# Patient Record
Sex: Male | Born: 1950 | Race: Black or African American | Hispanic: No | Marital: Married | Smoking: Never smoker
Health system: Southern US, Community
[De-identification: ages and names within clinical notes are randomized; demographics above are authoritative.]

## PROBLEM LIST (undated history)

## (undated) DIAGNOSIS — H35039 Hypertensive retinopathy, unspecified eye: Secondary | ICD-10-CM

## (undated) DIAGNOSIS — I10 Essential (primary) hypertension: Secondary | ICD-10-CM

## (undated) DIAGNOSIS — H269 Unspecified cataract: Secondary | ICD-10-CM

## (undated) DIAGNOSIS — I8290 Acute embolism and thrombosis of unspecified vein: Secondary | ICD-10-CM

## (undated) HISTORY — DX: Unspecified cataract: H26.9

## (undated) HISTORY — DX: Hypertensive retinopathy, unspecified eye: H35.039

## (undated) HISTORY — DX: Essential (primary) hypertension: I10

---

## 1999-08-08 ENCOUNTER — Ambulatory Visit (HOSPITAL_COMMUNITY): Admission: RE | Admit: 1999-08-08 | Discharge: 1999-08-08 | Payer: Self-pay | Admitting: Orthopaedic Surgery

## 2004-11-14 ENCOUNTER — Ambulatory Visit (HOSPITAL_COMMUNITY): Admission: RE | Admit: 2004-11-14 | Discharge: 2004-11-14 | Payer: Self-pay | Admitting: Internal Medicine

## 2006-01-29 ENCOUNTER — Ambulatory Visit: Payer: Self-pay | Admitting: Oncology

## 2006-02-22 LAB — CBC WITH DIFFERENTIAL/PLATELET
Basophils Absolute: 0 10*3/uL (ref 0.0–0.1)
EOS%: 2.1 % (ref 0.0–7.0)
Eosinophils Absolute: 0.1 10*3/uL (ref 0.0–0.5)
HGB: 13.1 g/dL (ref 13.0–17.1)
LYMPH%: 39 % (ref 14.0–48.0)
MCH: 26.4 pg — ABNORMAL LOW (ref 28.0–33.4)
MCV: 82.2 fL (ref 81.6–98.0)
MONO%: 9 % (ref 0.0–13.0)
NEUT#: 2.8 10*3/uL (ref 1.5–6.5)
NEUT%: 49.4 % (ref 40.0–75.0)
Platelets: 236 10*3/uL (ref 145–400)
RDW: 15.5 % — ABNORMAL HIGH (ref 11.2–14.6)

## 2006-03-03 LAB — BETA-2 GLYCOPROTEIN ANTIBODIES
Beta-2 Glyco I IgG: <4 U/mL (ref <20)
Beta-2-Glycoprotein I IgA: 34 U/mL (ref <10)
Beta-2-Glycoprotein I IgM: <4 U/mL (ref <10)

## 2006-03-03 LAB — FACTOR 5 LEIDEN

## 2006-03-03 LAB — LUPUS ANTICOAGULANT PANEL
DRVVT: 40.1 secs (ref 26.75–42.95)
PTT Lupus Anticoagulant: 39.8 secs (ref 30.5–43.1)

## 2006-03-03 LAB — PROTHROMBIN GENE MUTATION

## 2006-03-17 ENCOUNTER — Ambulatory Visit: Payer: Self-pay | Admitting: Oncology

## 2006-03-22 LAB — CBC & DIFF AND RETIC
BASO%: 0.5 % (ref 0.0–2.0)
Basophils Absolute: 0 10*3/uL (ref 0.0–0.1)
EOS%: 2.5 % (ref 0.0–7.0)
Eosinophils Absolute: 0.1 10*3/uL (ref 0.0–0.5)
HCT: 39.3 % (ref 38.7–49.9)
HGB: 12.7 g/dL — ABNORMAL LOW (ref 13.0–17.1)
IRF: 0.33 (ref 0.070–0.380)
LYMPH%: 43.2 % (ref 14.0–48.0)
MCH: 26.5 pg — ABNORMAL LOW (ref 28.0–33.4)
MCHC: 32.2 g/dL (ref 32.0–35.9)
MCV: 82.3 fL (ref 81.6–98.0)
MONO#: 0.4 10*3/uL (ref 0.1–0.9)
MONO%: 8.6 % (ref 0.0–13.0)
NEUT#: 2.2 10*3/uL (ref 1.5–6.5)
NEUT%: 45.2 % (ref 40.0–75.0)
Platelets: 230 10*3/uL (ref 145–400)
RBC: 4.78 10*6/uL (ref 4.20–5.71)
RDW: 14.8 % — ABNORMAL HIGH (ref 11.2–14.6)
RETIC #: 40.2 10*3/uL (ref 31.8–103.9)
Retic %: 0.8 % (ref 0.7–2.3)
WBC: 4.8 10*3/uL (ref 4.0–10.0)
lymph#: 2.1 10*3/uL (ref 0.9–3.3)

## 2006-03-22 LAB — CHCC SMEAR

## 2006-03-25 LAB — FERRITIN: Ferritin: 53 ng/mL (ref 22–322)

## 2006-03-25 LAB — BETA-2 GLYCOPROTEIN ANTIBODIES
Beta-2 Glyco I IgG: <4 U/mL (ref <20)
Beta-2-Glycoprotein I IgA: 6 U/mL (ref <10)
Beta-2-Glycoprotein I IgM: <4 U/mL (ref <10)

## 2006-04-29 LAB — CBC WITH DIFFERENTIAL/PLATELET
Basophils Absolute: 0 10*3/uL (ref 0.0–0.1)
Eosinophils Absolute: 0.1 10*3/uL (ref 0.0–0.5)
HGB: 13.1 g/dL (ref 13.0–17.1)
NEUT#: 2 10*3/uL (ref 1.5–6.5)
RBC: 4.94 10*6/uL (ref 4.20–5.71)
RDW: 14.7 % — ABNORMAL HIGH (ref 11.2–14.6)
WBC: 4.6 10*3/uL (ref 4.0–10.0)
lymph#: 2 10*3/uL (ref 0.9–3.3)

## 2006-04-29 LAB — CHCC SMEAR

## 2006-04-30 LAB — FERRITIN: Ferritin: 44 ng/mL (ref 22–322)

## 2006-05-06 LAB — LUPUS ANTICOAGULANT PANEL: PTT Lupus Anticoagulant: 36.1 secs — ABNORMAL LOW (ref 36.3–48.8)

## 2006-05-06 LAB — BETA-2 GLYCOPROTEIN ANTIBODIES
Beta-2 Glyco I IgG: 6 U/mL (ref <20)
Beta-2-Glycoprotein I IgA: 8 U/mL (ref <10)
Beta-2-Glycoprotein I IgM: <4 U/mL (ref <10)

## 2006-05-06 LAB — ANTITHROMBIN III: AntiThromb III Func: 101 % (ref 75–120)

## 2006-05-07 ENCOUNTER — Ambulatory Visit: Payer: Self-pay | Admitting: Oncology

## 2006-10-28 ENCOUNTER — Ambulatory Visit: Payer: Self-pay | Admitting: Oncology

## 2011-06-29 ENCOUNTER — Emergency Department (HOSPITAL_COMMUNITY): Payer: No Typology Code available for payment source

## 2011-06-29 ENCOUNTER — Emergency Department (HOSPITAL_COMMUNITY)
Admission: EM | Admit: 2011-06-29 | Discharge: 2011-06-29 | Disposition: A | Discharge status: Home / Self Care | Payer: No Typology Code available for payment source | Attending: Emergency Medicine | Admitting: Emergency Medicine

## 2011-06-29 ENCOUNTER — Encounter: Payer: Self-pay | Admitting: *Deleted

## 2011-06-29 DIAGNOSIS — M542 Cervicalgia: Secondary | ICD-10-CM | POA: Insufficient documentation

## 2011-06-29 DIAGNOSIS — Y92410 Unspecified street and highway as the place of occurrence of the external cause: Secondary | ICD-10-CM | POA: Insufficient documentation

## 2011-06-29 MED ORDER — KETOROLAC TROMETHAMINE 30 MG/ML IJ SOLN
30.0000 mg | Freq: Once | INTRAMUSCULAR | Status: AC
  Administered 2011-06-29: 30 mg via INTRAVENOUS
  Filled 2011-06-29: qty 1

## 2011-06-29 MED ORDER — OXYCODONE-ACETAMINOPHEN 5-325 MG PO TABS: 2.0000 | ORAL_TABLET | ORAL | Status: AC | PRN
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
## 2011-06-29 NOTE — ED Notes (Signed)
Pt c/o bilateral neck pain and L rib pain, R shoulder pain and bilateral hip pain. Pt ambulatory and in no acute distress.

## 2011-06-29 NOTE — ED Notes (Signed)
Dr Nino Parsley notified of elevated blood pressure,  Verbal order to obtain IV

## 2014-11-06 ENCOUNTER — Emergency Department (HOSPITAL_COMMUNITY): Payer: Self-pay

## 2014-11-06 ENCOUNTER — Inpatient Hospital Stay (HOSPITAL_COMMUNITY)
Admission: EM | Admit: 2014-11-06 | Discharge: 2014-11-08 | DRG: 580 | Disposition: A | Discharge status: Home / Self Care | Payer: Self-pay | Attending: Internal Medicine | Admitting: Internal Medicine

## 2014-11-06 ENCOUNTER — Encounter (HOSPITAL_COMMUNITY): Payer: Self-pay | Admitting: Emergency Medicine

## 2014-11-06 DIAGNOSIS — K921 Melena: Secondary | ICD-10-CM

## 2014-11-06 DIAGNOSIS — R55 Syncope and collapse: Secondary | ICD-10-CM | POA: Diagnosis present

## 2014-11-06 DIAGNOSIS — R195 Other fecal abnormalities: Secondary | ICD-10-CM | POA: Diagnosis present

## 2014-11-06 DIAGNOSIS — W3189XA Contact with other specified machinery, initial encounter: Secondary | ICD-10-CM

## 2014-11-06 DIAGNOSIS — D62 Acute posthemorrhagic anemia: Secondary | ICD-10-CM | POA: Diagnosis present

## 2014-11-06 DIAGNOSIS — E876 Hypokalemia: Secondary | ICD-10-CM | POA: Diagnosis present

## 2014-11-06 DIAGNOSIS — S61412A Laceration without foreign body of left hand, initial encounter: Principal | ICD-10-CM | POA: Diagnosis present

## 2014-11-06 DIAGNOSIS — I10 Essential (primary) hypertension: Secondary | ICD-10-CM | POA: Diagnosis present

## 2014-11-06 DIAGNOSIS — R030 Elevated blood-pressure reading, without diagnosis of hypertension: Secondary | ICD-10-CM

## 2014-11-06 DIAGNOSIS — K922 Gastrointestinal hemorrhage, unspecified: Secondary | ICD-10-CM | POA: Diagnosis present

## 2014-11-06 DIAGNOSIS — S61419A Laceration without foreign body of unspecified hand, initial encounter: Secondary | ICD-10-CM | POA: Diagnosis present

## 2014-11-06 DIAGNOSIS — IMO0001 Reserved for inherently not codable concepts without codable children: Secondary | ICD-10-CM | POA: Insufficient documentation

## 2014-11-06 DIAGNOSIS — I159 Secondary hypertension, unspecified: Secondary | ICD-10-CM

## 2014-11-06 DIAGNOSIS — I160 Hypertensive urgency: Secondary | ICD-10-CM | POA: Diagnosis present

## 2014-11-06 HISTORY — DX: Acute embolism and thrombosis of unspecified vein: I82.90

## 2014-11-06 LAB — CBC WITH DIFFERENTIAL/PLATELET
BASOS PCT: 0 % (ref 0–1)
Basophils Absolute: 0 10*3/uL (ref 0.0–0.1)
Eosinophils Absolute: 0.1 10*3/uL (ref 0.0–0.7)
Eosinophils Relative: 1 % (ref 0–5)
HCT: 37.6 % — ABNORMAL LOW (ref 39.0–52.0)
Hemoglobin: 11.8 g/dL — ABNORMAL LOW (ref 13.0–17.0)
Lymphocytes Relative: 32 % (ref 12–46)
Lymphs Abs: 2.3 10*3/uL (ref 0.7–4.0)
MCH: 26.1 pg (ref 26.0–34.0)
MCHC: 31.4 g/dL (ref 30.0–36.0)
MCV: 83.2 fL (ref 78.0–100.0)
MONOS PCT: 9 % (ref 3–12)
Monocytes Absolute: 0.6 10*3/uL (ref 0.1–1.0)
NEUTROS PCT: 57 % (ref 43–77)
Neutro Abs: 4 10*3/uL (ref 1.7–7.7)
PLATELETS: 189 10*3/uL (ref 150–400)
RBC: 4.52 MIL/uL (ref 4.22–5.81)
RDW: 14.8 % (ref 11.5–15.5)
WBC: 7 10*3/uL (ref 4.0–10.5)

## 2014-11-06 LAB — I-STAT TROPONIN, ED: Troponin i, poc: 0.02 ng/mL (ref 0.00–0.08)

## 2014-11-06 LAB — BASIC METABOLIC PANEL
Anion gap: 9 (ref 5–15)
BUN: 7 mg/dL (ref 6–23)
CO2: 26 mmol/L (ref 19–32)
Calcium: 8.5 mg/dL (ref 8.4–10.5)
Chloride: 103 mmol/L (ref 96–112)
Creatinine, Ser: 1.02 mg/dL (ref 0.50–1.35)
GFR calc Af Amer: 89 mL/min — ABNORMAL LOW (ref >90)
GFR calc non Af Amer: 77 mL/min — ABNORMAL LOW (ref >90)
GLUCOSE: 111 mg/dL — AB (ref 70–99)
Potassium: 3.2 mmol/L — ABNORMAL LOW (ref 3.5–5.1)
Sodium: 138 mmol/L (ref 135–145)

## 2014-11-06 LAB — I-STAT CHEM 8, ED
BUN: 10 mg/dL (ref 6–23)
CALCIUM ION: 1.18 mmol/L (ref 1.13–1.30)
Chloride: 101 mmol/L (ref 96–112)
Creatinine, Ser: 0.9 mg/dL (ref 0.50–1.35)
Glucose, Bld: 107 mg/dL — ABNORMAL HIGH (ref 70–99)
HCT: 43 % (ref 39.0–52.0)
Hemoglobin: 14.6 g/dL (ref 13.0–17.0)
Potassium: 3.7 mmol/L (ref 3.5–5.1)
Sodium: 141 mmol/L (ref 135–145)
TCO2: 24 mmol/L (ref 0–100)

## 2014-11-06 LAB — CBG MONITORING, ED: Glucose-Capillary: 102 mg/dL — ABNORMAL HIGH (ref 70–99)

## 2014-11-06 LAB — TROPONIN I: TROPONIN I: 0.03 ng/mL (ref <0.031)

## 2014-11-06 LAB — POC OCCULT BLOOD, ED: Fecal Occult Bld: POSITIVE — AB

## 2014-11-06 MED ORDER — SODIUM CHLORIDE 0.9 % IV SOLN
Freq: Once | INTRAVENOUS | Status: AC
  Administered 2014-11-06: 15:00:00 via INTRAVENOUS

## 2014-11-06 MED ORDER — PANTOPRAZOLE SODIUM 40 MG IV SOLR
40.0000 mg | Freq: Once | INTRAVENOUS | Status: AC
  Administered 2014-11-07: 40 mg via INTRAVENOUS
  Filled 2014-11-06: qty 40

## 2014-11-06 MED ORDER — BUPIVACAINE HCL 0.5 % IJ SOLN: 50.0000 mL | Freq: Once | INTRAMUSCULAR | Status: DC

## 2014-11-06 MED ORDER — CEFAZOLIN SODIUM 1-5 GM-% IV SOLN
1.0000 g | Freq: Once | INTRAVENOUS | Status: AC
  Administered 2014-11-06: 1 g via INTRAVENOUS
  Filled 2014-11-06: qty 50

## 2014-11-06 MED ORDER — HYDRALAZINE HCL 20 MG/ML IJ SOLN
10.0000 mg | Freq: Once | INTRAMUSCULAR | Status: AC
  Administered 2014-11-06: 10 mg via INTRAVENOUS
  Filled 2014-11-06: qty 1

## 2014-11-06 MED ORDER — HYDROCHLOROTHIAZIDE 25 MG PO TABS: 25.0000 mg | ORAL_TABLET | Freq: Every day | ORAL | Status: DC

## 2014-11-06 MED ORDER — BUPIVACAINE HCL 0.25 % IJ SOLN
30.0000 mL | Freq: Once | INTRAMUSCULAR | Status: AC
  Administered 2014-11-06: 30 mL
  Filled 2014-11-06: qty 30

## 2014-11-06 MED ORDER — LIDOCAINE HCL (PF) 1 % IJ SOLN
30.0000 mL | Freq: Once | INTRAMUSCULAR | Status: AC
  Administered 2014-11-06: 30 mL
  Filled 2014-11-06: qty 30

## 2014-11-06 MED ORDER — HYDROCHLOROTHIAZIDE 25 MG PO TABS
25.0000 mg | ORAL_TABLET | Freq: Every day | ORAL | Status: DC
  Administered 2014-11-06: 25 mg via ORAL
  Filled 2014-11-06: qty 1

## 2014-11-06 NOTE — ED Notes (Signed)
Maxie BetterB Buchanan, PA, in w/pt and sons.

## 2014-11-06 NOTE — ED Notes (Signed)
Dr Gramig at bedside. 

## 2014-11-06 NOTE — ED Provider Notes (Signed)
Wounds repaired by Dr. Amanda PeaGramig. He is given Keflex and norco for followup. Patient denies any history of high blood pressure. States he does not take high blood pressure medications. No signs of end organ damage on labs. Denies headache, chest pain, abdominal pain, shortness of breath. Instructed to keep blood pressure log and will start hydrochlorothiazide  Patient with near syncopal episode on attempted discharge. He became diaphoretic, hot and sweaty. CBG was 102. EKG was unchanged right bundle branch block. Troponin negative. He remained hypertensive throughout this episode. Labs were resent.  Hemoglobin has dropped 3 g since earlier today. This may be from his hand injury. However he is also guaiac positive. With patient's near syncope, drop in hemoglobin, abnormal EKG, will observe in hospital for further evaluation of near syncope and guaiac positive stools.  Brandon OctaveStephen Quartez Lagos, MD 11/06/14 [phone]

## 2014-11-06 NOTE — ED Provider Notes (Signed)
[facility] repair of complicated hand laceration. NVI distally, pt has no current complaints, bleeding controlled. Incidentily, pt has singificant HTN, but denies CP, SOB, dec UOP, headaches, visual changes, n/v, leg swelling. EKG with Bifasicular block (no prior for comparison), Trop not elevated, Cr nml. I do not feel he is having end organ damage. 10mg  IV hydralyzine given, pt will be started on HCTZ 12.5mg , instructed to est PCP care and keep BP log.   1. Hand laceration, left, initial encounter   2. Secondary hypertension, unspecified   3. Syncope, unspecified syncope type   4. Melena      Toy CookeyMegan Yecheskel Kurek, MD 11/07/14 2009

## 2014-11-06 NOTE — ED Notes (Signed)
Pt moved to C24 so may be placed on monitor.
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
## 2014-11-06 NOTE — ED Notes (Signed)
rancour asked pt put in discharge papers per dr. Amanda Peagramig request.
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
## 2014-11-06 NOTE — ED Notes (Addendum)
Upon standing pt up for D/c pt became very diaphoretic and stated he felt dizziness. CBG 102. ekg done and given to dr. Manus Gunningancour. HR-55 bp 125/77

## 2014-11-06 NOTE — ED Notes (Signed)
Dr Micheline Mazeocherty in w/pt.

## 2014-11-06 NOTE — Progress Notes (Signed)
Orthopedic Tech Progress Note Patient Details:  Brandon Perkins 03/17/1951 [id number] Assisted PA with surgical repair of lacerations to Lt. hand, assisted with application of fiberglass posterior short arm splint.  Ortho Devices Type of Ortho Device: Post (short arm) splint Ortho Device/Splint Location: LUE Ortho Device/Splint Interventions: Application   Lesle ChrisGilliland, Yaritzi Craun L 11/06/2014, 7:20 PM

## 2014-11-06 NOTE — ED Notes (Signed)
Pt complaint of left hand laceration post saw accident. Left radial pulse moderate.

## 2014-11-06 NOTE — H&P (Signed)
[facility] History and Physical  Brandon Perkins WUJ:[id number] DOB: 07/21/1951 DOA: 11/06/2014  Referring physician: ED physician PCP: No primary care provider on file.  Specialists:   Chief Complaint: Left hand laceration, dizziness, elevated blood pressure, positive FOBT.  HPI: Brandon Perkins is a 64 y.o. male without significant past medical history, who presents with left hand laceration, dizziness, elevated blood pressure, positive FOBT.  Patient initially presented to the emergency room because of laceration to the dorsal aspect of the hand and dorsal aspect of the index finger while working with a grinder. Orthopedic surgeon was consulted, debridement was performed. When patient was about to be discharged home from emergency room, he developed dizziness and lightheadedness, but did not pass out. No falling. The episode lasted for about 15-30 seconds. FOBT was checked, which is positive. Patient was also found to have significant elevated blood pressure at 254/99. Patient reports that he had colonoscopy in the past which was normal. He did not notice any bloody stool or dark stool at home. He states that his father died of colon cancer at age of 80s.  Patient denies fever, chills, headaches, cough, chest pain, SOB, abdominal pain, diarrhea, constipation, dysuria, urgency, frequency, hematuria, skin rashes, joint pain or leg swelling. No unilateral weakness, numbness or tingling sensations. No vision change or hearing loss.  In ED, patient was found to have elevated blood pressure at 254/99, negative troponin, positive FOBT, hypokalemia. EKG showed bifascicular blockage. Patient is admitted to inpatient for further evaluation and treatment.  Review of Systems: As presented in the history of presenting illness, rest negative.  Where does patient live? At home Can patient participate in ADLs? Yes  Allergy:  Allergies  Allergen Reactions  . Aspirin     When takes a lot of aspirin--  reaction unknown    Past Medical History  Diagnosis Date  . Blood clot in vein     left arm vein blood clot, not know the detail, treated with ASA per pt    History reviewed. No pertinent past surgical history.  Social History:  reports that he has never smoked. He does not have any smokeless tobacco history on file. He reports that he does not drink alcohol or use illicit drugs.  Family History:  Family History  Problem Relation Age of Onset  . Dementia Mother   . Colon cancer Father   . Stroke Sister     Sister had brain aneurysm     Prior to Admission medications   Medication Sig Start Date End Date Taking? Authorizing Provider  hydrochlorothiazide (HYDRODIURIL) 25 MG tablet Take 1 tablet (25 mg total) by mouth daily. 11/06/14   Toy Cookey, MD  hydrochlorothiazide (HYDRODIURIL) 25 MG tablet Take 1 tablet (25 mg total) by mouth daily. 11/06/14   Glynn Octave, MD    Physical Exam: Filed Vitals:   11/06/14 2300 11/07/14 0000 11/07/14 0032 11/07/14 0126  BP: 198/78 189/80    Pulse: 59 58 58 56  Temp:    97.9 F (36.6 C)  TempSrc:    Oral  Resp: Height:     (1.88 m)  Weight:    87.9 kg (193 lb 12.6 oz)  SpO2: 100% 98% 98% 98%   General: Not in acute distress HEENT:       Eyes: PERRL, EOMI, no scleral icterus       ENT: No discharge from the ears and nose, no pharynx injection, no tonsillar enlargement.  Neck: No JVD, no bruit, no mass felt. Cardiac: S1/S2, RRR, No murmurs, No gallops or rubs Pulm: Good air movement bilaterally. Clear to auscultation bilaterally. No rales, wheezing, rhonchi or rubs. Abd: Soft, nondistended, nontender, no rebound pain, no organomegaly, BS present Ext: No edema bilaterally. 2+DP/PT pulse bilaterally Musculoskeletal: No joint deformities, erythema, or stiffness, ROM full Skin: No rashes.  Neuro: Alert and oriented X3, cranial nerves II-XII grossly intact, muscle strength 5/5 in all extremeties, sensation to  light touch intact. Brachial reflex 2+ bilaterally. Knee reflex 1+ bilaterally. Negative Babinski's sign. Normal finger to nose test. Psych: Patient is not psychotic, no suicidal or hemocidal ideation.  Labs on Admission:  Basic Metabolic Panel:  Recent Labs Lab 11/06/14 1457 11/06/14 2127  NA 141 138  K 3.7 3.2*  CL 101 103  CO2  --  26  GLUCOSE 107* 111*  BUN 10 7  CREATININE 0.90 1.02  CALCIUM  --  8.5   Liver Function Tests: No results for input(s): AST, ALT, ALKPHOS, BILITOT, PROT, ALBUMIN in the last 168 hours. No results for input(s): LIPASE, AMYLASE in the last 168 hours. No results for input(s): AMMONIA in the last 168 hours. CBC:  Recent Labs Lab 11/06/14 1457 11/06/14 2127 11/07/14 0223  WBC  --  7.0 7.1  NEUTROABS  --  4.0  --   HGB 14.6 11.8* 12.2*  HCT 43.0 37.6* 38.7*  MCV  --  83.2 82.3  PLT  --  189 197   Cardiac Enzymes:  Recent Labs Lab 11/06/14 2127 11/07/14 0223  TROPONINI 0.03 0.04*    BNP (last 3 results)  Recent Labs  11/07/14 0223  BNP 83.5    ProBNP (last 3 results) No results for input(s): PROBNP in the last 8760 hours.  CBG:  Recent Labs Lab 11/06/14 2030  GLUCAP 102*    Radiological Exams on Admission: Dg Hand Complete Left  11/06/2014   CLINICAL DATA:  Left hand laceration after saw accident. Initial encounter.  EXAM: LEFT HAND - COMPLETE 3+ VIEW  COMPARISON:  None.  FINDINGS: There is no evidence of fracture or dislocation. There is no evidence of arthropathy or other focal bone abnormality. No radiopaque foreign body is noted.  IMPRESSION: Normal left hand.   Electronically Signed   By: Lupita Raider, M.D.   On: 11/06/2014 10:48    EKG: Independently reviewed.  EKG showed bifascicular blockage.  Assessment/Plan Principal Problem:   Hypertensive urgency Active Problems:   Syncope, near   GI bleeding   Hand laceration   Hypokalemia  Hypertensive urgency: Patient does not carry diagnosis of hypertension,  but he had several episode of hypertension in the past. Currently he is not taking any medications at home. His blood pressure is significantly elevated at 254/99 emergency room. Patient does not have chest pain, renal function normal.  -will admit to tele bed -Nitroglycerin when necessary -Patient is allergic to aspirin -Hydrochlorothiazide 25 mg daily, lisinopril 20 mg daily, -Hydralazine when necessary -Troponin 3 -2-D echo given abnormal EKG and near syncope  GI bleeding: Patient hemoglobin drop from 14.6 to 11.8, which is likely due to blood loss from hand surgery, but pt has +FOBT, indicating possible GI bleeding. He has family history of colon cancer, his father died of colon cancer at an old age. -CBC every 6 hours -INR/PTT -Type and screen -protonix IV -may follow up with GI for colonoscopy.  Near syncope: Patient had dizziness and lightheadedness, but did not pass out, no fall. Likely  due to significantly elevated blood pressure. Patient does not have signs for stroke. No focal neurological findings on examination. -Control blood pressure as above -Check orthostatic signs -2d echo  Hypokalemia: -Repleted  Hand laceration: Post status of debriment. - Keflex for 10 days per surgeon -Percocet for pain -Vitamine C and Colace   DVT ppx: SQ Heparin      Code Status: Full code Family Communication: None at bed side.   Disposition Plan: Admit to inpatient   Date of Service 11/07/2014    Lorretta HarpIU, Safal Halderman [facility] Pager [phone]  If 7PM-7AM, please contact night-coverage [email or web address] Password [email or web address] 11/07/2014, 5:00 AM

## 2014-11-06 NOTE — ED Notes (Signed)
Brett Canalesoy, Ortho Tech, aware of need for short arm splint for left arm as per B Wynona NeatBuchanan, [place].

## 2014-11-07 ENCOUNTER — Encounter (HOSPITAL_COMMUNITY): Payer: Self-pay | Admitting: Internal Medicine

## 2014-11-07 DIAGNOSIS — I160 Hypertensive urgency: Secondary | ICD-10-CM | POA: Diagnosis present

## 2014-11-07 DIAGNOSIS — S61412D Laceration without foreign body of left hand, subsequent encounter: Secondary | ICD-10-CM

## 2014-11-07 DIAGNOSIS — E876 Hypokalemia: Secondary | ICD-10-CM | POA: Diagnosis present

## 2014-11-07 LAB — CBC
HCT: 38.7 % — ABNORMAL LOW (ref 39.0–52.0)
HCT: 39.1 % (ref 39.0–52.0)
HEMOGLOBIN: 12.4 g/dL — AB (ref 13.0–17.0)
Hemoglobin: 12.2 g/dL — ABNORMAL LOW (ref 13.0–17.0)
MCH: 26 pg (ref 26.0–34.0)
MCH: 25.9 pg — ABNORMAL LOW (ref 26.0–34.0)
MCHC: 31.5 g/dL (ref 30.0–36.0)
MCHC: 31.7 g/dL (ref 30.0–36.0)
MCV: 82.3 fL (ref 78.0–100.0)
MCV: 81.6 fL (ref 78.0–100.0)
PLATELETS: 206 10*3/uL (ref 150–400)
Platelets: 197 10*3/uL (ref 150–400)
RBC: 4.7 MIL/uL (ref 4.22–5.81)
RBC: 4.79 MIL/uL (ref 4.22–5.81)
RDW: 14.6 % (ref 11.5–15.5)
RDW: 14.7 % (ref 11.5–15.5)
WBC: 7.1 10*3/uL (ref 4.0–10.5)
WBC: 6.7 10*3/uL (ref 4.0–10.5)

## 2014-11-07 LAB — COMPREHENSIVE METABOLIC PANEL
ALT: 15 U/L (ref 0–53)
ANION GAP: 9 (ref 5–15)
AST: 23 U/L (ref 0–37)
Albumin: 3.2 g/dL — ABNORMAL LOW (ref 3.5–5.2)
Alkaline Phosphatase: 53 U/L (ref 39–117)
BUN: 8 mg/dL (ref 6–23)
CO2: 27 mmol/L (ref 19–32)
Calcium: 8.8 mg/dL (ref 8.4–10.5)
Chloride: 99 mmol/L (ref 96–112)
Creatinine, Ser: 1.01 mg/dL (ref 0.50–1.35)
GFR calc Af Amer: >90 mL/min (ref >90)
GFR, EST NON AFRICAN AMERICAN: 78 mL/min — AB (ref >90)
GLUCOSE: 113 mg/dL — AB (ref 70–99)
POTASSIUM: 3.8 mmol/L (ref 3.5–5.1)
Sodium: 135 mmol/L (ref 135–145)
Total Bilirubin: 1.7 mg/dL — ABNORMAL HIGH (ref 0.3–1.2)
Total Protein: 6.9 g/dL (ref 6.0–8.3)

## 2014-11-07 LAB — TYPE AND SCREEN
ABO/RH(D): O POS
ANTIBODY SCREEN: NEGATIVE

## 2014-11-07 LAB — GLUCOSE, CAPILLARY: GLUCOSE-CAPILLARY: 111 mg/dL — AB (ref 70–99)

## 2014-11-07 LAB — ABO/RH: ABO/RH(D): O POS

## 2014-11-07 LAB — LIPID PANEL
CHOLESTEROL: 154 mg/dL (ref 0–200)
HDL: 43 mg/dL (ref >39)
LDL Cholesterol: 104 mg/dL — ABNORMAL HIGH (ref 0–99)
Total CHOL/HDL Ratio: 3.6 RATIO
Triglycerides: 36 mg/dL (ref <150)
VLDL: 7 mg/dL (ref 0–40)

## 2014-11-07 LAB — TSH: TSH: 0.697 u[IU]/mL (ref 0.350–4.500)

## 2014-11-07 LAB — TROPONIN I
TROPONIN I: 0.04 ng/mL — AB (ref <0.031)
TROPONIN I: 0.03 ng/mL (ref <0.031)
Troponin I: 0.04 ng/mL — ABNORMAL HIGH (ref <0.031)

## 2014-11-07 LAB — PROTIME-INR
INR: 1.11 (ref 0.00–1.49)
Prothrombin Time: 14.4 seconds (ref 11.6–15.2)

## 2014-11-07 LAB — BRAIN NATRIURETIC PEPTIDE: B Natriuretic Peptide: 83.5 pg/mL (ref 0.0–100.0)

## 2014-11-07 LAB — APTT: aPTT: 27 seconds (ref 24–37)

## 2014-11-07 MED ORDER — ACETAMINOPHEN 650 MG RE SUPP: 650.0000 mg | Freq: Four times a day (QID) | RECTAL | Status: DC | PRN

## 2014-11-07 MED ORDER — ACETAMINOPHEN 325 MG PO TABS: 650.0000 mg | ORAL_TABLET | Freq: Four times a day (QID) | ORAL | Status: DC | PRN

## 2014-11-07 MED ORDER — DOCUSATE SODIUM 100 MG PO CAPS: 100.0000 mg | ORAL_CAPSULE | Freq: Every day | ORAL | Status: DC | PRN

## 2014-11-07 MED ORDER — POTASSIUM CHLORIDE CRYS ER 20 MEQ PO TBCR
40.0000 meq | EXTENDED_RELEASE_TABLET | Freq: Once | ORAL | Status: AC
  Administered 2014-11-07: 40 meq via ORAL
  Filled 2014-11-07: qty 2

## 2014-11-07 MED ORDER — HYDROCHLOROTHIAZIDE 25 MG PO TABS
25.0000 mg | ORAL_TABLET | Freq: Every day | ORAL | Status: DC
  Administered 2014-11-07 – 2014-11-08 (×2): 25 mg via ORAL
  Filled 2014-11-07 (×2): qty 1

## 2014-11-07 MED ORDER — VITAMIN C 500 MG PO TABS
1000.0000 mg | ORAL_TABLET | Freq: Every day | ORAL | Status: DC
  Administered 2014-11-07 – 2014-11-08 (×2): 1000 mg via ORAL
  Filled 2014-11-07 (×2): qty 2

## 2014-11-07 MED ORDER — OXYCODONE-ACETAMINOPHEN 5-325 MG PO TABS: 1.0000 | ORAL_TABLET | ORAL | Status: DC | PRN

## 2014-11-07 MED ORDER — SODIUM CHLORIDE 0.9 % IV SOLN
INTRAVENOUS | Status: DC
  Administered 2014-11-07: 50 mL via INTRAVENOUS

## 2014-11-07 MED ORDER — LISINOPRIL 20 MG PO TABS
20.0000 mg | ORAL_TABLET | Freq: Every day | ORAL | Status: DC
  Administered 2014-11-07 – 2014-11-08 (×2): 20 mg via ORAL
  Filled 2014-11-07 (×2): qty 1

## 2014-11-07 MED ORDER — AMLODIPINE BESYLATE 10 MG PO TABS
10.0000 mg | ORAL_TABLET | Freq: Every day | ORAL | Status: DC
  Administered 2014-11-07 – 2014-11-08 (×2): 10 mg via ORAL
  Filled 2014-11-07 (×2): qty 1

## 2014-11-07 MED ORDER — SODIUM CHLORIDE 0.9 % IJ SOLN
3.0000 mL | Freq: Two times a day (BID) | INTRAMUSCULAR | Status: DC
  Administered 2014-11-07 – 2014-11-08 (×3): 3 mL via INTRAVENOUS

## 2014-11-07 MED ORDER — ONDANSETRON HCL 4 MG PO TABS: 4.0000 mg | ORAL_TABLET | Freq: Four times a day (QID) | ORAL | Status: DC | PRN

## 2014-11-07 MED ORDER — PANTOPRAZOLE SODIUM 40 MG IV SOLR
40.0000 mg | INTRAVENOUS | Status: DC
  Administered 2014-11-07 (×2): 40 mg via INTRAVENOUS
  Filled 2014-11-07 (×3): qty 40

## 2014-11-07 MED ORDER — ONDANSETRON HCL 4 MG/2ML IJ SOLN: 4.0000 mg | Freq: Four times a day (QID) | INTRAMUSCULAR | Status: DC | PRN

## 2014-11-07 MED ORDER — ALUM & MAG HYDROXIDE-SIMETH 200-200-20 MG/5ML PO SUSP: 30.0000 mL | Freq: Four times a day (QID) | ORAL | Status: DC | PRN

## 2014-11-07 MED ORDER — CEPHALEXIN 500 MG PO CAPS
500.0000 mg | ORAL_CAPSULE | Freq: Four times a day (QID) | ORAL | Status: DC
  Administered 2014-11-07 – 2014-11-08 (×7): 500 mg via ORAL
  Filled 2014-11-07 (×10): qty 1

## 2014-11-07 MED ORDER — NITROGLYCERIN 0.4 MG SL SUBL: 0.4000 mg | SUBLINGUAL_TABLET | SUBLINGUAL | Status: DC | PRN

## 2014-11-07 NOTE — ED Notes (Signed)
Patient stood and ambulated to wheelchair without incident. After using restroom, patient ambulated from restroom and got back into bed without difficulty. Patient endorses no feelings similar to earlier episode. No gross blood in stool.

## 2014-11-07 NOTE — Progress Notes (Signed)
[facility] PROGRESS NOTE  Brandon Perkins [id number]:[id number] DOB: 06/20/1951 DOA: 11/06/2014 PCP: No primary care provider on file.  Assessment/Plan: 1. Accelerated HTN -Bp improved now still uncontrolled -continue HCTZ, add norvasc -check ECHO  2. ACute blood loss anemia -due to large laceration of hand, pt reports profuse bleeding prior to arrival in ED -s/p repair, monitor Hb -hemoccult trace positive, normal colonsocopy within 5years per Dr.Hung, recommended outpatient FU  3. Dizziness/neear syncope -due to blood loss -FU ECHO, minimally elevated troponin, likely demand -start ASA and low dose BB at discharge  4. Hypokalemia: -Repleted  5. Hand laceration: Post status of repair and debriment. - Keflex for 10 days per surgeon -Percocet for pain -Vitamine C and Colace  Code Status: Full Code Family Communication: none at bedside (indicate person spoken with, relationship, and if by phone, the number) Disposition Plan: home tomorrow   Consultants:  Ortho  Procedures:  Repair and debridement of LUE laceration  HPI/Subjective: Feels better, no complaints  Objective: Filed Vitals:   11/07/14 0540  BP: 177/93  Pulse: 57  Temp: 98.2 F (36.8 C)  Resp: 17    Intake/Output Summary (Last 24 hours) at 11/07/14 1103 Last data filed at 11/07/14 0800  Gross per 24 hour  Intake      0 ml  Output    500 ml  Net   -500 ml   Filed Weights   11/07/14 0126  Weight: 87.9 kg (193 lb 12.6 oz)    Exam:   General:  AAOx3, no distress  Cardiovascular: S1S2/RRR  Respiratory: CTAB  Abdomen: soft, Nt, BS present  Musculoskeletal: LUE with dressing on  Data Reviewed: Basic Metabolic Panel:  Recent Labs Lab 11/06/14 1457 11/06/14 2127 11/07/14 0800  NA 141 138 135  K 3.7 3.2* 3.8  CL 101 103 99  CO2  --  26 27  GLUCOSE 107* 111* 113*  BUN 10 7 8   CREATININE 0.90 1.02 1.01  CALCIUM  --  8.5 8.8   Liver Function Tests:  Recent Labs Lab  11/07/14 0800  AST 23  ALT 15  ALKPHOS 53  BILITOT 1.7*  PROT 6.9  ALBUMIN 3.2*   No results for input(s): LIPASE, AMYLASE in the last 168 hours. No results for input(s): AMMONIA in the last 168 hours. CBC:  Recent Labs Lab 11/06/14 1457 11/06/14 2127 11/07/14 0223 11/07/14 0800  WBC  --  7.0 7.1 6.7  NEUTROABS  --  4.0  --   --   HGB 14.6 11.8* 12.2* 12.4*  HCT 43.0 37.6* 38.7* 39.1  MCV  --  83.2 82.3 81.6  PLT  --  189 197 206   Cardiac Enzymes:  Recent Labs Lab 11/06/14 2127 11/07/14 0223 11/07/14 0800  TROPONINI 0.03 0.04* 0.04*   BNP (last 3 results)  Recent Labs  11/07/14 0223  BNP 83.5    ProBNP (last 3 results) No results for input(s): PROBNP in the last 8760 hours.  CBG:  Recent Labs Lab 11/06/14 2030 11/07/14 0625  GLUCAP 102* 111*    No results found for this or any previous visit (from the past 240 hour(s)).   Studies: Dg Hand Complete Left  11/06/2014   CLINICAL DATA:  Left hand laceration after saw accident. Initial encounter.  EXAM: LEFT HAND - COMPLETE 3+ VIEW  COMPARISON:  None.  FINDINGS: There is no evidence of fracture or dislocation. There is no evidence of arthropathy or other focal bone abnormality. No radiopaque foreign body is noted.  IMPRESSION:  Normal left hand.   Electronically Signed   By: Lupita Raider, M.D.   On: 11/06/2014 10:48    Scheduled Meds: . amLODipine  10 mg Oral Daily  . cephALEXin  500 mg Oral 4 times per day  . hydrochlorothiazide  25 mg Oral Daily  . lisinopril  20 mg Oral Daily  . pantoprazole (PROTONIX) IV  40 mg Intravenous Q24H  . sodium chloride  3 mL Intravenous Q12H  . vitamin C  1,000 mg Oral Daily   Continuous Infusions:  Antibiotics Given (last 72 hours)    Date/Time Action Medication Dose   11/07/14 0227 Given   cephALEXin (KEFLEX) capsule 500 mg 500 mg   11/07/14 0545 Given   cephALEXin (KEFLEX) capsule 500 mg 500 mg   11/07/14 1055 Given   cephALEXin (KEFLEX) capsule 500 mg 500  mg      Principal Problem:   Hypertensive urgency Active Problems:   Syncope, near   GI bleeding   Hand laceration   Hypokalemia    Time spent:    [facility]  [facility] Pager [phone]. If 7PM-7AM, please contact night-coverage at [email or web address], password [facility] 11/07/2014, 11:03 AM  LOS: 1 day

## 2014-11-08 DIAGNOSIS — R55 Syncope and collapse: Secondary | ICD-10-CM

## 2014-11-08 LAB — CBC
HCT: 36.5 % — ABNORMAL LOW (ref 39.0–52.0)
HEMOGLOBIN: 11.6 g/dL — AB (ref 13.0–17.0)
MCH: 26 pg (ref 26.0–34.0)
MCHC: 31.8 g/dL (ref 30.0–36.0)
MCV: 81.7 fL (ref 78.0–100.0)
Platelets: 210 10*3/uL (ref 150–400)
RBC: 4.47 MIL/uL (ref 4.22–5.81)
RDW: 14.8 % (ref 11.5–15.5)
WBC: 6.1 10*3/uL (ref 4.0–10.5)

## 2014-11-08 LAB — HEMOGLOBIN A1C
Hgb A1c MFr Bld: 6.5 % — ABNORMAL HIGH (ref 4.8–5.6)
Mean Plasma Glucose: 140 mg/dL

## 2014-11-08 LAB — GLUCOSE, CAPILLARY
GLUCOSE-CAPILLARY: 120 mg/dL — AB (ref 70–99)
Glucose-Capillary: 106 mg/dL — ABNORMAL HIGH (ref 70–99)

## 2014-11-08 MED ORDER — OXYCODONE-ACETAMINOPHEN 5-325 MG PO TABS: 1.0000 | ORAL_TABLET | ORAL | Status: DC | PRN

## 2014-11-08 MED ORDER — PANTOPRAZOLE SODIUM 40 MG PO TBEC
40.0000 mg | DELAYED_RELEASE_TABLET | Freq: Every day | ORAL | Status: DC
  Administered 2014-11-08: 40 mg via ORAL

## 2014-11-08 MED ORDER — CEPHALEXIN 500 MG PO CAPS: 500.0000 mg | ORAL_CAPSULE | Freq: Four times a day (QID) | ORAL | Status: DC

## 2014-11-08 MED ORDER — AMLODIPINE BESYLATE 10 MG PO TABS: 10.0000 mg | ORAL_TABLET | Freq: Every day | ORAL | Status: DC

## 2014-11-08 MED ORDER — HYDROCHLOROTHIAZIDE 25 MG PO TABS: 25.0000 mg | ORAL_TABLET | Freq: Every day | ORAL | Status: DC

## 2014-11-08 MED ORDER — ASPIRIN EC 81 MG PO TBEC: 81.0000 mg | DELAYED_RELEASE_TABLET | Freq: Every day | ORAL | Status: DC

## 2014-11-08 NOTE — Discharge Instructions (Signed)
How to Take Your Blood Pressure Take the antibiotics and follow up with Dr. Amanda PeaGramig as scheduled. Take the blood pressure medication as prescribed. Followup with your doctor. Return to the ED if you develop new or worsening symptoms. HOW DO I GET A BLOOD PRESSURE MACHINE?  You can buy an electronic home blood pressure machine at your local pharmacy. Insurance will sometimes cover the cost if you have a prescription.  Ask your doctor what type of machine is best for you. There are different machines for your arm and your wrist.  If you decide to buy a machine to check your blood pressure on your arm, first check the size of your arm so you can buy the right size cuff. To check the size of your arm:   Use a measuring tape that shows both inches and centimeters.   Wrap the measuring tape around the upper-middle part of your arm. You may need someone to help you measure.   Write down your arm measurement in both inches and centimeters.   To measure your blood pressure correctly, it is important to have the right size cuff.   If your arm is up to 13 inches (up to 34 centimeters), get an adult cuff size.  If your arm is 13 to 17 inches (35 to 44 centimeters), get a large adult cuff size.    If your arm is 17 to 20 inches (45 to 52 centimeters), get an adult thigh cuff.  WHAT DO THE NUMBERS MEAN?   There are two numbers that make up your blood pressure. For example: 120/80.  The first number (120 in our example) is called the "systolic pressure." It is a measure of the pressure in your blood vessels when your heart is pumping blood.  The second number (80 in our example) is called the "diastolic pressure." It is a measure of the pressure in your blood vessels when your heart is resting between beats.  Your doctor will tell you what your blood pressure should be. WHAT SHOULD I DO BEFORE I CHECK MY BLOOD PRESSURE?   Try to rest or relax for at least 30 minutes before you check your  blood pressure.  Do not smoke.  Do not have any drinks with caffeine, such as:  Soda.  Coffee.  Tea.  Check your blood pressure in a quiet room.  Sit down and stretch out your arm on a table. Keep your arm at about the level of your heart. Let your arm relax.  Make sure that your legs are not crossed. HOW DO I CHECK MY BLOOD PRESSURE?  Follow the directions that came with your machine.  Make sure you remove any tight-fighting clothing from your arm or wrist. Wrap the cuff around your upper arm or wrist. You should be able to fit a finger between the cuff and your arm. If you cannot fit a finger between the cuff and your arm, it is too tight and should be removed and rewrapped.  Some units require you to manually pump up the arm cuff.  Automatic units inflate the cuff when you press a button.  Cuff deflation is automatic in both models. After the cuff is inflated, the unit measures your blood   Hydrochlorothiazide, HCTZ capsules or tablets What is this medicine? HYDROCHLOROTHIAZIDE (hye droe klor oh THYE a zide) is a diuretic. It increases the amount of urine passed, which causes the body to lose salt and water. This medicine is used to treat high blood pressure.  It is also reduces the swelling and water retention caused by various medical conditions, such as heart, liver, or kidney disease. This medicine may be used for other purposes; ask your health care provider or pharmacist if you have questions. COMMON BRAND NAME(S): Esidrix, Ezide, HydroDIURIL, Microzide, Oretic, Zide What should I tell my health care provider before I take this medicine? They need to know if you have any of these conditions: -diabetes -gout -immune system problems, like lupus -kidney disease or kidney stones -liver disease -pancreatitis -small amount of urine or difficulty passing urine -an unusual or allergic reaction to hydrochlorothiazide, sulfa drugs, other medicines, foods, dyes, or  preservatives -pregnant or trying to get pregnant -breast-feeding How should I use this medicine? Take this medicine by mouth with a glass of water. Follow the directions on the prescription label. Take your medicine at regular intervals. Remember that you will need to pass urine frequently after taking this medicine. Do not take your doses at a time of day that will cause you problems. Do not stop taking your medicine unless your doctor tells you to. Talk to your pediatrician regarding the use of this medicine in children. Special care may be needed. Overdosage: If you think you have taken too much of this medicine contact a poison control center or emergency room at once. NOTE: This medicine is only for you. Do not share this medicine with others. What if I miss a dose? If you miss a dose, take it as soon as you can. If it is almost time for your next dose, take only that dose. Do not take double or extra doses. What may interact with this medicine? -cholestyramine -colestipol -digoxin -dofetilide -lithium -medicines for blood pressure -medicines for diabetes -medicines that relax muscles for surgery -other diuretics -steroid medicines like prednisone or cortisone This list may not describe all possible interactions. Give your health care provider a list of all the medicines, herbs, non-prescription drugs, or dietary supplements you use. Also tell them if you smoke, drink alcohol, or use illegal drugs. Some items may interact with your medicine. What should I watch for while using this medicine? Visit your doctor or health care professional for regular checks on your progress. Check your blood pressure as directed. Ask your doctor or health care professional what your blood pressure should be and when you should contact him or her. You may need to be on a special diet while taking this medicine. Ask your doctor. Check with your doctor or health care professional if you get an attack of  severe diarrhea, nausea and vomiting, or if you sweat a lot. The loss of too much body fluid can make it dangerous for you to take this medicine. You may get drowsy or dizzy. Do not drive, use machinery, or do anything that needs mental alertness until you know how this medicine affects you. Do not stand or sit up quickly, especially if you are an older patient. This reduces the risk of dizzy or fainting spells. Alcohol may interfere with the effect of this medicine. Avoid alcoholic drinks. This medicine may affect your blood sugar level. If you have diabetes, check with your doctor or health care professional before changing the dose of your diabetic medicine. This medicine can make you more sensitive to the sun. Keep out of the sun. If you cannot avoid being in the sun, wear protective clothing and use sunscreen. Do not use sun lamps or tanning beds/booths. What side effects may I notice from receiving this  medicine? Side effects that you should report to your doctor or health care professional as soon as possible: -allergic reactions such as skin rash or itching, hives, swelling of the lips, mouth, tongue, or throat -changes in vision -chest pain -eye pain -fast or irregular heartbeat -feeling faint or lightheaded, falls -gout attack -muscle pain or cramps -pain or difficulty when passing urine -pain, tingling, numbness in the hands or feet -redness, blistering, peeling or loosening of the skin, including inside the mouth -unusually weak or tired Side effects that usually do not require medical attention (report to your doctor or health care professional if they continue or are bothersome): -change in sex drive or performance -dry mouth -headache -stomach upset This list may not describe all possible side effects. Call your doctor for medical advice about side effects. You may report side effects to FDA at 1-800-FDA-1088. Where should I keep my medicine? Keep out of the reach of  children. Store at room temperature between 15 and 30 degrees C (59 and 86 degrees F). Do not freeze. Protect from light and moisture. Keep container closed tightly. Throw away any unused medicine after the expiration date. NOTE: This sheet is a summary. It may not cover all possible information. If you have questions about this medicine, talk to your doctor, pharmacist, or health care provider.  2015, [employer]. (2010-03-14 12:57:37)   Lisinopril tablets What is this medicine? LISINOPRIL (lyse IN oh pril) is an ACE inhibitor. This medicine is used to treat high blood pressure and heart failure. It is also used to protect the heart immediately after a heart attack. This medicine may be used for other purposes; ask your health care provider or pharmacist if you have questions. COMMON BRAND NAME(S): Prinivil, Zestril What should I tell my health care provider before I take this medicine? They need to know if you have any of these conditions: -diabetes -heart or blood vessel disease -immune system disease like lupus or scleroderma -kidney disease -low blood pressure -previous swelling of the tongue, face, or lips with difficulty breathing, difficulty swallowing, hoarseness, or tightening of the throat -an unusual or allergic reaction to lisinopril, other ACE inhibitors, insect venom, foods, dyes, or preservatives -pregnant or trying to get pregnant -breast-feeding How should I use this medicine? Take this medicine by mouth with a glass of water. Follow the directions on your prescription label. You may take this medicine with or without food. Take your medicine at regular intervals. Do not stop taking this medicine except on the advice of your doctor or health care professional. Talk to your pediatrician regarding the use of this medicine in children. Special care may be needed. While this drug may be prescribed for children as young as 2 years of age for selected conditions,  precautions do apply. Overdosage: If you think you have taken too much of this medicine contact a poison control center or emergency room at once. NOTE: This medicine is only for you. Do not share this medicine with others. What if I miss a dose? If you miss a dose, take it as soon as you can. If it is almost time for your next dose, take only that dose. Do not take double or extra doses. What may interact with this medicine? -diuretics -lithium -NSAIDs, medicines for pain and inflammation, like ibuprofen or naproxen -over-the-counter herbal supplements like hawthorn -potassium salts or potassium supplements -salt substitutes This list may not describe all possible interactions. Give your health care provider a list of all the medicines,  herbs, non-prescription drugs, or dietary supplements you use. Also tell them if you smoke, drink alcohol, or use illegal drugs. Some items may interact with your medicine. What should I watch for while using this medicine? Visit your doctor or health care professional for regular check ups. Check your blood pressure as directed. Ask your doctor what your blood pressure should be, and when you should contact him or her. Call your doctor or health care professional if you notice an irregular or fast heart beat. Women should inform their doctor if they wish to become pregnant or think they might be pregnant. There is a potential for serious side effects to an unborn child. Talk to your health care professional or pharmacist for more information. Check with your doctor or health care professional if you get an attack of severe diarrhea, nausea and vomiting, or if you sweat a lot. The loss of too much body fluid can make it dangerous for you to take this medicine. You may get drowsy or dizzy. Do not drive, use machinery, or do anything that needs mental alertness until you know how this drug affects you. Do not stand or sit up quickly, especially if you are an older  patient. This reduces the risk of dizzy or fainting spells. Alcohol can make you more drowsy and dizzy. Avoid alcoholic drinks. Avoid salt substitutes unless you are told otherwise by your doctor or health care professional. Do not treat yourself for coughs, colds, or pain while you are taking this medicine without asking your doctor or health care professional for advice. Some ingredients may increase your blood pressure. What side effects may I notice from receiving this medicine? Side effects that you should report to your doctor or health care professional as soon as possible: -abdominal pain with or without nausea or vomiting -allergic reactions like skin rash or hives, swelling of the hands, feet, face, lips, throat, or tongue -dark urine -difficulty breathing -dizzy, lightheaded or fainting spell -fever or sore throat -irregular heart beat, chest pain -pain or difficulty passing urine -redness, blistering, peeling or loosening of the skin, including inside the mouth -unusually weak -yellowing of the eyes or skin Side effects that usually do not require medical attention (report to your doctor or health care professional if they continue or are bothersome): -change in taste -cough -decreased sexual function or desire -headache -sun sensitivity -tiredness This list may not describe all possible side effects. Call your doctor for medical advice about side effects. You may report side effects to FDA at 1-800-FDA-1088. Where should I keep my medicine? Keep out of the reach of children. Store at room temperature between 15 and 30 degrees C (59 and 86 degrees F). Protect from moisture. Keep container tightly closed. Throw away any unused medicine after the expiration date. NOTE: This sheet is a summary. It may not cover all possible information. If you have questions about this medicine, talk to your doctor, pharmacist, or health care provider.  2015, [employer] Standard. (2008-01-23  17:36:32)   Amlodipine tablets What is this medicine? AMLODIPINE (am LOE di peen) is a calcium-channel blocker. It affects the amount of calcium found in your heart and muscle cells. This relaxes your blood vessels, which can reduce the amount of work the heart has to do. This medicine is used to lower high blood pressure. It is also used to prevent chest pain. This medicine may be used for other purposes; ask your health care provider or pharmacist if you have questions. COMMON BRAND NAME(S):  Norvasc What should I tell my health care provider before I take this medicine? They need to know if you have any of these conditions: -heart problems like heart failure or aortic stenosis -liver disease -an unusual or allergic reaction to amlodipine, other medicines, foods, dyes, or preservatives -pregnant or trying to get pregnant -breast-feeding How should I use this medicine? Take this medicine by mouth with a glass of water. Follow the directions on the prescription label. Take your medicine at regular intervals. Do not take more medicine than directed. Talk to your pediatrician regarding the use of this medicine in children. Special care may be needed. This medicine has been used in children as young as 6. Persons over 60 years old may have a stronger reaction to this medicine and need smaller doses. Overdosage: If you think you have taken too much of this medicine contact a poison control center or emergency room at once. NOTE: This medicine is only for you. Do not share this medicine with others. What if I miss a dose? If you miss a dose, take it as soon as you can. If it is almost time for your next dose, take only that dose. Do not take double or extra doses. What may interact with this medicine? -herbal or dietary supplements -local or general anesthetics -medicines for high blood pressure -medicines for prostate problems -rifampin This list may not describe all possible interactions.  Give your health care provider a list of all the medicines, herbs, non-prescription drugs, or dietary supplements you use. Also tell them if you smoke, drink alcohol, or use illegal drugs. Some items may interact with your medicine. What should I watch for while using this medicine? Visit your doctor or health care professional for regular check ups. Check your blood pressure and pulse rate regularly. Ask your health care professional what your blood pressure and pulse rate should be, and when you should contact him or her. This medicine may make you feel confused, dizzy or lightheaded. Do not drive, use machinery, or do anything that needs mental alertness until you know how this medicine affects you. To reduce the risk of dizzy or fainting spells, do not sit or stand up quickly, especially if you are an older patient. Avoid alcoholic drinks; they can make you more dizzy. Do not suddenly stop taking amlodipine. Ask your doctor or health care professional how you can gradually reduce the dose. What side effects may I notice from receiving this medicine? Side effects that you should report to your doctor or health care professional as soon as possible: -allergic reactions like skin rash, itching or hives, swelling of the face, lips, or tongue -breathing problems -changes in vision or hearing -chest pain -fast, irregular heartbeat -swelling of legs or ankles Side effects that usually do not require medical attention (report to your doctor or health care professional if they continue or are bothersome): -dry mouth -facial flushing -nausea, vomiting -stomach gas, pain -tired, weak -trouble sleeping This list may not describe all possible side effects. Call your doctor for medical advice about side effects. You may report side effects to FDA at 1-800-FDA-1088. Where should I keep my medicine? Keep out of the reach of children. Store at room temperature between 59 and 86 degrees F (15 and 30 degrees  C). Protect from light. Keep container tightly closed. Throw away any unused medicine after the expiration date. NOTE: This sheet is a summary. It may not cover all possible information. If you have questions about this medicine,  talk to your doctor, pharmacist, or health care provider.  2015, [employer] Standard. (2012-06-17 11:40:58)  pressure and pulse. The readings are shown on a monitor. Hold still and breathe normally while the cuff is inflated.  Getting a reading takes less than a minute.  Some models store readings in a memory. Some provide a printout of readings. If your machine does not store your readings, keep a written record.  Take readings with you to your next visit with your doctor.    [employer] Patient Information 2015 [place], [place]. This information is not intended to replace advice given to you by your health care provider. Make sure you discuss any questions you have with your health care provider.

## 2014-11-08 NOTE — Progress Notes (Signed)
*  PRELIMINARY RESULTS* Echocardiogram 2D Echocardiogram has been performed.  Brandon Perkins, Symantha Steeber 11/08/2014, 12:53 PM

## 2014-11-08 NOTE — Discharge Summary (Signed)
Physician Discharge Summary  Brandon Perkins Reicher YQI:[id number] DOB: 10/24/1950 DOA: 11/06/2014  PCP: No primary care provider on file.  Admit date: 11/06/2014 Discharge date: 11/08/2014  Time spent: 45 minutes  Recommendations for Outpatient Follow-up:  1. Outpatient Cardiology referral made 2. [facility] referral made by Case manager  Discharge Diagnoses:  Principal Problem:   Hypertensive urgency Active Problems:   Syncope, near   Acute blood loss anemia   Hand laceration   Hypokalemia   Trace heme positive  Discharge Condition: stable  Diet recommendation: heart healthy  Filed Weights   11/07/14 0126  Weight: 87.9 kg (193 lb 12.6 oz)    History of present illness:  HPI: Brandon Perkins Kantor is a 64 y.o. male without significant past medical history, who presented with left hand laceration, dizziness, elevated blood pressure. Patient initially presented to the emergency room because of laceration to the dorsal aspect of the hand and dorsal aspect of the index finger while working with a grinder. Orthopedic surgeon was consulted, debridement was performed. When patient was about to be discharged home from emergency room, he developed dizziness and lightheadedness, but did not pass out. No falling. The episode lasted for about 15-30 seconds.  Patient was also found to have significant elevated blood pressure at 254/99.  In ED, patient was found to have elevated blood pressure at 254/99, negative troponin, positive FOBT, hypokalemia. EKG showed bifascicular blockage.   Hospital Course:  1. Accelerated HTN -BP was 250/99 on admission, since improving -started on HCTZ, and norvasc, given prescriptions for this -2D ECHO with normal EF and wall motion  2. ACute blood loss anemia -due to large laceration of hand, pt reports profuse bleeding prior to arrival in ED -s/p repair, Hb stable since -hemoccult trace positive in the ER, no overt GI bleeding, normal colonsocopy within  5years per Dr.Hung, recommended outpatient FU  3. Dizziness/neear syncope -due to blood loss from laceration and Accelerated HTN -2D ECHO, with normal EF and wall motion, minimally elevated troponin, likely due to Accelerated HTN/demand, repeat normalized  -started on ASA 81mg  at discharge  4. Hypokalemia: -Repleted  5. Hand laceration: Post status of repair and debriment. - Keflex for 10 days per surgeon -Percocet for pain -Fu with Dr.Gramig in 10days   Procedures:  Irrigation/debridement and repair of LUE laceration  Consultations:  Ortho-hand surgery  Discharge Exam: Filed Vitals:   11/08/14 1327  BP: 179/70  Pulse: 51  Temp: 97.9 F (36.6 C)  Resp: 18    General: AAOx3 Cardiovascular: S1s2/RRR Respiratory: CTAB  Discharge Instructions   Discharge Instructions    Diet - low sodium heart healthy    Complete by:  As directed      Increase activity slowly    Complete by:  As directed           Current Discharge Medication List    START taking these medications   Details  amLODipine (NORVASC) 10 MG tablet Take 1 tablet (10 mg total) by mouth daily. Qty: 30 tablet, Refills: 0    cephALEXin (KEFLEX) 500 MG capsule Take 1 capsule (500 mg total) by mouth every 6 (six) hours. For 8days Qty: 24 capsule, Refills: 0    hydrochlorothiazide (HYDRODIURIL) 25 MG tablet Take 1 tablet (25 mg total) by mouth daily. Qty: 30 tablet, Refills: 0    oxyCODONE-acetaminophen (PERCOCET/ROXICET) 5-325 MG per tablet Take 1 tablet by mouth every 4 (four) hours as needed for moderate pain. Qty: 15 tablet, Refills: 0  Allergies  Allergen Reactions  . Aspirin     When takes a lot of aspirin-- reaction unknown   Follow-up Information    Follow up with [facility]    .   Why:  You need to get a primary doctor to follow your blood pressure, The clinic above has walk in hours M-F from 9-5   Contact information:   [address]  [place] [address] [phone]      Follow up with Karen Chafe, MD. Schedule an appointment as soon as possible for a visit in 10 days.   Specialty:  Orthopedic Surgery   Why:  for left arm wound check   Contact information:   [address] [place] [address] [phone]        The results of significant diagnostics from this hospitalization (including imaging, microbiology, ancillary and laboratory) are listed below for reference.    Significant Diagnostic Studies: Dg Hand Complete Left  11/06/2014   CLINICAL DATA:  Left hand laceration after saw accident. Initial encounter.  EXAM: LEFT HAND - COMPLETE 3+ VIEW  COMPARISON:  None.  FINDINGS: There is no evidence of fracture or dislocation. There is no evidence of arthropathy or other focal bone abnormality. No radiopaque foreign body is noted.  IMPRESSION: Normal left hand.   Electronically Signed   By: Lupita Raider, M.D.   On: 11/06/2014 10:48    Microbiology: No results found for this or any previous visit (from the past 240 hour(s)).   Labs: Basic Metabolic Panel:  Recent Labs Lab 11/06/14 1457 11/06/14 2127 11/07/14 0800  NA 141 138 135  K 3.7 3.2* 3.8  CL 101 103 99  CO2  --  26 27  GLUCOSE 107* 111* 113*  BUN CREATININE 0.90 1.02 1.01  CALCIUM  --  8.5 8.8   Liver Function Tests:  Recent Labs Lab 11/07/14 0800  AST 23  ALT 15  ALKPHOS 53  BILITOT 1.7*  PROT 6.9  ALBUMIN 3.2*   No results for input(s): LIPASE, AMYLASE in the last 168 hours. No results for input(s): AMMONIA in the last 168 hours. CBC:  Recent Labs Lab 11/06/14 1457 11/06/14 2127 11/07/14 0223 11/07/14 0800 11/08/14 0523  WBC  --  7.0 7.1 6.7 6.1  NEUTROABS  --  4.0  --   --   --   HGB 14.6 11.8* 12.2* 12.4* 11.6*  HCT 43.0 37.6* 38.7* 39.1 36.5*  MCV  --  83.2 82.3 81.6 81.7  PLT  --  189 197 206 210   Cardiac Enzymes:  Recent Labs Lab 11/06/14 2127  11/07/14 0223 11/07/14 0800 11/07/14 1215  TROPONINI 0.03 0.04* 0.04* 0.03   BNP: BNP (last 3 results)  Recent Labs  11/07/14 0223  BNP 83.5    ProBNP (last 3 results) No results for input(s): PROBNP in the last 8760 hours.  CBG:  Recent Labs Lab 11/06/14 2030 11/07/14 0625 11/08/14 0640 11/08/14 0817  GLUCAP 102* 111* 106* 120*       Signed:  Oda Placke  [facility] 11/08/2014, 2:39 PM

## 2014-11-08 NOTE — Care Management Note (Signed)
    Page 1 of 1   11/08/2014     4:45:58 PM CARE MANAGEMENT NOTE 11/08/2014  Patient:  Tawanna SoloSMITH,Rapheal G   Account Number:  [id number]  Date Initiated:  11/08/2014  Documentation initiated by:  Donn PieriniWEBSTER,Latha Staunton  Subjective/Objective Assessment:   Pt admitted with hand laceration     Action/Plan:   PTA pt lived at home   Anticipated DC Date:  11/08/2014   Anticipated DC Plan:  HOME/SELF CARE      DC Planning Services  CM consult  PCP issues      Choice offered to / List presented to:             Status of service:  Completed, signed off Medicare Important Message given?   (If response is "NO", the following Medicare IM given date fields will be blank) Date Medicare IM given:   Medicare IM given by:   Date Additional Medicare IM given:   Additional Medicare IM given by:    Discharge Disposition:  HOME/SELF CARE  Per UR Regulation:  Reviewed for med. necessity/level of care/duration of stay  If discussed at Long Length of Stay Meetings, dates discussed:    Comments:  11/08/14- 1230- Donn PieriniKristi Julicia Krieger RN, BSN [phone] Spoke with pt regarding PCP needs- per conversation pt states that he use to go to the Urgent Care on [address] prior to it closing- pt plans on having insurance come August- in the meantime- discussed pt options to include [facility] or some of the other clinics- pt states that he would prefer to have the info on the clinic options and he will f/u on his own- would like to find something close to where he lives- wants to look into an Urgent Care off [place] [address]- list of PCP options for self pay given to pt including [facility] pt to f/u on his own- also have pt [facility] # for when he gets insurance he could use to find a PCP-

## 2014-11-09 NOTE — Progress Notes (Signed)
Utilization review completed.  

## 2017-11-29 ENCOUNTER — Encounter (INDEPENDENT_AMBULATORY_CARE_PROVIDER_SITE_OTHER): Payer: Self-pay | Admitting: Ophthalmology

## 2017-12-10 ENCOUNTER — Encounter (INDEPENDENT_AMBULATORY_CARE_PROVIDER_SITE_OTHER): Payer: Self-pay | Admitting: Ophthalmology

## 2018-09-26 NOTE — Progress Notes (Signed)
[facility] - Clinic Note  09/27/2018     CHIEF COMPLAINT Patient presents for Retina Evaluation   HISTORY OF PRESENT ILLNESS: Brandon Perkins is a 68 y.o. male who presents to the clinic today for:   HPI    Retina Evaluation    In right eye.  This started 10 months ago.  Duration of 10 months.  Associated Symptoms Negative for Flashes, Blind Spot, Photophobia, Scalp Tenderness, Fever, Floaters, Pain, Glare, Jaw Claudication, Weight Loss, Distortion, Redness, Trauma, Shoulder/Hip pain and Fatigue.  Context:  distance vision, mid-range vision and near vision.  Treatments tried include no treatments.  I, the attending physician,  performed the HPI with the patient and updated documentation appropriately.          Comments    Patient states referred by Dr. Conley Rolls in 2019. Patient did not have insurance. Saw Dr. Conley Rolls because vision in OD "looked like water"- sudden onset 10 months ago. Vision improved a little since onset of symptoms, but vision in OD not as good as in OS. Last week, patient reports had headache with nose bleed. Patient felt lightheaded that evening and thinks that he may have blacked out in the back of car when he went to retrieve keys from car. No history of stroke or heart attack. No history of diabetes. Patient states he currently takes no medications.        Last edited by Rennis Chris, MD on 09/27/2018 10:11 AM. (History)    pt states he saw Dr. Conley Rolls back in April 2019 bc he was having blurry vision, he states he wasn't having any headaches at that time, he states she referred him here at the time, but he did not have insurance, he states it feels like he's looking through fog or water, he states his vision seems to have stayed the same since then, pt denies being diabetic and states he does not take any medications, pt states he does not see a dr on a regular basis  Referring physician: Conley Rolls, My [place], OD [address] [place], [place]  [address]  HISTORICAL INFORMATION:   Selected notes from the MEDICAL RECORD NUMBER Referred by Dr. Aletta Edouard for concern of BRVO OD LEE: 04.29.19 (M.Le) [BCVA: OD: 20/400 OS: 20/20] Ocular Hx- PMH-hx of blood clots    CURRENT MEDICATIONS: Current Outpatient Medications (Ophthalmic Drugs)  Medication Sig  . prednisoLONE acetate (PRED FORTE) 1 % ophthalmic suspension Place 1 drop into the right eye 4 (four) times daily for 7 days.   No current facility-administered medications for this visit.  (Ophthalmic Drugs)   Current Outpatient Medications (Other)  Medication Sig  . amLODipine (NORVASC) 10 MG tablet Take 1 tablet (10 mg total) by mouth daily. (Patient not taking: Reported on 09/27/2018)  . aspirin EC 81 MG tablet Take 1 tablet (81 mg total) by mouth daily. (Patient not taking: Reported on 09/27/2018)  . cephALEXin (KEFLEX) 500 MG capsule Take 1 capsule (500 mg total) by mouth every 6 (six) hours. For 8days (Patient not taking: Reported on 09/27/2018)  . hydrochlorothiazide (HYDRODIURIL) 25 MG tablet Take 1 tablet (25 mg total) by mouth daily. (Patient not taking: Reported on 09/27/2018)  . oxyCODONE-acetaminophen (PERCOCET/ROXICET) 5-325 MG per tablet Take 1 tablet by mouth every 4 (four) hours as needed for moderate pain. (Patient not taking: Reported on 09/27/2018)   No current facility-administered medications for this visit.  (Other)      REVIEW OF SYSTEMS: ROS  Positive for: Eyes   Negative for: Constitutional, Gastrointestinal, Neurological, Skin, Genitourinary, Musculoskeletal, HENT, Endocrine, Cardiovascular, Respiratory, Psychiatric, Allergic/Imm, Heme/Lymph   Last edited by Annalee Genta D on 09/27/2018  9:17 AM. (History)       ALLERGIES Allergies  Allergen Reactions  . Aspirin     When takes a lot of aspirin-- reaction unknown    PAST MEDICAL HISTORY Past Medical History:  Diagnosis Date  . Blood clot in vein    left arm vein blood clot, not know the detail,  treated with ASA per pt  . Hypertension    History reviewed. No pertinent surgical history.  FAMILY HISTORY Family History  Problem Relation Age of Onset  . Dementia Mother   . Colon cancer Father   . Stroke Sister        Sister had brain aneurysm  . Diabetes Brother     SOCIAL HISTORY Social History   Tobacco Use  . Smoking status: Never Smoker  . Smokeless tobacco: Never Used  Substance Use Topics  . Alcohol use: No  . Drug use: No         OPHTHALMIC EXAM:  Base Eye Exam    Visual Acuity (Snellen - Linear)      Right Left   Dist [place] 20/50 -2 20/25 -1   Dist ph [place] NI 20/20       Tonometry (Tonopen, 9:34 AM)      Right Left   Pressure 12 14       Pupils      Dark Light Shape React APD   Right 4 3 Round Brisk None   Left 4 3 Round Brisk None       Visual Fields (Counting fingers)      Left Right    Full Full       Extraocular Movement      Right Left    Full, Ortho Full, Ortho       Neuro/Psych    Oriented x3:  Yes   Mood/Affect:  Normal       Dilation    Both eyes:  1.0% Mydriacyl, 2.5% Phenylephrine @ 9:34 AM        Slit Lamp and Fundus Exam    Slit Lamp Exam      Right Left   Lids/Lashes Dermatochalasis - upper lid, mild Meibomian gland dysfunction Dermatochalasis - upper lid, mild Meibomian gland dysfunction   Conjunctiva/Sclera mild nasal Pinguecula, mild Melanosis mild nasal Pinguecula, mild Melanosis   Cornea mild Arcus, 1+ Punctate epithelial erosions mild Arcus, 2-3+ inferior Punctate epithelial erosions   Anterior Chamber Deep and quiet, narrow angles Deep and quiet, narrow angles   Iris Round and dilated, No NVI Round and dilated, No NVI   Lens 2+ Nuclear sclerosis, 2-3+ Cortical cataract 2+ Nuclear sclerosis, 2-3+ Cortical cataract   Vitreous Vitreous syneresis, white vitreous condensations inferior, ?old heme Vitreous syneresis       Fundus Exam      Right Left   Disc Sharp rim, +collateral vessels - nasal/temporal Pink and  Sharp   C/D Ratio 0.55 0.55   Macula Blunted foveal reflex, scattered Atrophy, Exudates, +IRH, telangiectatic vessels, subretinal blot heme superior macula Flat, Good foveal reflex, mild Retinal pigment epithelial mottling, No heme or edema   Vessels Vascular attenuation, Copper wiring, early sclerosis of inferior arterioles, mild fibrosis along inferior arcades, inferior HRVO Vascular attenuation, Copper wiring, AV crossing changes   Periphery Attached, scattered IRF inferior hemisphere, mild White without pressure superiorly  Attached           Refraction    Manifest Refraction      Dist VA   Right 20/50-2   Left 20/20          IMAGING AND PROCEDURES  Imaging and Procedures for @  OCT, Retina - OU - Both Eyes       Right Eye Quality was good. Central Foveal Thickness: 292. Progression has no prior data. Findings include abnormal foveal contour, intraretinal fluid, no SRF, outer retinal atrophy, pigment epithelial detachment, vitreous traction, epiretinal membrane.   Left Eye Quality was good. Central Foveal Thickness: 283. Progression has no prior data. Findings include normal foveal contour, no SRF, no IRF.   Notes *Images captured and stored on drive  Diagnosis / Impression:  OD: diffuse macular atrophy; mild IRF/cystic changes inferior macula OS: NFP, no IRF/SRF   Clinical management:  See below  Abbreviations: NFP - Normal foveal profile. CME - cystoid macular edema. PED - pigment epithelial detachment. IRF - intraretinal fluid. SRF - subretinal fluid. EZ - ellipsoid zone. ERM - epiretinal membrane. ORA - outer retinal atrophy. ORT - outer retinal tubulation. SRHM - subretinal hyper-reflective material        Fluorescein Angiography Optos (Transit OD)       Right Eye   Progression has no prior data. Early phase findings include delayed filling, vascular perfusion defect, microaneurysm. Mid/Late phase findings include leakage, microaneurysm, vascular  perfusion defect.   Left Eye   Progression has no prior data. Early phase findings include normal observations. Mid/Late phase findings include leakage (focal areas of peripheral leakage temporal).   Notes Images stored on drive;   Impression: OD: remote inferior HRVO - re-perfused; + vascular perfusion defects and diffuse leakage inferior hemisphere OS: noraml study early; focal areas of peripheral leakage temporal          Panretinal Photocoagulation - OD - Right Eye       LASER PROCEDURE NOTE  Diagnosis:   Hemiretinal vein occlusion, RIGHT EYE  Procedure:  Sectoral pan-retinal photocoagulation using slit lamp laser, RIGHT EYE  Anesthesia:  Topical  Surgeon: Rennis Chris, MD, PhD   Informed consent obtained, operative eye marked, and time out performed prior to initiation of laser.   Lumenis [id number] slit lamp laser Pattern: 3x3 square Power: 280 mW Duration: 30 msec  Spot size: 200 microns  # spots: 1356 spots inferior hemisphere  Complications: None.  Notes: significant vitreous heme obscuring view and preventing laser up take inferiorly and scattered focal areas  RTC: 2 wks for PRP, contralateral eye  Patient tolerated the procedure well and received written and verbal post-procedure care information/education.                  ASSESSMENT/PLAN:    ICD-10-CM   1. Branch retinal vein occlusion of right eye with macular edema H34.8310 Panretinal Photocoagulation - OD - Right Eye  2. Retinal edema H35.81 OCT, Retina - OU - Both Eyes  3. Essential hypertension I10   4. Hypertensive retinopathy of both eyes H35.033 Fluorescein Angiography Optos (Transit OD)  5. Combined forms of age-related cataract of both eyes H25.813     1,2. Remote, inferior HRVO OD  - originally referred by Dr. Conley Rolls in April 2019, but pt never presented for consult  - The natural history of retinal vein occlusion and macular edema and treatment options including observation,  laser photocoagulation, and intravitreal antiVEGF injection with Avastin and Lucentis and Eylea and intravitreal injection  of steroids with triamcinolone and Ozurdex and the complications of these procedures including loss of vision, infection, cataract, glaucoma, and retinal detachment were discussed with patient.  - Specifically discussed findings from CRUISE / BRAVO study regarding patient stabilization with anti-VEGF agents and increased potential for visual improvements.  Also discussed need for frequent follow up and potentially multiple injections given the chronic nature of the disease process  - exam shows diffuse macular atrophy, +collateral vessels on the disc, scattered telangectatic vessels inferiorly  - FA shows delayed filling, vascular perfusion defects, late leakage  - OCT with mild IRF/cystic changes  - BCVA 20/50 OD was 20/200 for Dr. Conley Rolls in April 2019  - recommend sectoral PRP OD to inferior hemisphere today, 02.25.20  - RBA of procedure discussed, questions answered  - informed consent obtained and signed  - see procedure note  - start PF QID OD x7 days  - f/u 4-6 wks -- may need IVA  3,4. Hypertensive retinopathy OU - discussed importance of tight BP control - monitor  5. 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  - discussed diagnosis and progression  - not yet visually significant  - monitor for now   Ophthalmic Meds Ordered this visit:  Meds ordered this encounter  Medications  . prednisoLONE acetate (PRED FORTE) 1 % ophthalmic suspension    Sig: Place 1 drop into the right eye 4 (four) times daily for 7 days.    Dispense:  10 mL    Refill:  0       Return for 4-6 wks, Dilated Exam, OCT.  There are no Patient Instructions on file for this visit.   Explained the diagnoses, plan, and follow up with the patient and they expressed understanding.  Patient expressed  understanding of the importance of proper follow up care.   This document serves as a record of services personally performed by Karie Chimera, MD, PhD. It was created on their behalf by Laurian Brim, OA, an ophthalmic assistant. The creation of this record is the provider's dictation and/or activities during the visit.    Electronically signed by: Laurian Brim, OA  02.24.2020 1:12 PM    Karie Chimera, M.D., Ph.D. Diseases & Surgery of the Retina and Vitreous [facility]   I have reviewed the above documentation for accuracy and completeness, and I agree with the above. Karie Chimera, M.D., Ph.D. 09/27/18 1:12 PM     Abbreviations: M myopia (nearsighted); A astigmatism; H hyperopia (farsighted); P presbyopia; Mrx spectacle prescription;  CTL contact lenses; OD right eye; OS left eye; OU both eyes  XT exotropia; ET esotropia; PEK punctate epithelial keratitis; PEE punctate epithelial erosions; DES dry eye syndrome; MGD meibomian gland dysfunction; ATs artificial tears; PFAT's preservative free artificial tears; NSC nuclear sclerotic cataract; PSC posterior subcapsular cataract; ERM epi-retinal membrane; PVD posterior vitreous detachment; RD retinal detachment; DM diabetes mellitus; DR diabetic retinopathy; NPDR non-proliferative diabetic retinopathy; PDR proliferative diabetic retinopathy; CSME clinically significant macular edema; DME diabetic macular edema; dbh dot blot hemorrhages; CWS cotton wool spot; POAG primary open angle glaucoma; C/D cup-to-disc ratio; HVF humphrey visual field; GVF goldmann visual field; OCT optical coherence tomography; IOP intraocular pressure; BRVO Branch retinal vein occlusion; CRVO central retinal vein occlusion; CRAO central retinal artery occlusion; BRAO branch retinal artery occlusion; RT retinal tear; SB scleral buckle; PPV pars plana vitrectomy; VH Vitreous hemorrhage; PRP panretinal laser  photocoagulation; IVK intravitreal kenalog;  VMT vitreomacular traction; MH Macular hole;  NVD neovascularization of the disc; NVE neovascularization elsewhere; AREDS age related eye disease study; ARMD age related macular degeneration; POAG primary open angle glaucoma; EBMD epithelial/anterior basement membrane dystrophy; ACIOL anterior chamber intraocular lens; IOL intraocular lens; PCIOL posterior chamber intraocular lens; Phaco/IOL phacoemulsification with intraocular lens placement; PRK photorefractive keratectomy; LASIK laser assisted in situ keratomileusis; HTN hypertension; DM diabetes mellitus; COPD chronic obstructive pulmonary disease

## 2018-09-27 ENCOUNTER — Encounter (INDEPENDENT_AMBULATORY_CARE_PROVIDER_SITE_OTHER): Payer: Self-pay | Admitting: Ophthalmology

## 2018-09-27 ENCOUNTER — Ambulatory Visit (INDEPENDENT_AMBULATORY_CARE_PROVIDER_SITE_OTHER): Payer: Medicare Other | Admitting: Ophthalmology

## 2018-09-27 DIAGNOSIS — H35033 Hypertensive retinopathy, bilateral: Secondary | ICD-10-CM

## 2018-09-27 DIAGNOSIS — H25813 Combined forms of age-related cataract, bilateral: Secondary | ICD-10-CM

## 2018-09-27 DIAGNOSIS — I10 Essential (primary) hypertension: Secondary | ICD-10-CM | POA: Diagnosis not present

## 2018-09-27 DIAGNOSIS — H34831 Tributary (branch) retinal vein occlusion, right eye, with macular edema: Secondary | ICD-10-CM

## 2018-09-27 DIAGNOSIS — H3581 Retinal edema: Secondary | ICD-10-CM

## 2018-09-27 MED ORDER — PREDNISOLONE ACETATE 1 % OP SUSP: 1.0000 [drp] | Freq: Four times a day (QID) | OPHTHALMIC | 0 refills | Status: AC

## 2018-10-25 ENCOUNTER — Encounter (INDEPENDENT_AMBULATORY_CARE_PROVIDER_SITE_OTHER): Payer: Medicare Other | Admitting: Ophthalmology

## 2018-11-06 ENCOUNTER — Other Ambulatory Visit: Payer: Self-pay

## 2018-11-06 ENCOUNTER — Encounter (HOSPITAL_COMMUNITY): Payer: Self-pay | Admitting: Emergency Medicine

## 2018-11-06 ENCOUNTER — Emergency Department (HOSPITAL_COMMUNITY)
Admission: EM | Admit: 2018-11-06 | Discharge: 2018-11-06 | Disposition: A | Discharge status: Home / Self Care | Payer: Medicare Other | Attending: Emergency Medicine | Admitting: Emergency Medicine

## 2018-11-06 DIAGNOSIS — I10 Essential (primary) hypertension: Secondary | ICD-10-CM

## 2018-11-06 DIAGNOSIS — R040 Epistaxis: Secondary | ICD-10-CM | POA: Diagnosis present

## 2018-11-06 MED ORDER — AMLODIPINE BESYLATE 10 MG PO TABS: 10.0000 mg | ORAL_TABLET | Freq: Every day | ORAL | 0 refills | Status: DC

## 2018-11-06 NOTE — Discharge Instructions (Addendum)
Call ENT for removal of packing on Tuesday or Wednesday or primary care doctor.

## 2018-11-06 NOTE — ED Triage Notes (Signed)
Pt here for evaluation of epistaxis that began around 1400 yesterday. Pt states he went to [facility] yesterday and they were unable to stop the bleed and recommended he come here.

## 2018-11-06 NOTE — ED Notes (Signed)
ED Provider at bedside. 

## 2018-11-06 NOTE — ED Provider Notes (Addendum)
[facility] EMERGENCY DEPARTMENT Provider Note   CSN: [id number] Arrival date & time: 11/06/18  0820    History   Chief Complaint No chief complaint on file.   HPI Brandon Perkins is a 68 y.o. male.     HPI  68 year old man history of hypertension presents today complaining of nosebleed.  Patient states began bleeding yesterday and has been intermittently bleeding.  He has applied pressure intermittently and used afrin.  States  Past Medical History:  Diagnosis Date  . Blood clot in vein    left arm vein blood clot, not know the detail, treated with ASA per pt  . Hypertension     Patient Active Problem List   Diagnosis Date Noted  . Hypokalemia 11/07/2014  . Hypertensive urgency 11/07/2014  . Elevated blood pressure 11/06/2014  . Syncope, near 11/06/2014  . GI bleeding 11/06/2014  . Hand laceration 11/06/2014    History reviewed. No pertinent surgical history.      Home Medications    Prior to Admission medications   Medication Sig Start Date End Date Taking? Authorizing Provider  amLODipine (NORVASC) 10 MG tablet Take 1 tablet (10 mg total) by mouth daily. Patient not taking: Reported on 09/27/2018 11/08/14   Zannie Cove, MD  aspirin EC 81 MG tablet Take 1 tablet (81 mg total) by mouth daily. Patient not taking: Reported on 09/27/2018 11/08/14   Zannie Cove, MD  cephALEXin (KEFLEX) 500 MG capsule Take 1 capsule (500 mg total) by mouth every 6 (six) hours. For 8days Patient not taking: Reported on 09/27/2018 11/08/14   Zannie Cove, MD  hydrochlorothiazide (HYDRODIURIL) 25 MG tablet Take 1 tablet (25 mg total) by mouth daily. Patient not taking: Reported on 09/27/2018 11/08/14   Zannie Cove, MD  oxyCODONE-acetaminophen (PERCOCET/ROXICET) 5-325 MG per tablet Take 1 tablet by mouth every 4 (four) hours as needed for moderate pain. Patient not taking: Reported on 09/27/2018 11/08/14   Zannie Cove, MD    Family History Family History   Problem Relation Age of Onset  . Dementia Mother   . Colon cancer Father   . Stroke Sister        Sister had brain aneurysm  . Diabetes Brother     Social History Social History   Tobacco Use  . Smoking status: Never Smoker  . Smokeless tobacco: Never Used  Substance Use Topics  . Alcohol use: No  . Drug use: No     Allergies   Aspirin   Review of Systems Review of Systems  All other systems reviewed and are negative.    Physical Exam Updated Vital Signs BP (!) 190/125 (BP Location: Right Arm)   Pulse 91   Temp 98.1 F (36.7 C) (Oral)   Resp 18   SpO2 99%   Physical Exam Vitals signs and nursing note reviewed.  Constitutional:      Appearance: Normal appearance.  HENT:     Head: Normocephalic.     Right Ear: External ear normal.     Left Ear: External ear normal.     Nose:     Comments: Left nares with bleeding.    Mouth/Throat:     Mouth: Mucous membranes are moist.  Eyes:     Pupils: Pupils are equal, round, and reactive to light.  Neck:     Musculoskeletal: Normal range of motion.  Cardiovascular:     Rate and Rhythm: Regular rhythm.  Pulmonary:     Effort: Pulmonary effort is normal.  Abdominal:     General: Abdomen is flat.  Musculoskeletal: Normal range of motion.  Skin:    General: Skin is warm.     Capillary Refill: Capillary refill takes less than 2 seconds.  Neurological:     General: No focal deficit present.     Mental Status: He is alert.  Psychiatric:        Mood and Affect: Mood normal.      ED Treatments / Results  Labs (all labs ordered are listed, but only abnormal results are displayed) Labs Reviewed - No data to display  EKG None  Radiology No results found.  Procedures .Epistaxis Management Date/Time: 11/06/2018 9:30 AM Performed by: Margarita Grizzle, MD Authorized by: Margarita Grizzle, MD   Consent:    Consent obtained:  Verbal   Consent given by:  Patient   Risks discussed:  Bleeding   Alternatives  discussed:  No treatment Procedure details:    Treatment site:  L anterior   Treatment method:  Anterior pack   Treatment complexity:  Limited   Treatment episode: initial   Post-procedure details:    Assessment:  Bleeding stopped   Patient tolerance of procedure:  Tolerated well, no immediate complications   (including critical care time)  Medications Ordered in ED Medications - No data to display   Initial Impression / Assessment and Plan / ED Course  I have reviewed the triage vital signs and the nursing notes.  Pertinent labs & imaging results that were available during my care of the patient were reviewed by me and considered in my medical decision making (see chart for details).    Attempted pressure but continued with clot Rhinorocket 5.5 cm placed   Final Clinical Impressions(s) / ED Diagnoses   Final diagnoses:  Epistaxis  Hypertension, unspecified type    ED Discharge Orders    None       Margarita Grizzle, MD 11/06/18 2725    Margarita Grizzle, MD 11/06/18 [phone]

## 2018-11-06 NOTE — ED Notes (Signed)
Patient verbalizes understanding of discharge instructions. Opportunity for questioning and answers were provided. Armband removed by staff, pt discharged from ED.  

## 2018-11-13 NOTE — Progress Notes (Addendum)
[facility] - Clinic Note  11/14/2018     CHIEF COMPLAINT Patient presents for Retina Follow Up   HISTORY OF PRESENT ILLNESS: Brandon Perkins is a 68 y.o. male who presents to the clinic today for:   HPI    Retina Follow Up    Patient presents with  CRVO/BRVO.  In right eye.  This started 3 months ago.  Severity is mild.  Since onset it is gradually improving.  I, the attending physician,  performed the HPI with the patient and updated documentation appropriately.          Comments    F/U BRVO OD. Patient states his vision has "gotten better", denies flashes floaters and ocular pain. Pt reports [facility] and [facility], "plug inserted" , Bp slight high per patient, he is taking HTCZ , denies eye gtt's       Last edited by Rennis ChrisZamora, Nichlos Kunzler, MD on 11/14/2018  9:36 AM. (History)    pt states he has noticed a slight difference in his vision since last visit   Referring physician: No referring provider defined for this encounter.  HISTORICAL INFORMATION:   Selected notes from the MEDICAL RECORD NUMBER Referred by Dr. Aletta EdouardMy Le for concern of BRVO OD LEE: 04.29.19 (M.Le) [BCVA: OD: 20/400 OS: 20/20] Ocular Hx- PMH-hx of blood clots    CURRENT MEDICATIONS: No current outpatient medications on file. (Ophthalmic Drugs)   No current facility-administered medications for this visit.  (Ophthalmic Drugs)   Current Outpatient Medications (Other)  Medication Sig  . amLODipine (NORVASC) 10 MG tablet Take 1 tablet (10 mg total) by mouth daily.  Marland Kitchen. aspirin EC 81 MG tablet Take 1 tablet (81 mg total) by mouth daily.  . cephALEXin (KEFLEX) 500 MG capsule Take 1 capsule (500 mg total) by mouth every 6 (six) hours. For 8days  . hydrochlorothiazide (HYDRODIURIL) 25 MG tablet Take 1 tablet (25 mg total) by mouth daily.  Marland Kitchen. oxyCODONE-acetaminophen (PERCOCET/ROXICET) 5-325 MG per tablet Take 1 tablet by mouth every 4 (four) hours as needed  for moderate pain.   No current facility-administered medications for this visit.  (Other)      REVIEW OF SYSTEMS: ROS    Positive for: Eyes   Negative for: Constitutional, Gastrointestinal, Neurological, Skin, Genitourinary, Musculoskeletal, HENT, Endocrine, Cardiovascular, Respiratory, Psychiatric, Allergic/Imm, Heme/Lymph   Last edited by Eldridge ScotKendrick, Glenda, LPN on 1/61/09604/13/2020  9:03 AM. (History)       ALLERGIES Allergies  Allergen Reactions  . Aspirin     When takes a lot of aspirin-- reaction unknown    PAST MEDICAL HISTORY Past Medical History:  Diagnosis Date  . Blood clot in vein    left arm vein blood clot, not know the detail, treated with ASA per pt  . Hypertension    History reviewed. No pertinent surgical history.  FAMILY HISTORY Family History  Problem Relation Age of Onset  . Dementia Mother   . Colon cancer Father   . Stroke Sister        Sister had brain aneurysm  . Diabetes Brother     SOCIAL HISTORY Social History   Tobacco Use  . Smoking status: Never Smoker  . Smokeless tobacco: Never Used  Substance Use Topics  . Alcohol use: No  . Drug use: No         OPHTHALMIC EXAM:  Base Eye Exam    Visual Acuity (Snellen - Linear)  Right Left   Dist [place] 20/70 20/25   Dist ph [place] NI 20/25 +2       Tonometry (Tonopen, 9:13 AM)      Right Left   Pressure 16 12       Pupils      Dark Light Shape React APD   Right 4 3 Round Minimal None   Left 4 3 Round Brisk None       Visual Fields (Counting fingers)      Left Right    Full Full       Extraocular Movement      Right Left    Full, Ortho Full, Ortho       Neuro/Psych    Oriented x3:  Yes   Mood/Affect:  Normal       Dilation    Both eyes:  1.0% Mydriacyl, 2.5% Phenylephrine @ 9:13 AM        Slit Lamp and Fundus Exam    Slit Lamp Exam      Right Left   Lids/Lashes Dermatochalasis - upper lid, mild Meibomian gland dysfunction Dermatochalasis - upper lid, mild  Meibomian gland dysfunction   Conjunctiva/Sclera mild nasal Pinguecula, mild Melanosis mild nasal Pinguecula, mild Melanosis   Cornea mild Arcus, 1+ Punctate epithelial erosions mild Arcus, 1+ inferior Punctate epithelial erosions   Anterior Chamber Deep and quiet, narrow angles Deep and quiet, narrow angles   Iris Round and dilated, No NVI Round and dilated, No NVI   Lens 2+ Nuclear sclerosis, 2-3+ Cortical cataract, +vacules 2+ Nuclear sclerosis, 2-3+ Cortical cataract   Vitreous Vitreous syneresis, white vitreous condensations inferior, ?old heme Vitreous syneresis       Fundus Exam      Right Left   Disc Sharp rim, +collateral vessels - nasal/temporal Pink and Sharp   C/D Ratio 0.6 0.55   Macula Blunted foveal reflex, scattered Atrophy, Exudates, +IRH, telangiectatic vessels, subretinal blot heme superior macula - improved Flat, Good foveal reflex, mild Retinal pigment epithelial mottling, scattered CWS, No heme or edema   Vessels Vascular attenuation, Copper wiring, dilated and tortuous venules greatest inferiorly, early sclerosis of inferior arterioles, mild fibrosis along inferior arcades, inferior HRVO Vascular attenuation, tortuous, AV crossing changes   Periphery Attached, scattered IRF inferior hemisphere, mild White without pressure superiorly, good PRP inferior hemisphere Attached             IMAGING AND PROCEDURES  Imaging and Procedures for @  OCT, Retina - OU - Both Eyes       Right Eye Quality was good. Central Foveal Thickness: 355. Progression has worsened. Findings include abnormal foveal contour, intraretinal fluid, no SRF, outer retinal atrophy, pigment epithelial detachment, vitreous traction, epiretinal membrane (Interval increase in IRF and interval progression of VMT).   Left Eye Quality was good. Central Foveal Thickness: 286. Progression has been stable. Findings include normal foveal contour, no SRF, no IRF (Partial PVD).   Notes *Images captured  and stored on drive  Diagnosis / Impression:  OD: diffuse macular atrophy; Interval increase in IRF and interval progression of VMT OS: NFP, no IRF/SRF   Clinical management:  See below  Abbreviations: NFP - Normal foveal profile. CME - cystoid macular edema. PED - pigment epithelial detachment. IRF - intraretinal fluid. SRF - subretinal fluid. EZ - ellipsoid zone. ERM - epiretinal membrane. ORA - outer retinal atrophy. ORT - outer retinal tubulation. SRHM - subretinal hyper-reflective material        Intravitreal Injection, Pharmacologic Agent -  OD - Right Eye       Time Out 11/14/2018. 10:03 AM. Confirmed correct patient, procedure, site, and patient consented.   Anesthesia Topical anesthesia was used. Anesthetic medications included Lidocaine 2%, Proparacaine 0.5%.   Procedure Preparation included 5% betadine to ocular surface, eyelid speculum. A supplied needle was used.   Injection:  1.25 mg Bevacizumab (AVASTIN) SOLN   NDC: 16109-604-54, Lot: [id number] , Expiration date: 12/21/2018   Route: Intravitreal, Site: Right Eye, Waste: 0 mg  Post-op Post injection exam found visual acuity of at least counting fingers. The patient tolerated the procedure well. There were no complications. The patient received written and verbal post procedure care education.                 ASSESSMENT/PLAN:    ICD-10-CM   1. Branch retinal vein occlusion of right eye with macular edema H34.8310 Intravitreal Injection, Pharmacologic Agent - OD - Right Eye    Bevacizumab (AVASTIN) SOLN 1.25 mg  2. Retinal edema H35.81 OCT, Retina - OU - Both Eyes  3. Vitreomacular adhesion of right eye H43.821   4. Essential hypertension I10   5. Hypertensive retinopathy of both eyes H35.033   6. Combined forms of age-related cataract of both eyes H25.813     1,2. Remote, inferior HRVO OD  - originally referred by Dr. Conley Rolls in April 2019, but pt never presented for consult  - exam shows diffuse  macular atrophy, +collateral vessels on the disc, scattered telangectatic vessels inferiorly  - FA (02.25.20)  shows delayed filling, vascular perfusion defects, late leakage  - OCT with interval increase in IRF and interval progression of VMT  - BCVA decreased to 20/70 from 20/50 OD; was 20/200 for Dr. Conley Rolls in April 2019  - s/p sectoral PRP OD to inferior hemisphere (02.25.20)  - recommend IVA OD #1 today, 04.13.20 for inc CME  - pt wishes to proceed  - RBA of procedure discussed, questions answered  - informed consent obtained and signed  - see procedure note  - f/u 4 wks -- DFE/OCT/ possible injection  3. Vitreomacular traction OD - mild VMT w/ interval progression from prior - may be contributing to increased IRF - IVA as above may induce release of VMT - monitor  4,5. Hypertensive retinopathy OU - discussed importance of tight BP control - monitor  6. 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  - discussed diagnosis and progression  - not yet visually significant  - monitor for now   Ophthalmic Meds Ordered this visit:  Meds ordered this encounter  Medications  . Bevacizumab (AVASTIN) SOLN 1.25 mg       Return in about 4 weeks (around 12/12/2018) for f/u HRVO OD, DFE, OCT.  There are no Patient Instructions on file for this visit.   Explained the diagnoses, plan, and follow up with the patient and they expressed understanding.  Patient expressed understanding of the importance of proper follow up care.   This document serves as a record of services personally performed by Karie Chimera, MD, PhD. It was created on their behalf by Laurian Brim, OA, an ophthalmic assistant. The creation of this record is the provider's dictation and/or activities during the visit.    Electronically signed by: Laurian Brim, OA  04.12.2020 11:57 AM    Karie Chimera, M.D., Ph.D. Diseases &  Surgery of the Retina and Vitreous [facility]  I have reviewed the above documentation for accuracy and completeness, and I agree with the above. Karie Chimera, M.D., Ph.D. 11/14/18 11:57 AM    Abbreviations: M myopia (nearsighted); A astigmatism; H hyperopia (farsighted); P presbyopia; Mrx spectacle prescription;  CTL contact lenses; OD right eye; OS left eye; OU both eyes  XT exotropia; ET esotropia; PEK punctate epithelial keratitis; PEE punctate epithelial erosions; DES dry eye syndrome; MGD meibomian gland dysfunction; ATs artificial tears; PFAT's preservative free artificial tears; NSC nuclear sclerotic cataract; PSC posterior subcapsular cataract; ERM epi-retinal membrane; PVD posterior vitreous detachment; RD retinal detachment; DM diabetes mellitus; DR diabetic retinopathy; NPDR non-proliferative diabetic retinopathy; PDR proliferative diabetic retinopathy; CSME clinically significant macular edema; DME diabetic macular edema; dbh dot blot hemorrhages; CWS cotton wool spot; POAG primary open angle glaucoma; C/D cup-to-disc ratio; HVF humphrey visual field; GVF goldmann visual field; OCT optical coherence tomography; IOP intraocular pressure; BRVO Branch retinal vein occlusion; CRVO central retinal vein occlusion; CRAO central retinal artery occlusion; BRAO branch retinal artery occlusion; RT retinal tear; SB scleral buckle; PPV pars plana vitrectomy; VH Vitreous hemorrhage; PRP panretinal laser photocoagulation; IVK intravitreal kenalog; VMT vitreomacular traction; MH Macular hole;  NVD neovascularization of the disc; NVE neovascularization elsewhere; AREDS age related eye disease study; ARMD age related macular degeneration; POAG primary open angle glaucoma; EBMD epithelial/anterior basement membrane dystrophy; ACIOL anterior chamber intraocular lens; IOL intraocular lens; PCIOL posterior chamber intraocular lens; Phaco/IOL phacoemulsification with intraocular lens  placement; PRK photorefractive keratectomy; LASIK laser assisted in situ keratomileusis; HTN hypertension; DM diabetes mellitus; COPD chronic obstructive pulmonary disease

## 2018-11-14 ENCOUNTER — Ambulatory Visit (INDEPENDENT_AMBULATORY_CARE_PROVIDER_SITE_OTHER): Payer: Medicare Other | Admitting: Ophthalmology

## 2018-11-14 ENCOUNTER — Encounter (INDEPENDENT_AMBULATORY_CARE_PROVIDER_SITE_OTHER): Payer: Self-pay | Admitting: Ophthalmology

## 2018-11-14 ENCOUNTER — Other Ambulatory Visit: Payer: Self-pay

## 2018-11-14 DIAGNOSIS — H43821 Vitreomacular adhesion, right eye: Secondary | ICD-10-CM | POA: Diagnosis not present

## 2018-11-14 DIAGNOSIS — H34831 Tributary (branch) retinal vein occlusion, right eye, with macular edema: Secondary | ICD-10-CM | POA: Diagnosis not present

## 2018-11-14 DIAGNOSIS — H3581 Retinal edema: Secondary | ICD-10-CM | POA: Diagnosis not present

## 2018-11-14 DIAGNOSIS — H25813 Combined forms of age-related cataract, bilateral: Secondary | ICD-10-CM

## 2018-11-14 DIAGNOSIS — H35033 Hypertensive retinopathy, bilateral: Secondary | ICD-10-CM

## 2018-11-14 DIAGNOSIS — I10 Essential (primary) hypertension: Secondary | ICD-10-CM

## 2018-11-14 MED ORDER — BEVACIZUMAB CHEMO INJECTION 1.25MG/0.05ML SYRINGE FOR KALEIDOSCOPE
1.2500 mg | INTRAVITREAL | Status: AC | PRN
  Administered 2018-11-14: 12:00:00 1.25 mg via INTRAVITREAL

## 2018-12-12 ENCOUNTER — Encounter (INDEPENDENT_AMBULATORY_CARE_PROVIDER_SITE_OTHER): Payer: Medicare Other | Admitting: Ophthalmology

## 2018-12-22 ENCOUNTER — Other Ambulatory Visit: Payer: Self-pay

## 2018-12-22 ENCOUNTER — Encounter (INDEPENDENT_AMBULATORY_CARE_PROVIDER_SITE_OTHER): Payer: Self-pay | Admitting: Ophthalmology

## 2018-12-22 ENCOUNTER — Ambulatory Visit (INDEPENDENT_AMBULATORY_CARE_PROVIDER_SITE_OTHER): Payer: Medicare Other | Admitting: Ophthalmology

## 2018-12-22 DIAGNOSIS — I10 Essential (primary) hypertension: Secondary | ICD-10-CM | POA: Diagnosis not present

## 2018-12-22 DIAGNOSIS — H25813 Combined forms of age-related cataract, bilateral: Secondary | ICD-10-CM

## 2018-12-22 DIAGNOSIS — H35033 Hypertensive retinopathy, bilateral: Secondary | ICD-10-CM

## 2018-12-22 DIAGNOSIS — H43821 Vitreomacular adhesion, right eye: Secondary | ICD-10-CM | POA: Diagnosis not present

## 2018-12-22 DIAGNOSIS — H3581 Retinal edema: Secondary | ICD-10-CM

## 2018-12-22 DIAGNOSIS — H34831 Tributary (branch) retinal vein occlusion, right eye, with macular edema: Secondary | ICD-10-CM | POA: Diagnosis not present

## 2018-12-22 MED ORDER — BEVACIZUMAB CHEMO INJECTION 1.25MG/0.05ML SYRINGE FOR KALEIDOSCOPE
1.2500 mg | INTRAVITREAL | Status: AC | PRN
  Administered 2018-12-22: 1.25 mg via INTRAVITREAL

## 2018-12-22 NOTE — Progress Notes (Addendum)
[facility] - Clinic Note  12/22/2018     CHIEF COMPLAINT Patient presents for Retina Follow Up   HISTORY OF PRESENT ILLNESS: Brandon Perkins is a 68 y.o. male who presents to the clinic today for:   HPI    Retina Follow Up    Patient presents with  CRVO/BRVO.  In right eye.  I, the attending physician,  performed the HPI with the patient and updated documentation appropriately.          Comments    Patient states his vision is stable.  He states after his last visit, he had some mild pain in his right eye and noticed some intermittent double vision with both eyes open.  Patient also noticed in the last week a lot of redness in the corner of his right eye--thinks its due to pollen.  Patient denies any current eye pain or discomfort.  Patient denies any new or worsening floaters or fol OU.       Last edited by Rennis ChrisZamora, Kinston Magnan, MD on 12/22/2018  8:32 AM. (History)    pt states he had a little bit of pain after the injection at the last visit, he states his eyes have been red off and on, but drops seem to clear   Referring physician: No referring provider defined for this encounter.  HISTORICAL INFORMATION:   Selected notes from the MEDICAL RECORD NUMBER Referred by Dr. Aletta EdouardMy Le for concern of BRVO OD LEE: 04.29.19 (M.Le) [BCVA: OD: 20/400 OS: 20/20] Ocular Hx- PMH-hx of blood clots    CURRENT MEDICATIONS: No current outpatient medications on file. (Ophthalmic Drugs)   No current facility-administered medications for this visit.  (Ophthalmic Drugs)   Current Outpatient Medications (Other)  Medication Sig  . amLODipine (NORVASC) 10 MG tablet Take 1 tablet (10 mg total) by mouth daily.  Marland Kitchen. aspirin EC 81 MG tablet Take 1 tablet (81 mg total) by mouth daily.  . cephALEXin (KEFLEX) 500 MG capsule Take 1 capsule (500 mg total) by mouth every 6 (six) hours. For 8days  . hydrochlorothiazide (HYDRODIURIL) 25 MG tablet Take 1 tablet (25 mg total) by mouth daily.  Marland Kitchen.  oxyCODONE-acetaminophen (PERCOCET/ROXICET) 5-325 MG per tablet Take 1 tablet by mouth every 4 (four) hours as needed for moderate pain.   No current facility-administered medications for this visit.  (Other)      REVIEW OF SYSTEMS: ROS    Positive for: Eyes   Negative for: Constitutional, Gastrointestinal, Neurological, Skin, Genitourinary, Musculoskeletal, HENT, Endocrine, Cardiovascular, Respiratory, Psychiatric, Allergic/Imm, Heme/Lymph   Last edited by Corrinne EagleEnglish, Ashley L on 12/22/2018  8:18 AM. (History)       ALLERGIES Allergies  Allergen Reactions  . Aspirin     When takes a lot of aspirin-- reaction unknown    PAST MEDICAL HISTORY Past Medical History:  Diagnosis Date  . Blood clot in vein    left arm vein blood clot, not know the detail, treated with ASA per pt  . Hypertension    History reviewed. No pertinent surgical history.  FAMILY HISTORY Family History  Problem Relation Age of Onset  . Dementia Mother   . Colon cancer Father   . Stroke Sister        Sister had brain aneurysm  . Diabetes Brother     SOCIAL HISTORY Social History   Tobacco Use  . Smoking status: Never Smoker  . Smokeless tobacco: Never Used  Substance Use Topics  . Alcohol use: No  .  Drug use: No         OPHTHALMIC EXAM:  Base Eye Exam    Visual Acuity (Snellen - Linear)      Right Left   Dist [place] 20/40 -3 20/25 -2   Dist ph [place] 20/NI 20/NI  Pushing OD       Tonometry (Tonopen, 8:16 AM)      Right Left   Pressure 23 23       Pupils      Dark Light Shape React APD   Right 4 3 Round Brisk 0   Left 4 3 Round Brisk 0       Visual Fields      Left Right    Full Full       Extraocular Movement      Right Left    Full, Ortho Full, Ortho       Neuro/Psych    Oriented x3:  Yes   Mood/Affect:  Normal       Dilation    Both eyes:  1.0% Mydriacyl, 2.5% Phenylephrine @ 8:16 AM        Slit Lamp and Fundus Exam    Slit Lamp Exam      Right Left    Lids/Lashes Dermatochalasis - upper lid, mild Meibomian gland dysfunction Dermatochalasis - upper lid, mild Meibomian gland dysfunction   Conjunctiva/Sclera mild nasal Pinguecula, mild Melanosis mild nasal Pinguecula, mild Melanosis   Cornea mild Arcus, 1+ Punctate epithelial erosions mild Arcus, 1+ inferior Punctate epithelial erosions   Anterior Chamber Deep and quiet, narrow angles Deep and quiet, narrow angles   Iris Round and dilated, No NVI Round and dilated, No NVI   Lens 2+ Nuclear sclerosis, 2-3+ Cortical cataract, +vacules 2+ Nuclear sclerosis, 2-3+ Cortical cataract   Vitreous Vitreous syneresis, white vitreous condensations inferior, ?old heme Vitreous syneresis       Fundus Exam      Right Left   Disc Sharp rim, +collateral vessels - nasal/temporal Pink and Sharp   C/D Ratio 0.6 0.6   Macula Blunted foveal reflex, scattered Atrophy, Exudates, +IRH and telangiectatic vessels - improved, interval improvement in CME, subretinal blot heme superior macula - improved Flat, Good foveal reflex, mild Retinal pigment epithelial mottling, scattered CWS, No heme or edema   Vessels Vascular attenuation, Copper wiring, dilated and tortuous venules greatest inferiorly, early sclerosis of inferior arterioles, mild fibrosis along inferior arcades, inferior HRVO Vascular attenuation   Periphery Attached, scattered IRF inferior hemisphere, mild White without pressure superiorly, good PRP inferior hemisphere Attached             IMAGING AND PROCEDURES  Imaging and Procedures for @  OCT, Retina - OU - Both Eyes       Right Eye Quality was good. Central Foveal Thickness: 265. Progression has improved. Findings include no SRF, outer retinal atrophy, epiretinal membrane, normal foveal contour, no IRF (Interval improvement in IRF/cystic changes, Partial PVD, trace cystic changes, trace ERM).   Left Eye Quality was good. Central Foveal Thickness: 287. Progression has been stable. Findings  include normal foveal contour, no SRF, no IRF (Partial PVD).   Notes *Images captured and stored on drive  Diagnosis / Impression:  OD: diffuse macular atrophy; Interval improvement in IRF and interval release of VMT OS: NFP, no IRF/SRF   Clinical management:  See below  Abbreviations: NFP - Normal foveal profile. CME - cystoid macular edema. PED - pigment epithelial detachment. IRF - intraretinal fluid. SRF - subretinal fluid. EZ -  ellipsoid zone. ERM - epiretinal membrane. ORA - outer retinal atrophy. ORT - outer retinal tubulation. SRHM - subretinal hyper-reflective material        Intravitreal Injection, Pharmacologic Agent - OD - Right Eye       Time Out 12/22/2018. 8:50 AM. Confirmed correct patient, procedure, site, and patient consented.   Anesthesia Topical anesthesia was used. Anesthetic medications included Lidocaine 2%, Proparacaine 0.5%.   Procedure Preparation included 5% betadine to ocular surface, eyelid speculum. A supplied needle was used.   Injection:  1.25 mg Bevacizumab (AVASTIN) SOLN   NDC: 78295-621-30, Lot: [id number] , Expiration date: 01/13/2019   Route: Intravitreal, Site: Right Eye, Waste: 0 mL  Post-op Post injection exam found visual acuity of at least counting fingers. The patient tolerated the procedure well. There were no complications. The patient received written and verbal post procedure care education.                 ASSESSMENT/PLAN:    ICD-10-CM   1. Branch retinal vein occlusion of right eye with macular edema H34.8310 Intravitreal Injection, Pharmacologic Agent - OD - Right Eye    Bevacizumab (AVASTIN) SOLN 1.25 mg  2. Retinal edema H35.81 OCT, Retina - OU - Both Eyes  3. Vitreomacular adhesion of right eye H43.821   4. Essential hypertension I10   5. Hypertensive retinopathy of both eyes H35.033   6. Combined forms of age-related cataract of both eyes H25.813     1,2. Remote, inferior HRVO OD  - originally referred  by Dr. 11-24-2001 in April 2019, but pt never presented for consult  - exam shows diffuse macular atrophy, +collateral vessels on the disc, scattered telangectatic vessels inferiorly  - FA (02.25.20)  shows delayed filling, vascular perfusion defects, late leakage  - s/p sectoral PRP OD to inferior hemisphere (02.25.20)  - s/p IVA OD #1 (04.13.20)   - pt is delayed to follow up from 4 weeks to 5 weeks due to forgetting appointment  - OCT with interval improvement in IRF and interval release of VMT  - BCVA improved to 20/40 from 20/70 OD; was 20/200 for Dr. 04.15.20 in April 2019  - BCVA may be limited by central retinal atrophy OD  - recommend IVA OD #2 today, 05.21.20  - pt wishes to proceed  - RBA of procedure discussed, questions answered  - informed consent obtained and signed  - see procedure note  - f/u 5 wks -- DFE/OCT/ possible injection  3. Vitreomacular traction OD - OCT today shows interval release of VMT - monitor  4,5. Hypertensive retinopathy OU - discussed importance of tight BP control - monitor  6. 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  - discussed diagnosis and progression  - not yet visually significant  - monitor for now   Ophthalmic Meds Ordered this visit:  Meds ordered this encounter  Medications  . Bevacizumab (AVASTIN) SOLN 1.25 mg       Return in about 5 weeks (around 01/26/2019) for inferior HRVO OD - Dilated Exam, OCT, Possible Injxn.  There are no Patient Instructions on file for this visit.   Explained the diagnoses, plan, and follow up with the patient and they expressed understanding.  Patient expressed understanding of the importance of proper follow up care.   This document serves as a record of services personally performed by 01/28/2019, MD, PhD. It was created on their behalf by  Laurian Brim, OA, an ophthalmic assistant. The creation of this record  is the provider's dictation and/or activities during the visit.    Electronically signed by: Laurian Brim, OA  05.21.2020 12:02 PM    Karie Chimera, M.D., Ph.D. Diseases & Surgery of the Retina and Vitreous [facility]  I have reviewed the above documentation for accuracy and completeness, and I agree with the above. Karie Chimera, M.D., Ph.D. 12/22/18 12:05 PM    Abbreviations: M myopia (nearsighted); A astigmatism; H hyperopia (farsighted); P presbyopia; Mrx spectacle prescription;  CTL contact lenses; OD right eye; OS left eye; OU both eyes  XT exotropia; ET esotropia; PEK punctate epithelial keratitis; PEE punctate epithelial erosions; DES dry eye syndrome; MGD meibomian gland dysfunction; ATs artificial tears; PFAT's preservative free artificial tears; NSC nuclear sclerotic cataract; PSC posterior subcapsular cataract; ERM epi-retinal membrane; PVD posterior vitreous detachment; RD retinal detachment; DM diabetes mellitus; DR diabetic retinopathy; NPDR non-proliferative diabetic retinopathy; PDR proliferative diabetic retinopathy; CSME clinically significant macular edema; DME diabetic macular edema; dbh dot blot hemorrhages; CWS cotton wool spot; POAG primary open angle glaucoma; C/D cup-to-disc ratio; HVF humphrey visual field; GVF goldmann visual field; OCT optical coherence tomography; IOP intraocular pressure; BRVO Branch retinal vein occlusion; CRVO central retinal vein occlusion; CRAO central retinal artery occlusion; BRAO branch retinal artery occlusion; RT retinal tear; SB scleral buckle; PPV pars plana vitrectomy; VH Vitreous hemorrhage; PRP panretinal laser photocoagulation; IVK intravitreal kenalog; VMT vitreomacular traction; MH Macular hole;  NVD neovascularization of the disc; NVE neovascularization elsewhere; AREDS age related eye disease study; ARMD age related macular degeneration; POAG primary open angle glaucoma; EBMD epithelial/anterior basement  membrane dystrophy; ACIOL anterior chamber intraocular lens; IOL intraocular lens; PCIOL posterior chamber intraocular lens; Phaco/IOL phacoemulsification with intraocular lens placement; PRK photorefractive keratectomy; LASIK laser assisted in situ keratomileusis; HTN hypertension; DM diabetes mellitus; COPD chronic obstructive pulmonary disease

## 2018-12-29 ENCOUNTER — Encounter (INDEPENDENT_AMBULATORY_CARE_PROVIDER_SITE_OTHER): Payer: Medicare Other | Admitting: Ophthalmology

## 2019-01-25 NOTE — Progress Notes (Signed)
[facility] - Clinic Note  01/26/2019     CHIEF COMPLAINT Patient presents for Retina Follow Up   HISTORY OF PRESENT ILLNESS: Brandon Perkins is a 68 y.o. male who presents to the clinic today for:   HPI    Retina Follow Up    Patient presents with  CRVO/BRVO.  In right eye.  Severity is moderate.  Duration of 5 weeks.  Since onset it is gradually improving.  I, the attending physician,  performed the HPI with the patient and updated documentation appropriately.          Comments    Patient states vision improving OD.       Last edited by Rennis ChrisZamora, Ariadne Rissmiller, MD on 01/26/2019  8:26 AM. (History)    pt states he feels like his vision has been okay, but states it seems to fluctuate depending on whether it is cloudy or sunny outside, he states his eyes have also been itching a little  Referring physician: No referring provider defined for this encounter.  HISTORICAL INFORMATION:   Selected notes from the MEDICAL RECORD NUMBER Referred by Dr. Aletta EdouardMy Le for concern of BRVO OD LEE: 04.29.19 (M.Le) [BCVA: OD: 20/400 OS: 20/20] Ocular Hx- PMH-hx of blood clots    CURRENT MEDICATIONS: No current outpatient medications on file. (Ophthalmic Drugs)   No current facility-administered medications for this visit.  (Ophthalmic Drugs)   Current Outpatient Medications (Other)  Medication Sig  . amLODipine (NORVASC) 10 MG tablet Take 1 tablet (10 mg total) by mouth daily.  Marland Kitchen. aspirin EC 81 MG tablet Take 1 tablet (81 mg total) by mouth daily.  . cephALEXin (KEFLEX) 500 MG capsule Take 1 capsule (500 mg total) by mouth every 6 (six) hours. For 8days  . hydrochlorothiazide (HYDRODIURIL) 25 MG tablet Take 1 tablet (25 mg total) by mouth daily.  Marland Kitchen. oxyCODONE-acetaminophen (PERCOCET/ROXICET) 5-325 MG per tablet Take 1 tablet by mouth every 4 (four) hours as needed for moderate pain.   No current facility-administered medications for this visit.  (Other)      REVIEW OF  SYSTEMS: ROS    Positive for: Eyes   Negative for: Constitutional, Gastrointestinal, Neurological, Skin, Genitourinary, Musculoskeletal, HENT, Endocrine, Cardiovascular, Respiratory, Psychiatric, Allergic/Imm, Heme/Lymph   Last edited by Annalee GentaBarber, Daryl D on 01/26/2019  7:54 AM. (History)       ALLERGIES Allergies  Allergen Reactions  . Aspirin     When takes a lot of aspirin-- reaction unknown    PAST MEDICAL HISTORY Past Medical History:  Diagnosis Date  . Blood clot in vein    left arm vein blood clot, not know the detail, treated with ASA per pt  . Hypertension    History reviewed. No pertinent surgical history.  FAMILY HISTORY Family History  Problem Relation Age of Onset  . Dementia Mother   . Colon cancer Father   . Stroke Sister        Sister had brain aneurysm  . Diabetes Brother     SOCIAL HISTORY Social History   Tobacco Use  . Smoking status: Never Smoker  . Smokeless tobacco: Never Used  Substance Use Topics  . Alcohol use: No  . Drug use: No         OPHTHALMIC EXAM:  Base Eye Exam    Visual Acuity (Snellen - Linear)      Right Left   Dist [place] 20/50 -2 20/25   Dist ph [place] NI NI  Tonometry (Tonopen, 8:04 AM)      Right Left   Pressure 19 20       Pupils      Dark Light Shape React APD   Right 4 3 Round Brisk None   Left 4 3 Round Brisk None       Visual Fields (Counting fingers)      Left Right    Full Full       Extraocular Movement      Right Left    Full, Ortho Full, Ortho       Neuro/Psych    Oriented x3: Yes   Mood/Affect: Normal       Dilation    Both eyes: 1.0% Mydriacyl, 2.5% Phenylephrine @ 8:05 AM        Slit Lamp and Fundus Exam    Slit Lamp Exam      Right Left   Lids/Lashes Dermatochalasis - upper lid, mild Meibomian gland dysfunction Dermatochalasis - upper lid, mild Meibomian gland dysfunction   Conjunctiva/Sclera mild nasal Pinguecula, mild Melanosis mild nasal Pinguecula, mild Melanosis   Cornea  mild Arcus, 1+ Punctate epithelial erosions mild Arcus, 1+ inferior Punctate epithelial erosions   Anterior Chamber Deep and quiet, narrow angles Deep and quiet, narrow angles   Iris Round and dilated, No NVI Round and dilated, No NVI   Lens 2+ Nuclear sclerosis, 2-3+ Cortical cataract, +vacules 2+ Nuclear sclerosis, 2-3+ Cortical cataract   Vitreous Vitreous syneresis, white vitreous condensations inferior, ?old heme Vitreous syneresis       Fundus Exam      Right Left   Disc Sharp rim, +collateral vessels - nasal/temporal Pink and Sharp   C/D Ratio 0.6 0.5   Macula Blunted foveal reflex, scattered Atrophy, Exudates, +IRH and telangiectatic vessels - improved, stable improvement in CME Flat, Good foveal reflex, mild Retinal pigment epithelial mottling, scattered CWS, No heme or edema   Vessels Vascular attenuation, Copper wiring, dilated and tortuous venules greatest inferiorly, early sclerosis of inferior arterioles, mild fibrosis along inferior arcades, inferior HRVO Vascular attenuation   Periphery Attached, scattered IRF inferior hemisphere, mild White without pressure superiorly, good PRP inferior hemisphere Attached, few blot hemes nasally        Refraction    Manifest Refraction      Sphere Cylinder Axis Dist [place]   Right -0.50 +0.25 172 20/50   Left              IMAGING AND PROCEDURES  Imaging and Procedures for @TODAY @  OCT, Retina - OU - Both Eyes       Right Eye Quality was good. Central Foveal Thickness: 258. Progression has been stable. Findings include no SRF, outer retinal atrophy, epiretinal membrane, normal foveal contour, no IRF, subretinal hyper-reflective material (Mild cystic changes; partial PVD).   Left Eye Quality was good. Central Foveal Thickness: 284. Progression has been stable. Findings include normal foveal contour, no SRF, no IRF (Partial PVD).   Notes *Images captured and stored on drive  Diagnosis / Impression:  OD: diffuse macular atrophy;  mild cystic changes OS: NFP, no IRF/SRF   Clinical management:  See below  Abbreviations: NFP - Normal foveal profile. CME - cystoid macular edema. PED - pigment epithelial detachment. IRF - intraretinal fluid. SRF - subretinal fluid. EZ - ellipsoid zone. ERM - epiretinal membrane. ORA - outer retinal atrophy. ORT - outer retinal tubulation. SRHM - subretinal hyper-reflective material        Intravitreal Injection, Pharmacologic Agent - OD -  Right Eye       Time Out 01/26/2019. 8:37 AM. Confirmed correct patient, procedure, site, and patient consented.   Anesthesia Topical anesthesia was used. Anesthetic medications included Lidocaine 2%, Proparacaine 0.5%.   Procedure Preparation included 5% betadine to ocular surface, eyelid speculum. A supplied needle was used.   Injection:  1.25 mg Bevacizumab (AVASTIN) SOLN   NDC: 16109-604-5450242-060-01, Lot: [id number] , Expiration date: 03/15/2019   Route: Intravitreal, Site: Right Eye, Waste: 0 mL  Post-op Post injection exam found visual acuity of at least counting fingers. The patient tolerated the procedure well. There were no complications. The patient received written and verbal post procedure care education.                 ASSESSMENT/PLAN:    ICD-10-CM   1. Branch retinal vein occlusion of right eye with macular edema  H34.8310 Intravitreal Injection, Pharmacologic Agent - OD - Right Eye    Bevacizumab (AVASTIN) SOLN 1.25 mg  2. 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 Essential hypertension  I10   5. Hypertensive retinopathy of both eyes  H35.033   6. Combined forms of age-related cataract of both eyes  H25.813     1,2. Remote, inferior HRVO OD  - originally referred by Dr. Conley RollsLe in April 2019, but pt never presented for consult  - exam shows diffuse macular atrophy, +collateral vessels on the disc, scattered telangectatic vessels inferiorly  - FA (02.25.20)   shows delayed filling, vascular perfusion defects, late leakage  - s/p sectoral PRP OD to inferior hemisphere (02.25.20)  - s/p IVA OD #1 (04.13.20), #2 (05.21.20)  - OCT with interval improvement in IRF and interval release of VMT  - BCVA slightly decreased to 20/50 from 20/40 OD; was 20/200 for Dr. Conley RollsLe in April 2019  - BCVA likely limited by central retinal atrophy OD  - recommend IVA OD #3 today, 06.25.20  - pt wishes to proceed  - RBA of procedure discussed, questions answered  - informed consent obtained and signed  - see procedure note  - f/u 4 wks -- DFE/OCT/ possible injection  3. Vitreomacular traction OD - OCT today shows interval release of VMT - monitor  4,5. Hypertensive retinopathy OU - discussed importance of tight BP control - monitor  6. 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  - discussed diagnosis and progression  - not yet visually significant  - monitor for now   Ophthalmic Meds Ordered this visit:  Meds ordered this encounter  Medications  . Bevacizumab (AVASTIN) SOLN 1.25 mg       Return in about 4 weeks (around 02/23/2019) for f/u inf HRVO OD--Dilated Exam, OCT.  There are no Patient Instructions on file for this visit.   Explained the diagnoses, plan, and follow up with the patient and they expressed understanding.  Patient expressed understanding of the importance of proper follow up care.   This document serves as a record of services personally performed by Karie ChimeraBrian G. [place] Huberty, MD, PhD. It was created on their behalf by Laurian BrimAmanda Brown, OA, an ophthalmic assistant. The creation of this record is the provider's dictation and/or activities during the visit.    Electronically signed by: Laurian BrimAmanda Brown, OA  06.24.2020 8:49 AM     Karie ChimeraBrian G. Magnus Crescenzo, M.D., Ph.D. Diseases & Surgery of the Retina and  Vitreous [facility]  I have reviewed  the above documentation for accuracy and completeness, and I agree with the above. Karie ChimeraBrian G. Eartha Vonbehren, M.D., Ph.D. 01/26/19 8:49 AM     Abbreviations: M myopia (nearsighted); A astigmatism; H hyperopia (farsighted); P presbyopia; Mrx spectacle prescription;  CTL contact lenses; OD right eye; OS left eye; OU both eyes  XT exotropia; ET esotropia; PEK punctate epithelial keratitis; PEE punctate epithelial erosions; DES dry eye syndrome; MGD meibomian gland dysfunction; ATs artificial tears; PFAT's preservative free artificial tears; NSC nuclear sclerotic cataract; PSC posterior subcapsular cataract; ERM epi-retinal membrane; PVD posterior vitreous detachment; RD retinal detachment; DM diabetes mellitus; DR diabetic retinopathy; NPDR non-proliferative diabetic retinopathy; PDR proliferative diabetic retinopathy; CSME clinically significant macular edema; DME diabetic macular edema; dbh dot blot hemorrhages; CWS cotton wool spot; POAG primary open angle glaucoma; C/D cup-to-disc ratio; HVF humphrey visual field; GVF goldmann visual field; OCT optical coherence tomography; IOP intraocular pressure; BRVO Branch retinal vein occlusion; CRVO central retinal vein occlusion; CRAO central retinal artery occlusion; BRAO branch retinal artery occlusion; RT retinal tear; SB scleral buckle; PPV pars plana vitrectomy; VH Vitreous hemorrhage; PRP panretinal laser photocoagulation; IVK intravitreal kenalog; VMT vitreomacular traction; MH Macular hole;  NVD neovascularization of the disc; NVE neovascularization elsewhere; AREDS age related eye disease study; ARMD age related macular degeneration; POAG primary open angle glaucoma; EBMD epithelial/anterior basement membrane dystrophy; ACIOL anterior chamber intraocular lens; IOL intraocular lens; PCIOL posterior chamber intraocular lens; Phaco/IOL phacoemulsification with intraocular lens placement; PRK photorefractive keratectomy; LASIK laser assisted in situ keratomileusis; HTN  hypertension; DM diabetes mellitus; COPD chronic obstructive pulmonary disease

## 2019-01-26 ENCOUNTER — Ambulatory Visit (INDEPENDENT_AMBULATORY_CARE_PROVIDER_SITE_OTHER): Payer: Medicare Other | Admitting: Ophthalmology

## 2019-01-26 ENCOUNTER — Encounter (INDEPENDENT_AMBULATORY_CARE_PROVIDER_SITE_OTHER): Payer: Self-pay | Admitting: Ophthalmology

## 2019-01-26 ENCOUNTER — Other Ambulatory Visit: Payer: Self-pay

## 2019-01-26 DIAGNOSIS — H34831 Tributary (branch) retinal vein occlusion, right eye, with macular edema: Secondary | ICD-10-CM | POA: Diagnosis not present

## 2019-01-26 DIAGNOSIS — H25813 Combined forms of age-related cataract, bilateral: Secondary | ICD-10-CM

## 2019-01-26 DIAGNOSIS — I10 Essential (primary) hypertension: Secondary | ICD-10-CM

## 2019-01-26 DIAGNOSIS — H3581 Retinal edema: Secondary | ICD-10-CM | POA: Diagnosis not present

## 2019-01-26 DIAGNOSIS — H35033 Hypertensive retinopathy, bilateral: Secondary | ICD-10-CM

## 2019-01-26 DIAGNOSIS — H43821 Vitreomacular adhesion, right eye: Secondary | ICD-10-CM

## 2019-01-26 MED ORDER — BEVACIZUMAB CHEMO INJECTION 1.25MG/0.05ML SYRINGE FOR KALEIDOSCOPE
1.2500 mg | INTRAVITREAL | Status: AC | PRN
  Administered 2019-01-26: 1.25 mg via INTRAVITREAL

## 2019-02-22 NOTE — Progress Notes (Signed)
[facility] - Clinic Note  02/23/2019     CHIEF COMPLAINT Patient presents for Retina Follow Up   HISTORY OF PRESENT ILLNESS: Brandon Perkins is a 68 y.o. male who presents to the clinic today for:   HPI    Retina Follow Up    Patient presents with  CRVO/BRVO.  In right eye.  This started months ago.  Severity is moderate.  Duration of 4 weeks.  Since onset it is gradually improving.  I, the attending physician,  performed the HPI with the patient and updated documentation appropriately.          Comments    68 y/o male pt here for 4 wk f/u for inf. HRVO OD.  VA OD seems to be getting a little clearer.  [place] in [place].  Denies pain, flashes, floaters.  AT prn OU during the day and gel tears QHS OU.       Last edited by Rennis ChrisZamora, Ricky Gallery, MD on 02/23/2019 11:53 PM. (History)    pt states he feels like his vision has been okay, but states it seems to fluctuate depending on whether it is cloudy or sunny outside, he states his eyes have also been itching a little  Referring physician: No referring provider defined for this encounter.  HISTORICAL INFORMATION:   Selected notes from the MEDICAL RECORD NUMBER Referred by Dr. Aletta EdouardMy Le for concern of BRVO OD LEE: 04.29.19 (M.Le) [BCVA: OD: 20/400 OS: 20/20] Ocular Hx- PMH-hx of blood clots    CURRENT MEDICATIONS: No current outpatient medications on file. (Ophthalmic Drugs)   No current facility-administered medications for this visit.  (Ophthalmic Drugs)   Current Outpatient Medications (Other)  Medication Sig  . amLODipine (NORVASC) 10 MG tablet Take 1 tablet (10 mg total) by mouth daily.  Marland Kitchen. aspirin EC 81 MG tablet Take 1 tablet (81 mg total) by mouth daily.  . cephALEXin (KEFLEX) 500 MG capsule Take 1 capsule (500 mg total) by mouth every 6 (six) hours. For 8days  . hydrochlorothiazide (HYDRODIURIL) 25 MG tablet Take 1 tablet (25 mg total) by mouth daily.  Marland Kitchen. oxyCODONE-acetaminophen (PERCOCET/ROXICET) 5-325 MG  per tablet Take 1 tablet by mouth every 4 (four) hours as needed for moderate pain.   No current facility-administered medications for this visit.  (Other)      REVIEW OF SYSTEMS: ROS    Positive for: Eyes   Negative for: Constitutional, Gastrointestinal, Neurological, Skin, Genitourinary, Musculoskeletal, HENT, Endocrine, Cardiovascular, Respiratory, Psychiatric, Allergic/Imm, Heme/Lymph   Last edited by Celine MansBaxley, Andrew G, COA on 02/23/2019  9:25 AM. (History)       ALLERGIES Allergies  Allergen Reactions  . Aspirin     When takes a lot of aspirin-- reaction unknown    PAST MEDICAL HISTORY Past Medical History:  Diagnosis Date  . Blood clot in vein    left arm vein blood clot, not know the detail, treated with ASA per pt  . Cataract    OU  . Hypertension   . Hypertensive retinopathy    OU   History reviewed. No pertinent surgical history.  FAMILY HISTORY Family History  Problem Relation Age of Onset  . Dementia Mother   . Colon cancer Father   . Stroke Sister        Sister had brain aneurysm  . Diabetes Brother     SOCIAL HISTORY Social History   Tobacco Use  . Smoking status: Never Smoker  . Smokeless tobacco: Never Used  Substance Use Topics  . Alcohol use: No  . Drug use: No         OPHTHALMIC EXAM:  Base Eye Exam    Visual Acuity (Snellen - Linear)      Right Left   Dist [place] 20/40 -2 20/25 -2   Dist ph [place] 20/30 -2 20/20 -2       Tonometry (Tonopen, 9:29 AM)      Right Left   Pressure 18 19       Pupils      Dark Light Shape React APD   Right 4 3 Round Brisk None   Left 4 3 Round Brisk None       Visual Fields (Counting fingers)      Left Right    Full Full       Extraocular Movement      Right Left    Full, Ortho Full, Ortho       Neuro/Psych    Oriented x3: Yes   Mood/Affect: Normal       Dilation    Both eyes: 1.0% Mydriacyl, 2.5% Phenylephrine @ 9:29 AM        Slit Lamp and Fundus Exam    Slit Lamp Exam       Right Left   Lids/Lashes Dermatochalasis - upper lid, mild Meibomian gland dysfunction Dermatochalasis - upper lid, mild Meibomian gland dysfunction   Conjunctiva/Sclera mild nasal Pinguecula, mild Melanosis mild nasal Pinguecula, mild Melanosis   Cornea mild Arcus, 1+ Punctate epithelial erosions mild Arcus, 1+ inferior Punctate epithelial erosions   Anterior Chamber Deep and quiet, narrow angles Deep and quiet, narrow angles   Iris Round and dilated, No NVI Round and dilated, No NVI   Lens 2+ Nuclear sclerosis, 2-3+ Cortical cataract, +vacules 2+ Nuclear sclerosis, 2-3+ Cortical cataract   Vitreous Vitreous syneresis, white vitreous condensations inferior, ?old heme Vitreous syneresis       Fundus Exam      Right Left   Disc Sharp rim, +collateral vessels - nasal/temporal Pink and Sharp   C/D Ratio 0.6 0.5   Macula Blunted foveal reflex, scattered Atrophy, Exudates, +IRH and telangiectatic vessels - improved, stable improvement in CME Flat, Good foveal reflex, mild Retinal pigment epithelial mottling, No heme or edema   Vessels Vascular attenuation, Copper wiring, dilated and tortuous venules greatest inferiorly, early sclerosis of inferior arterioles, mild fibrosis along inferior arcades, inferior HRVO Vascular attenuation   Periphery Attached, scattered IRF inferior hemisphere, mild White without pressure superiorly, good PRP inferior hemisphere Attached, few blot hemes nasally-slight improvement          IMAGING AND PROCEDURES  Imaging and Procedures for @TODAY @  OCT, Retina - OU - Both Eyes       Right Eye Quality was borderline. Central Foveal Thickness: 252. Progression has been stable. Findings include no SRF, outer retinal atrophy, epiretinal membrane, normal foveal contour, no IRF, subretinal hyper-reflective material (stable cystic changes; partial PVD).   Left Eye Quality was good. Central Foveal Thickness: 282. Progression has been stable. Findings include normal foveal  contour, no SRF, no IRF (Partial PVD).   Notes *Images captured and stored on drive  Diagnosis / Impression:  OD: diffuse macular atrophy; stable resolution of IRF OS: NFP, no IRF/SRF--stable   Clinical management:  See below  Abbreviations: NFP - Normal foveal profile. CME - cystoid macular edema. PED - pigment epithelial detachment. IRF - intraretinal fluid. SRF - subretinal fluid. EZ - ellipsoid zone. ERM - epiretinal membrane. ORA -  outer retinal atrophy. ORT - outer retinal tubulation. SRHM - subretinal hyper-reflective material        Intravitreal Injection, Pharmacologic Agent - OD - Right Eye       Time Out 02/23/2019. 10:04 AM. Confirmed correct patient, procedure, site, and patient consented.   Anesthesia Topical anesthesia was used. Anesthetic medications included Lidocaine 2%, Proparacaine 0.5%.   Procedure Preparation included 5% betadine to ocular surface, eyelid speculum. A 30 gauge needle was used.   Injection:  1.25 mg Bevacizumab (AVASTIN) SOLN   NDC: 70360-001-02, Lot: [id number] , Expiration date: 05/04/2019   Route: Intravitreal, Site: Right Eye, Waste: 0 mL  Post-op Post injection exam found visual acuity of at least counting fingers. The patient tolerated the procedure well. There were no complications. The patient received written and verbal post procedure care education. Post injection medications were not given.                 ASSESSMENT/PLAN:    ICD-10-CM   1. Branch retinal vein occlusion of right eye with macular edema  H34.8310 Intravitreal Injection, Pharmacologic Agent - OD - Right Eye    Bevacizumab (AVASTIN) SOLN 1.25 mg  2. 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 Essential hypertension  I10   5. Hypertensive retinopathy of both eyes  H35.033   6. Combined forms of age-related cataract of both eyes  H25.813     1,2. Remote, inferior HRVO OD  - originally  referred by Dr. Conley RollsLe in April 2019, but pt never presented for consult  - exam shows diffuse macular atrophy, +collateral vessels on the disc, scattered telangectatic vessels inferiorly  - FA (02.25.20)  shows delayed filling, vascular perfusion defects, late leakage  - s/p sectoral PRP OD to inferior hemisphere (02.25.20)  - s/p IVA OD #1 (04.13.20), #2 (05.21.20), #3 (06.25.20)  - OCT shows stable resolution of IRF and interval release of VMT  - BCVA slightly improved to 20/30-2 from 20/50  OD; was 20/200 for Dr. Conley RollsLe in April 2019  - BCVA likely limited by central retinal atrophy OD  - recommend IVA OD #4 today, 07.23.20 -- maintenance -- with extension to 6 wks  - pt wishes to proceed  - RBA of procedure discussed, questions answered  - informed consent obtained and signed  - see procedure note  - f/u 6 wks -- DFE/OCT/ possible injection  3. Vitreomacular traction OD - OCT today shows stable release of VMT - monitor  4,5. Hypertensive retinopathy OU - discussed importance of tight BP control - monitor  6. 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  - discussed diagnosis and progression  - not yet visually significant  - monitor for now   Ophthalmic Meds Ordered this visit:  Meds ordered this encounter  Medications  . Bevacizumab (AVASTIN) SOLN 1.25 mg       Return 6 weeks, for DFE, OCT.  There are no Patient Instructions on file for this visit.   Explained the diagnoses, plan, and follow up with the patient and they expressed understanding.  Patient expressed understanding of the importance of proper follow up care.   This document serves as a record of services personally performed by Karie ChimeraBrian G. Shealynn Saulnier, MD, PhD. It was created on their behalf by Laurian BrimAmanda Brown, OA, an ophthalmic assistant. The creation of this record is  the provider's dictation and/or activities during the visit.     Electronically signed by: Laurian BrimAmanda Brown, OA  07.22.2020 11:56 PM    Karie ChimeraBrian G. Kenzlee Fishburn, M.D., Ph.D. Diseases & Surgery of the Retina and Vitreous [facility]  I have reviewed the above documentation for accuracy and completeness, and I agree with the above. Karie ChimeraBrian G. Arcenio Mullaly, M.D., Ph.D. 02/23/19 11:57 PM    Abbreviations: M myopia (nearsighted); A astigmatism; H hyperopia (farsighted); P presbyopia; Mrx spectacle prescription;  CTL contact lenses; OD right eye; OS left eye; OU both eyes  XT exotropia; ET esotropia; PEK punctate epithelial keratitis; PEE punctate epithelial erosions; DES dry eye syndrome; MGD meibomian gland dysfunction; ATs artificial tears; PFAT's preservative free artificial tears; NSC nuclear sclerotic cataract; PSC posterior subcapsular cataract; ERM epi-retinal membrane; PVD posterior vitreous detachment; RD retinal detachment; DM diabetes mellitus; DR diabetic retinopathy; NPDR non-proliferative diabetic retinopathy; PDR proliferative diabetic retinopathy; CSME clinically significant macular edema; DME diabetic macular edema; dbh dot blot hemorrhages; CWS cotton wool spot; POAG primary open angle glaucoma; C/D cup-to-disc ratio; HVF humphrey visual field; GVF goldmann visual field; OCT optical coherence tomography; IOP intraocular pressure; BRVO Branch retinal vein occlusion; CRVO central retinal vein occlusion; CRAO central retinal artery occlusion; BRAO branch retinal artery occlusion; RT retinal tear; SB scleral buckle; PPV pars plana vitrectomy; VH Vitreous hemorrhage; PRP panretinal laser photocoagulation; IVK intravitreal kenalog; VMT vitreomacular traction; MH Macular hole;  NVD neovascularization of the disc; NVE neovascularization elsewhere; AREDS age related eye disease study; ARMD age related macular degeneration; POAG primary open angle glaucoma; EBMD epithelial/anterior basement membrane dystrophy; ACIOL anterior chamber intraocular lens; IOL  intraocular lens; PCIOL posterior chamber intraocular lens; Phaco/IOL phacoemulsification with intraocular lens placement; PRK photorefractive keratectomy; LASIK laser assisted in situ keratomileusis; HTN hypertension; DM diabetes mellitus; COPD chronic obstructive pulmonary disease

## 2019-02-23 ENCOUNTER — Other Ambulatory Visit: Payer: Self-pay

## 2019-02-23 ENCOUNTER — Encounter (INDEPENDENT_AMBULATORY_CARE_PROVIDER_SITE_OTHER): Payer: Self-pay | Admitting: Ophthalmology

## 2019-02-23 ENCOUNTER — Ambulatory Visit (INDEPENDENT_AMBULATORY_CARE_PROVIDER_SITE_OTHER): Payer: Medicare Other | Admitting: Ophthalmology

## 2019-02-23 DIAGNOSIS — I10 Essential (primary) hypertension: Secondary | ICD-10-CM | POA: Diagnosis not present

## 2019-02-23 DIAGNOSIS — H25813 Combined forms of age-related cataract, bilateral: Secondary | ICD-10-CM

## 2019-02-23 DIAGNOSIS — H43821 Vitreomacular adhesion, right eye: Secondary | ICD-10-CM | POA: Diagnosis not present

## 2019-02-23 DIAGNOSIS — H3581 Retinal edema: Secondary | ICD-10-CM

## 2019-02-23 DIAGNOSIS — H34831 Tributary (branch) retinal vein occlusion, right eye, with macular edema: Secondary | ICD-10-CM | POA: Diagnosis not present

## 2019-02-23 DIAGNOSIS — H35033 Hypertensive retinopathy, bilateral: Secondary | ICD-10-CM

## 2019-02-23 MED ORDER — BEVACIZUMAB CHEMO INJECTION 1.25MG/0.05ML SYRINGE FOR KALEIDOSCOPE
1.2500 mg | INTRAVITREAL | Status: AC | PRN
  Administered 2019-02-23: 1.25 mg via INTRAVITREAL

## 2019-04-06 ENCOUNTER — Encounter (INDEPENDENT_AMBULATORY_CARE_PROVIDER_SITE_OTHER): Payer: Medicare Other | Admitting: Ophthalmology

## 2019-04-10 NOTE — Progress Notes (Signed)
[facility] - Clinic Note  04/11/2019     CHIEF COMPLAINT Patient presents for Retina Follow Up   HISTORY OF PRESENT ILLNESS: Brandon Perkins is a 68 y.o. male who presents to the clinic today for:   HPI    Retina Follow Up    Patient presents with  Other.  In both eyes.  This started 6 weeks ago.  Severity is moderate.          Comments    Patient here for 6 weeks retina follow up for DFE, OCT. Patient states vision about the same. OD has an eye ache and a little itch temporally. OS no so bad.        Last edited by Laddie Aquaslarke, Rebecca S, COA on 04/11/2019 10:00 AM. (History)    pt states his right eye aches and itches a little bit, he is using OTC AT's PRN  Referring physician: No referring provider defined for this encounter.  HISTORICAL INFORMATION:   Selected notes from the MEDICAL RECORD NUMBER Referred by Dr. Aletta EdouardMy Le for concern of BRVO OD LEE: 04.29.19 (M.Le) [BCVA: OD: 20/400 OS: 20/20] Ocular Hx- PMH-hx of blood clots    CURRENT MEDICATIONS: No current outpatient medications on file. (Ophthalmic Drugs)   No current facility-administered medications for this visit.  (Ophthalmic Drugs)   Current Outpatient Medications (Other)  Medication Sig  . amLODipine (NORVASC) 10 MG tablet Take 1 tablet (10 mg total) by mouth daily.  Marland Kitchen. aspirin EC 81 MG tablet Take 1 tablet (81 mg total) by mouth daily.  . cephALEXin (KEFLEX) 500 MG capsule Take 1 capsule (500 mg total) by mouth every 6 (six) hours. For 8days  . hydrochlorothiazide (HYDRODIURIL) 25 MG tablet Take 1 tablet (25 mg total) by mouth daily.  Marland Kitchen. oxyCODONE-acetaminophen (PERCOCET/ROXICET) 5-325 MG per tablet Take 1 tablet by mouth every 4 (four) hours as needed for moderate pain.   No current facility-administered medications for this visit.  (Other)      REVIEW OF SYSTEMS: ROS    Positive for: Eyes   Negative for: Constitutional, Gastrointestinal, Neurological, Skin, Genitourinary,  Musculoskeletal, HENT, Endocrine, Cardiovascular, Respiratory, Psychiatric, Allergic/Imm, Heme/Lymph   Last edited by Laddie Aquaslarke, Rebecca S, COA on 04/11/2019 10:00 AM. (History)       ALLERGIES Allergies  Allergen Reactions  . Aspirin     When takes a lot of aspirin-- reaction unknown    PAST MEDICAL HISTORY Past Medical History:  Diagnosis Date  . Blood clot in vein    left arm vein blood clot, not know the detail, treated with ASA per pt  . Cataract    OU  . Hypertension   . Hypertensive retinopathy    OU   History reviewed. No pertinent surgical history.  FAMILY HISTORY Family History  Problem Relation Age of Onset  . Dementia Mother   . Colon cancer Father   . Stroke Sister        Sister had brain aneurysm  . Diabetes Brother     SOCIAL HISTORY Social History   Tobacco Use  . Smoking status: Never Smoker  . Smokeless tobacco: Never Used  Substance Use Topics  . Alcohol use: No  . Drug use: No         OPHTHALMIC EXAM:  Base Eye Exam    Visual Acuity (Snellen - Linear)      Right Left   Dist [place] 20/40 20/30 -1   Dist ph [place] 20/20 -1  Tonometry (Tonopen, 9:55 AM)      Right Left   Pressure 22 22       Pupils      Dark Light Shape React APD   Right 4 3 Round Brisk None   Left 4 3 Round Brisk None       Visual Fields (Counting fingers)      Left Right    Full Full       Extraocular Movement      Right Left    Full, Ortho Full, Ortho       Neuro/Psych    Oriented x3: Yes   Mood/Affect: Normal       Dilation    Both eyes: 1.0% Mydriacyl, 2.5% Phenylephrine @ 9:54 AM        Slit Lamp and Fundus Exam    Slit Lamp Exam      Right Left   Lids/Lashes Dermatochalasis - upper lid, mild Meibomian gland dysfunction Dermatochalasis - upper lid, mild Meibomian gland dysfunction   Conjunctiva/Sclera mild nasal Pinguecula, mild Melanosis mild nasal Pinguecula, mild Melanosis   Cornea mild Arcus, 1+ Punctate epithelial erosions mild  Arcus, 1+ inferior Punctate epithelial erosions   Anterior Chamber Deep and quiet, narrow angles Deep and quiet, narrow angles   Iris Round and dilated, No NVI Round and dilated, No NVI   Lens 2+ Nuclear sclerosis, 2-3+ Cortical cataract, +vacules 2+ Nuclear sclerosis, 2-3+ Cortical cataract   Vitreous Vitreous syneresis, white vitreous condensations inferior, ?old heme Vitreous syneresis       Fundus Exam      Right Left   Disc Sharp rim, +collateral vessels - nasal/temporal Pink and Sharp   C/D Ratio 0.6 0.5   Macula Blunted foveal reflex, scattered Atrophy, Exudates, +IRH and telangiectatic vessels - improved, mild, persistent CME IT macula Flat, Good foveal reflex, mild Retinal pigment epithelial mottling, No heme or edema   Vessels Vascular attenuation, Copper wiring, dilated and tortuous venules greatest inferiorly, early sclerosis of inferior arterioles, mild fibrosis along inferior arcades, inferior HRVO Vascular attenuation   Periphery Attached, scattered IRF inferior hemisphere -- improved, mild White without pressure superiorly, good PRP inferior hemisphere Attached, few blot hemes nasally-slight improvement          IMAGING AND PROCEDURES  Imaging and Procedures for @TODAY @  OCT, Retina - OU - Both Eyes       Right Eye Quality was good. Central Foveal Thickness: 252. Progression has worsened. Findings include no SRF, outer retinal atrophy, epiretinal membrane, normal foveal contour, no IRF, subretinal hyper-reflective material (Interval increase in IRF caught on widefield; partial PVD).   Left Eye Quality was good. Central Foveal Thickness: 284. Progression has been stable. Findings include normal foveal contour, no SRF, no IRF (Partial PVD).   Notes *Images captured and stored on drive  Diagnosis / Impression:  OD: Mild interval increase in IRF inf temporal macula caught on widefield; partial PVD OS: NFP, no IRF/SRF--stable   Clinical management:  See  below  Abbreviations: NFP - Normal foveal profile. CME - cystoid macular edema. PED - pigment epithelial detachment. IRF - intraretinal fluid. SRF - subretinal fluid. EZ - ellipsoid zone. ERM - epiretinal membrane. ORA - outer retinal atrophy. ORT - outer retinal tubulation. SRHM - subretinal hyper-reflective material        Intravitreal Injection, Pharmacologic Agent - OD - Right Eye       Time Out 04/11/2019. 11:05 AM. Confirmed correct patient, procedure, site, and patient consented.   Anesthesia Topical  anesthesia was used. Anesthetic medications included Lidocaine 2%, Proparacaine 0.5%.   Procedure Preparation included 5% betadine to ocular surface, eyelid speculum. A supplied needle was used.   Injection:  1.25 mg Bevacizumab (AVASTIN) SOLN   NDC: 40981-191-4750242-060-01, Lot: [id number] , Expiration date: 05/31/2019   Route: Intravitreal, Site: Right Eye, Waste: 0 mL  Post-op Post injection exam found visual acuity of at least counting fingers. The patient tolerated the procedure well. There were no complications. The patient received written and verbal post procedure care education.                 ASSESSMENT/PLAN:    ICD-10-CM   1. Branch retinal vein occlusion of right eye with macular edema  H34.8310 Intravitreal Injection, Pharmacologic Agent - OD - Right Eye    Bevacizumab (AVASTIN) SOLN 1.25 mg  2. 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 Essential hypertension  I10   5. Hypertensive retinopathy of both eyes  H35.033   6. Combined forms of age-related cataract of both eyes  H25.813     1,2. Remote, inferior HRVO OD  - originally referred by Dr. Conley RollsLe in April 2019, but pt never presented for consult  - exam shows diffuse macular atrophy, +collateral vessels on the disc, scattered telangectatic vessels inferiorly  - FA (02.25.20)  shows delayed filling, vascular perfusion defects, late leakage  - s/p  sectoral PRP OD to inferior hemisphere (02.25.20)  - s/p IVA OD #1 (04.13.20), #2 (05.21.20), #3 (06.25.20), #4 (07.23.20)  - OCT shows stable resolution of IRF centrally and interval release of VMT, but interval increase in IRF inf temp macula  - BCVA stable improvement to 20/40 from 20/50 OD; was 20/200 for Dr. Conley RollsLe in April 2019  - BCVA likely limited by central retinal atrophy OD  - recommend IVA OD #5 today, 09.08.20 -- maintenance and to treat inf temp IRF   - pt wishes to proceed  - RBA of procedure discussed, questions answered  - informed consent obtained and signed  - see procedure note  - f/u 6 wks -- DFE/OCT/ possible injection  3. Vitreomacular traction OD  - OCT today shows stable release of VMT  - monitor  4,5. Hypertensive retinopathy OU  - discussed importance of tight BP control  - monitor  6. 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  - discussed diagnosis and progression  - not yet visually significant  - monitor for now   Ophthalmic Meds Ordered this visit:  Meds ordered this encounter  Medications  . Bevacizumab (AVASTIN) SOLN 1.25 mg       Return in about 6 weeks (around 05/23/2019) for f/u HRVO OD, DFE, OCT.  There are no Patient Instructions on file for this visit.   Explained the diagnoses, plan, and follow up with the patient and they expressed understanding.  Patient expressed understanding of the importance of proper follow up care.   This document serves as a record of services personally performed by Karie ChimeraBrian G. Jaykwon Morones, MD, PhD. It was created on their behalf by Laurian BrimAmanda Brown, OA, an ophthalmic assistant. The creation of this record is the provider's dictation and/or activities during the visit.    Electronically signed by: Laurian BrimAmanda Brown, OA 09.07.2020 12:49 PM    Karie ChimeraBrian G. Dejohn Ibarra, M.D., Ph.D. Diseases & Surgery of the Retina and Vitreous [facility]  I have reviewed the above documentation for accuracy and completeness, and I agree with the above. Karie Chimera, M.D., Ph.D. 04/11/19 12:49 PM    Abbreviations: M myopia (nearsighted); A astigmatism; H hyperopia (farsighted); P presbyopia; Mrx spectacle prescription;  CTL contact lenses; OD right eye; OS left eye; OU both eyes  XT exotropia; ET esotropia; PEK punctate epithelial keratitis; PEE punctate epithelial erosions; DES dry eye syndrome; MGD meibomian gland dysfunction; ATs artificial tears; PFAT's preservative free artificial tears; NSC nuclear sclerotic cataract; PSC posterior subcapsular cataract; ERM epi-retinal membrane; PVD posterior vitreous detachment; RD retinal detachment; DM diabetes mellitus; DR diabetic retinopathy; NPDR non-proliferative diabetic retinopathy; PDR proliferative diabetic retinopathy; CSME clinically significant macular edema; DME diabetic macular edema; dbh dot blot hemorrhages; CWS cotton wool spot; POAG primary open angle glaucoma; C/D cup-to-disc ratio; HVF humphrey visual field; GVF goldmann visual field; OCT optical coherence tomography; IOP intraocular pressure; BRVO Branch retinal vein occlusion; CRVO central retinal vein occlusion; CRAO central retinal artery occlusion; BRAO branch retinal artery occlusion; RT retinal tear; SB scleral buckle; PPV pars plana vitrectomy; VH Vitreous hemorrhage; PRP panretinal laser photocoagulation; IVK intravitreal kenalog; VMT vitreomacular traction; MH Macular hole;  NVD neovascularization of the disc; NVE neovascularization elsewhere; AREDS age related eye disease study; ARMD age related macular degeneration; POAG primary open angle glaucoma; EBMD epithelial/anterior basement membrane dystrophy; ACIOL anterior chamber intraocular lens; IOL intraocular lens; PCIOL posterior chamber intraocular lens; Phaco/IOL phacoemulsification with intraocular lens placement; PRK photorefractive keratectomy; LASIK laser  assisted in situ keratomileusis; HTN hypertension; DM diabetes mellitus; COPD chronic obstructive pulmonary disease

## 2019-04-11 ENCOUNTER — Other Ambulatory Visit: Payer: Self-pay

## 2019-04-11 ENCOUNTER — Ambulatory Visit (INDEPENDENT_AMBULATORY_CARE_PROVIDER_SITE_OTHER): Payer: Medicare Other | Admitting: Ophthalmology

## 2019-04-11 ENCOUNTER — Encounter (INDEPENDENT_AMBULATORY_CARE_PROVIDER_SITE_OTHER): Payer: Self-pay | Admitting: Ophthalmology

## 2019-04-11 DIAGNOSIS — H25813 Combined forms of age-related cataract, bilateral: Secondary | ICD-10-CM

## 2019-04-11 DIAGNOSIS — H43821 Vitreomacular adhesion, right eye: Secondary | ICD-10-CM

## 2019-04-11 DIAGNOSIS — I10 Essential (primary) hypertension: Secondary | ICD-10-CM | POA: Diagnosis not present

## 2019-04-11 DIAGNOSIS — H3581 Retinal edema: Secondary | ICD-10-CM | POA: Diagnosis not present

## 2019-04-11 DIAGNOSIS — H35033 Hypertensive retinopathy, bilateral: Secondary | ICD-10-CM

## 2019-04-11 DIAGNOSIS — H34831 Tributary (branch) retinal vein occlusion, right eye, with macular edema: Secondary | ICD-10-CM

## 2019-04-11 MED ORDER — BEVACIZUMAB CHEMO INJECTION 1.25MG/0.05ML SYRINGE FOR KALEIDOSCOPE
1.2500 mg | INTRAVITREAL | Status: AC | PRN
  Administered 2019-04-11: 1.25 mg via INTRAVITREAL

## 2019-05-23 ENCOUNTER — Encounter (INDEPENDENT_AMBULATORY_CARE_PROVIDER_SITE_OTHER): Payer: Self-pay

## 2019-05-23 ENCOUNTER — Encounter (INDEPENDENT_AMBULATORY_CARE_PROVIDER_SITE_OTHER): Payer: Medicare Other | Admitting: Ophthalmology

## 2019-12-24 ENCOUNTER — Other Ambulatory Visit: Payer: Self-pay

## 2019-12-24 ENCOUNTER — Encounter (HOSPITAL_COMMUNITY): Payer: Self-pay

## 2019-12-24 ENCOUNTER — Inpatient Hospital Stay (HOSPITAL_COMMUNITY): Payer: Medicare Other

## 2019-12-24 ENCOUNTER — Emergency Department (HOSPITAL_COMMUNITY): Payer: Medicare Other

## 2019-12-24 ENCOUNTER — Inpatient Hospital Stay (HOSPITAL_COMMUNITY)
Admission: EM | Admit: 2019-12-24 | Discharge: 2019-12-29 | DRG: 065 | Disposition: A | Discharge status: Rehabilitation Facility | Payer: Medicare Other | Attending: Internal Medicine | Admitting: Internal Medicine

## 2019-12-24 ENCOUNTER — Ambulatory Visit (INDEPENDENT_AMBULATORY_CARE_PROVIDER_SITE_OTHER)
Admission: EM | Admit: 2019-12-24 | Discharge: 2019-12-24 | Disposition: A | Discharge status: Other Acute Inpatient Hospital | Payer: Medicare Other | Source: Home / Self Care

## 2019-12-24 DIAGNOSIS — I69322 Dysarthria following cerebral infarction: Secondary | ICD-10-CM

## 2019-12-24 DIAGNOSIS — I160 Hypertensive urgency: Secondary | ICD-10-CM | POA: Diagnosis present

## 2019-12-24 DIAGNOSIS — I672 Cerebral atherosclerosis: Secondary | ICD-10-CM | POA: Diagnosis present

## 2019-12-24 DIAGNOSIS — R479 Unspecified speech disturbances: Secondary | ICD-10-CM

## 2019-12-24 DIAGNOSIS — Z800 Family history of malignant neoplasm of digestive organs: Secondary | ICD-10-CM

## 2019-12-24 DIAGNOSIS — Z86718 Personal history of other venous thrombosis and embolism: Secondary | ICD-10-CM

## 2019-12-24 DIAGNOSIS — Z823 Family history of stroke: Secondary | ICD-10-CM | POA: Diagnosis not present

## 2019-12-24 DIAGNOSIS — H35033 Hypertensive retinopathy, bilateral: Secondary | ICD-10-CM | POA: Diagnosis present

## 2019-12-24 DIAGNOSIS — R451 Restlessness and agitation: Secondary | ICD-10-CM | POA: Diagnosis not present

## 2019-12-24 DIAGNOSIS — R29702 NIHSS score 2: Secondary | ICD-10-CM | POA: Diagnosis present

## 2019-12-24 DIAGNOSIS — R4701 Aphasia: Secondary | ICD-10-CM | POA: Diagnosis present

## 2019-12-24 DIAGNOSIS — I6330 Cerebral infarction due to thrombosis of unspecified cerebral artery: Secondary | ICD-10-CM | POA: Insufficient documentation

## 2019-12-24 DIAGNOSIS — I63341 Cerebral infarction due to thrombosis of right cerebellar artery: Principal | ICD-10-CM | POA: Diagnosis present

## 2019-12-24 DIAGNOSIS — Z886 Allergy status to analgesic agent status: Secondary | ICD-10-CM | POA: Diagnosis not present

## 2019-12-24 DIAGNOSIS — E875 Hyperkalemia: Secondary | ICD-10-CM | POA: Diagnosis present

## 2019-12-24 DIAGNOSIS — R278 Other lack of coordination: Secondary | ICD-10-CM | POA: Diagnosis present

## 2019-12-24 DIAGNOSIS — Z79899 Other long term (current) drug therapy: Secondary | ICD-10-CM | POA: Diagnosis not present

## 2019-12-24 DIAGNOSIS — I10 Essential (primary) hypertension: Secondary | ICD-10-CM | POA: Diagnosis present

## 2019-12-24 DIAGNOSIS — G8194 Hemiplegia, unspecified affecting left nondominant side: Secondary | ICD-10-CM | POA: Diagnosis present

## 2019-12-24 DIAGNOSIS — E785 Hyperlipidemia, unspecified: Secondary | ICD-10-CM | POA: Diagnosis present

## 2019-12-24 DIAGNOSIS — I161 Hypertensive emergency: Secondary | ICD-10-CM | POA: Diagnosis present

## 2019-12-24 DIAGNOSIS — I639 Cerebral infarction, unspecified: Secondary | ICD-10-CM | POA: Diagnosis not present

## 2019-12-24 DIAGNOSIS — Z833 Family history of diabetes mellitus: Secondary | ICD-10-CM | POA: Diagnosis not present

## 2019-12-24 DIAGNOSIS — I651 Occlusion and stenosis of basilar artery: Secondary | ICD-10-CM | POA: Diagnosis present

## 2019-12-24 DIAGNOSIS — R001 Bradycardia, unspecified: Secondary | ICD-10-CM | POA: Diagnosis present

## 2019-12-24 DIAGNOSIS — Z20822 Contact with and (suspected) exposure to covid-19: Secondary | ICD-10-CM | POA: Diagnosis present

## 2019-12-24 DIAGNOSIS — Z7982 Long term (current) use of aspirin: Secondary | ICD-10-CM | POA: Diagnosis not present

## 2019-12-24 DIAGNOSIS — R531 Weakness: Secondary | ICD-10-CM | POA: Diagnosis not present

## 2019-12-24 DIAGNOSIS — N179 Acute kidney failure, unspecified: Secondary | ICD-10-CM | POA: Diagnosis not present

## 2019-12-24 DIAGNOSIS — I351 Nonrheumatic aortic (valve) insufficiency: Secondary | ICD-10-CM | POA: Diagnosis not present

## 2019-12-24 DIAGNOSIS — R471 Dysarthria and anarthria: Secondary | ICD-10-CM | POA: Diagnosis present

## 2019-12-24 DIAGNOSIS — I63541 Cerebral infarction due to unspecified occlusion or stenosis of right cerebellar artery: Secondary | ICD-10-CM | POA: Diagnosis not present

## 2019-12-24 LAB — I-STAT CHEM 8, ED
BUN: 15 mg/dL (ref 8–23)
Calcium, Ion: 0.99 mmol/L — ABNORMAL LOW (ref 1.15–1.40)
Chloride: 106 mmol/L (ref 98–111)
Creatinine, Ser: 1 mg/dL (ref 0.61–1.24)
Glucose, Bld: 94 mg/dL (ref 70–99)
HCT: 41 % (ref 39.0–52.0)
Hemoglobin: 13.9 g/dL (ref 13.0–17.0)
Potassium: 5.7 mmol/L — ABNORMAL HIGH (ref 3.5–5.1)
Sodium: 136 mmol/L (ref 135–145)
TCO2: 27 mmol/L (ref 22–32)

## 2019-12-24 LAB — CBC WITH DIFFERENTIAL/PLATELET
Abs Immature Granulocytes: 0.01 10*3/uL (ref 0.00–0.07)
Basophils Absolute: 0 10*3/uL (ref 0.0–0.1)
Basophils Relative: 1 %
Eosinophils Absolute: 0.1 10*3/uL (ref 0.0–0.5)
Eosinophils Relative: 1 %
HCT: 42.6 % (ref 39.0–52.0)
Hemoglobin: 13 g/dL (ref 13.0–17.0)
Immature Granulocytes: 0 %
Lymphocytes Relative: 36 %
Lymphs Abs: 1.8 10*3/uL (ref 0.7–4.0)
MCH: 25.7 pg — ABNORMAL LOW (ref 26.0–34.0)
MCHC: 30.5 g/dL (ref 30.0–36.0)
MCV: 84.4 fL (ref 80.0–100.0)
Monocytes Absolute: 0.5 10*3/uL (ref 0.1–1.0)
Monocytes Relative: 9 %
Neutro Abs: 2.7 10*3/uL (ref 1.7–7.7)
Neutrophils Relative %: 53 %
Platelets: 179 10*3/uL (ref 150–400)
RBC: 5.05 MIL/uL (ref 4.22–5.81)
RDW: 15.9 % — ABNORMAL HIGH (ref 11.5–15.5)
WBC: 5 10*3/uL (ref 4.0–10.5)
nRBC: 0 % (ref 0.0–0.2)

## 2019-12-24 LAB — RAPID URINE DRUG SCREEN, HOSP PERFORMED
Amphetamines: NOT DETECTED
Barbiturates: NOT DETECTED
Benzodiazepines: NOT DETECTED
Cocaine: NOT DETECTED
Opiates: NOT DETECTED
Tetrahydrocannabinol: NOT DETECTED

## 2019-12-24 LAB — BASIC METABOLIC PANEL
Anion gap: 11 (ref 5–15)
BUN: 9 mg/dL (ref 8–23)
CO2: 25 mmol/L (ref 22–32)
Calcium: 9.3 mg/dL (ref 8.9–10.3)
Chloride: 103 mmol/L (ref 98–111)
Creatinine, Ser: 0.9 mg/dL (ref 0.61–1.24)
GFR calc Af Amer: >60 mL/min (ref >60)
GFR calc non Af Amer: >60 mL/min (ref >60)
Glucose, Bld: 124 mg/dL — ABNORMAL HIGH (ref 70–99)
Potassium: 3.9 mmol/L (ref 3.5–5.1)
Sodium: 139 mmol/L (ref 135–145)

## 2019-12-24 LAB — COMPREHENSIVE METABOLIC PANEL
ALT: 15 U/L (ref 0–44)
AST: 45 U/L — ABNORMAL HIGH (ref 15–41)
Albumin: 3.3 g/dL — ABNORMAL LOW (ref 3.5–5.0)
Alkaline Phosphatase: 49 U/L (ref 38–126)
Anion gap: 9 (ref 5–15)
BUN: 11 mg/dL (ref 8–23)
CO2: 26 mmol/L (ref 22–32)
Calcium: 8.8 mg/dL — ABNORMAL LOW (ref 8.9–10.3)
Chloride: 104 mmol/L (ref 98–111)
Creatinine, Ser: 1.01 mg/dL (ref 0.61–1.24)
GFR calc Af Amer: >60 mL/min (ref >60)
GFR calc non Af Amer: >60 mL/min (ref >60)
Glucose, Bld: 101 mg/dL — ABNORMAL HIGH (ref 70–99)
Potassium: 5.8 mmol/L — ABNORMAL HIGH (ref 3.5–5.1)
Sodium: 139 mmol/L (ref 135–145)
Total Bilirubin: 1.6 mg/dL — ABNORMAL HIGH (ref 0.3–1.2)
Total Protein: 6.6 g/dL (ref 6.5–8.1)

## 2019-12-24 LAB — URINALYSIS, ROUTINE W REFLEX MICROSCOPIC
Bilirubin Urine: NEGATIVE
Glucose, UA: NEGATIVE mg/dL
Hgb urine dipstick: NEGATIVE
Ketones, ur: NEGATIVE mg/dL
Nitrite: NEGATIVE
Protein, ur: NEGATIVE mg/dL
Specific Gravity, Urine: 1.018 (ref 1.005–1.030)
pH: 7 (ref 5.0–8.0)

## 2019-12-24 LAB — MAGNESIUM: Magnesium: 1.7 mg/dL (ref 1.7–2.4)

## 2019-12-24 LAB — SARS CORONAVIRUS 2 BY RT PCR (HOSPITAL ORDER, PERFORMED IN ~~LOC~~ HOSPITAL LAB): SARS Coronavirus 2: NEGATIVE

## 2019-12-24 LAB — CBG MONITORING, ED: Glucose-Capillary: 88 mg/dL (ref 70–99)

## 2019-12-24 LAB — ETHANOL: Alcohol, Ethyl (B): <10 mg/dL (ref <10)

## 2019-12-24 MED ORDER — ONDANSETRON HCL 4 MG/2ML IJ SOLN
4.0000 mg | Freq: Once | INTRAMUSCULAR | Status: AC
  Administered 2019-12-24: 4 mg via INTRAVENOUS
  Filled 2019-12-24: qty 2

## 2019-12-24 MED ORDER — FUROSEMIDE 10 MG/ML IJ SOLN
40.0000 mg | Freq: Once | INTRAMUSCULAR | Status: AC
  Administered 2019-12-24: 40 mg via INTRAVENOUS
  Filled 2019-12-24: qty 4

## 2019-12-24 MED ORDER — STROKE: EARLY STAGES OF RECOVERY BOOK: Freq: Once | Status: DC

## 2019-12-24 MED ORDER — DOCUSATE SODIUM 100 MG PO CAPS: 100.0000 mg | ORAL_CAPSULE | Freq: Two times a day (BID) | ORAL | Status: DC | PRN

## 2019-12-24 MED ORDER — AMLODIPINE BESYLATE 10 MG PO TABS
10.0000 mg | ORAL_TABLET | Freq: Every day | ORAL | Status: DC
  Administered 2019-12-24 – 2019-12-29 (×6): 10 mg via ORAL
  Filled 2019-12-24: qty 1
  Filled 2019-12-24: qty 2
  Filled 2019-12-24 (×4): qty 1

## 2019-12-24 MED ORDER — CALCIUM GLUCONATE-NACL 1-0.675 GM/50ML-% IV SOLN
1.0000 g | Freq: Once | INTRAVENOUS | Status: AC
  Administered 2019-12-24: 1000 mg via INTRAVENOUS
  Filled 2019-12-24: qty 50

## 2019-12-24 MED ORDER — ENOXAPARIN SODIUM 40 MG/0.4ML ~~LOC~~ SOLN
40.0000 mg | SUBCUTANEOUS | Status: DC
  Administered 2019-12-25 – 2019-12-28 (×4): 40 mg via SUBCUTANEOUS
  Filled 2019-12-24 (×3): qty 0.4

## 2019-12-24 MED ORDER — IOHEXOL 350 MG/ML SOLN
75.0000 mL | Freq: Once | INTRAVENOUS | Status: AC | PRN
  Administered 2019-12-24: 75 mL via INTRAVENOUS

## 2019-12-24 MED ORDER — MAGNESIUM SULFATE IN D5W 1-5 GM/100ML-% IV SOLN
1.0000 g | Freq: Once | INTRAVENOUS | Status: AC
  Administered 2019-12-24: 1 g via INTRAVENOUS
  Filled 2019-12-24: qty 100

## 2019-12-24 MED ORDER — POLYETHYLENE GLYCOL 3350 17 G PO PACK: 17.0000 g | PACK | Freq: Every day | ORAL | Status: DC | PRN

## 2019-12-24 MED ORDER — CLEVIDIPINE BUTYRATE 0.5 MG/ML IV EMUL
0.0000 mg/h | INTRAVENOUS | Status: DC
  Administered 2019-12-24: 19 mg/h via INTRAVENOUS
  Administered 2019-12-24: 1 mg/h via INTRAVENOUS
  Administered 2019-12-24: 21 mg/h via INTRAVENOUS
  Administered 2019-12-25: 10 mg/h via INTRAVENOUS
  Administered 2019-12-25 (×2): 8 mg/h via INTRAVENOUS
  Administered 2019-12-25: 10 mg/h via INTRAVENOUS
  Administered 2019-12-25: 5 mg/h via INTRAVENOUS
  Filled 2019-12-24 (×2): qty 50
  Filled 2019-12-24: qty 100
  Filled 2019-12-24: qty 50
  Filled 2019-12-24: qty 100
  Filled 2019-12-24: qty 50
  Filled 2019-12-24: qty 100

## 2019-12-24 NOTE — ED Triage Notes (Addendum)
Pt has slurred speech, aphasia per son since yesterday. Pt LKN unknown. The neighbor saw pt yesterday and he had slurred speech. NIH 2 for slurred speech and sensation. A&Ox4.

## 2019-12-24 NOTE — Progress Notes (Signed)
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   -Neurology plans to see in consultation.  -continue BP goals as per HTN emergency protocols     Tessie Fass MSN, AGACNP-BC [facility] Pulmonary/Critical Care Medicine [phone] If no answer, [phone] 12/24/2019, 6:34 PM

## 2019-12-24 NOTE — ED Provider Notes (Signed)
[facility] EMERGENCY DEPARTMENT Provider Note   CSN: [id number] Arrival date & time: 12/24/19  1232     History Chief Complaint  Patient presents with  . Aphasia    Brandon Perkins is a 69 y.o. male.  HPI 69 year old male presents with slurred speech. Sent over from urgent care. History is somewhat limited due to the difficulty with speech but it appears that he has been feeling this way for just under 1 week. He denies any focal weakness though when he went to urgent care there was report of maybe some right-sided weakness. He denies headache or new vision changes but has some chronic vision changes from hypertensive retinopathy. He specifically denies hypertension though it is listed in his chart. No chest pain. Blood pressure is very elevated at urgent care.   Past Medical History:  Diagnosis Date  . Blood clot in vein    left arm vein blood clot, not know the detail, treated with ASA per pt  . Cataract    OU  . Hypertension   . Hypertensive retinopathy    OU    Patient Active Problem List   Diagnosis Date Noted  . Hypokalemia 11/07/2014  . Hypertensive urgency 11/07/2014  . Elevated blood pressure 11/06/2014  . Syncope, near 11/06/2014  . GI bleeding 11/06/2014  . Hand laceration 11/06/2014    History reviewed. No pertinent surgical history.     Family History  Problem Relation Age of Onset  . Dementia Mother   . Colon cancer Father   . Stroke Sister        Sister had brain aneurysm  . Diabetes Brother     Social History   Tobacco Use  . Smoking status: Never Smoker  . Smokeless tobacco: Never Used  Substance Use Topics  . Alcohol use: No  . Drug use: No    Home Medications Prior to Admission medications   Medication Sig Start Date End Date Taking? Authorizing Provider  amLODipine (NORVASC) 10 MG tablet Take 1 tablet (10 mg total) by mouth daily. 11/06/18   Pattricia Boss, MD  aspirin EC 81 MG tablet Take 1 tablet (81 mg total) by  mouth daily. 11/08/14   Domenic Polite, MD  cephALEXin (KEFLEX) 500 MG capsule Take 1 capsule (500 mg total) by mouth every 6 (six) hours. For 8days 11/08/14   Domenic Polite, MD  hydrochlorothiazide (HYDRODIURIL) 25 MG tablet Take 1 tablet (25 mg total) by mouth daily. 11/08/14   Domenic Polite, MD  oxyCODONE-acetaminophen (PERCOCET/ROXICET) 5-325 MG per tablet Take 1 tablet by mouth every 4 (four) hours as needed for moderate pain. 11/08/14   Domenic Polite, MD    Allergies    Aspirin  Review of Systems   Review of Systems  Eyes: Negative for visual disturbance.  Cardiovascular: Negative for chest pain.  Neurological: Positive for speech difficulty. Negative for weakness and headaches.  All other systems reviewed and are negative.   Physical Exam Updated Vital Signs BP (!) 254/95   Pulse (!) 52   Temp 97.8 F (36.6 C) (Oral)   Resp 11   Ht 6\' 2"  (1.88 m)   Wt 81.6 kg   SpO2 100%   BMI 23.11 kg/m   Physical Exam Vitals and nursing note reviewed.  Constitutional:      General: He is not in acute distress.    Appearance: He is well-developed. He is not ill-appearing or diaphoretic.  HENT:     Head: Normocephalic and atraumatic.  Right Ear: External ear normal.     Left Ear: External ear normal.     Nose: Nose normal.  Eyes:     General:        Right eye: No discharge.        Left eye: No discharge.     Extraocular Movements: Extraocular movements intact.     Pupils: Pupils are equal, round, and reactive to light.  Cardiovascular:     Rate and Rhythm: Normal rate and regular rhythm.     Heart sounds: Normal heart sounds.  Pulmonary:     Effort: Pulmonary effort is normal.     Breath sounds: Normal breath sounds.  Abdominal:     Palpations: Abdomen is soft.     Tenderness: There is no abdominal tenderness.  Musculoskeletal:     Cervical back: Neck supple.  Skin:    General: Skin is warm and dry.  Neurological:     Mental Status: He is alert and oriented to  person, place, and time.     Comments: CN 3-12 grossly intact. He does have slurred speech. 5/5 strength in all 4 extremities. Grossly normal sensation.   Psychiatric:        Mood and Affect: Mood is not anxious.     ED Results / Procedures / Treatments   Labs (all labs ordered are listed, but only abnormal results are displayed) Labs Reviewed  COMPREHENSIVE METABOLIC PANEL - Abnormal; Notable for the following components:      Result Value   Potassium 5.8 (*)    Glucose, Bld 101 (*)    Calcium 8.8 (*)    Albumin 3.3 (*)    AST 45 (*)    Total Bilirubin 1.6 (*)    All other components within normal limits  URINALYSIS, ROUTINE W REFLEX MICROSCOPIC - Abnormal; Notable for the following components:   Leukocytes,Ua TRACE (*)    Bacteria, UA RARE (*)    All other components within normal limits  CBC WITH DIFFERENTIAL/PLATELET - Abnormal; Notable for the following components:   MCH 25.7 (*)    RDW 15.9 (*)    All other components within normal limits  I-STAT CHEM 8, ED - Abnormal; Notable for the following components:   Potassium 5.7 (*)    Calcium, Ion 0.99 (*)    All other components within normal limits  ETHANOL  RAPID URINE DRUG SCREEN, HOSP PERFORMED  CBC WITH DIFFERENTIAL/PLATELET  CBG MONITORING, ED    EKG EKG Interpretation  Date/Time:  Sunday Dec 24 2019 12:40:42 EDT Ventricular Rate:  53 PR Interval:    QRS Duration: 173 QT Interval:  471 QTC Calculation: 443 R Axis:   -81 Text Interpretation: Sinus rhythm Nonspecific IVCD with LAD Left ventricular hypertrophy Abnormal T, consider ischemia, anterior leads similar to 2016 Confirmed by Pricilla Loveless [phone]) on 12/24/2019 1:11:37 PM   Radiology CT Head Wo Contrast  Result Date: 12/24/2019 CLINICAL DATA:  Focal neuro deficit.  Aphasia.  Stroke suspected. EXAM: CT HEAD WITHOUT CONTRAST TECHNIQUE: Contiguous axial images were obtained from the base of the skull through the vertex without intravenous contrast.  COMPARISON:  None. FINDINGS: Brain: There is no evidence of acute intracranial hemorrhage, mass lesion, brain edema or extra-axial fluid collection. There is atrophy with diffuse prominence of the ventricles and subarachnoid spaces. Cavum septum pellucidum and vergae noted incidentally. Multifocal encephalomalacia is present, most notably in the left occipital lobe and in both cerebellar hemispheres. The right cerebellar component is slightly less distinct and could  be a late subacute infarct. Old lacunar infarcts are present within the right lentiform nuclei and pons. There are chronic small vessel ischemic changes in the periventricular white matter. There is no CT evidence of acute cortical infarction. Vascular: Intracranial vascular calcifications. No hyperdense vessel identified. Skull: Negative for fracture or suspicious focal lesion. Sinuses/Orbits: Left maxillary sinus polyp or mucous retention cysts. The visualized paranasal sinuses, mastoid air cells and middle ears are otherwise clear. No significant orbital findings. Other: None. IMPRESSION: 1. No CT evidence of acute cortical infarction or hemorrhage. 2. Multifocal encephalomalacia as described. Right cerebellar component could reflect a late subacute infarct. 3. Atrophy and chronic small vessel ischemic changes. Electronically Signed   By: Carey Bullocks M.D.   On: 12/24/2019 13:21   DG Chest Portable 1 View  Result Date: 12/24/2019 CLINICAL DATA:  Hypertension.  Slurred speech. EXAM: PORTABLE CHEST 1 VIEW COMPARISON:  None. FINDINGS: Assessment heart size is limited due to portable technique. The heart may be borderline to mildly enlarged. The hila, mediastinum, lungs, and pleura are otherwise unremarkable. IMPRESSION: No acute abnormalities. Electronically Signed   By: Gerome Sam III M.D   On: 12/24/2019 13:29    Procedures .Critical Care Performed by: Pricilla Loveless, MD Authorized by: Pricilla Loveless, MD   Critical care provider  statement:    Critical care time (minutes):  40   Critical care time was exclusive of:  Separately billable procedures and treating other patients   Critical care was necessary to treat or prevent imminent or life-threatening deterioration of the following conditions:  CNS failure or compromise   Critical care was time spent personally by me on the following activities:  Discussions with consultants, evaluation of patient's response to treatment, examination of patient, ordering and performing treatments and interventions, ordering and review of laboratory studies, ordering and review of radiographic studies, pulse oximetry, re-evaluation of patient's condition, obtaining history from patient or surrogate and review of old charts   (including critical care time)  Medications Ordered in ED Medications  clevidipine (CLEVIPREX) infusion 0.5 mg/mL (has no administration in time range)    ED Course  I have reviewed the triage vital signs and the nursing notes.  Pertinent labs & imaging results that were available during my care of the patient were reviewed by me and considered in my medical decision making (see chart for details).    MDM Rules/Calculators/A&P                      Patient is in no acute distress but does have the speech changes in association with very high blood pressure.  CT head does not show any acute emergent findings.  He was started on clevidipine given the bradycardia already presents.  Goal is 25% map reduction.  Discussed with Dr. Wilford Corner, who recommends MRI would also be helpful to make sure there is no stroke or PRES. discussed with ICU given the significant elevated nature of his blood pressure.  Thankfully it has come down somewhat. No chest pain at this time. Final Clinical Impression(s) / ED Diagnoses Final diagnoses:  Hypertensive emergency    Rx / DC Orders ED Discharge Orders    None       Pricilla Loveless, MD 12/24/19 [phone]

## 2019-12-24 NOTE — ED Notes (Signed)
Pt c/o of nausea, no emesis at this time, and diaphoretic. MD paged.

## 2019-12-24 NOTE — ED Notes (Signed)
pts daughter would like an update. [phone]

## 2019-12-24 NOTE — Progress Notes (Signed)
eLink Physician-Brief Progress Note Patient Name: HEATHER STREEPER DOB: [birth year] MRN: [id number]   Date of Service  12/24/2019  HPI/Events of Note  69 year old man with HTN, now with hypertensive emergency, stroke. Seen by PCCM. No acute complaints, has some nausea and just got zofran. BP improved on Cleviprex  eICU Interventions  Magnesium repleted BP control per neuro goals Please call E link if needed     Intervention Category Major Interventions: Electrolyte abnormality - evaluation and management;Hypertension - evaluation and management Evaluation Type: New Patient Evaluation  Oretha Milch 12/24/2019, 10:16 PM

## 2019-12-24 NOTE — ED Notes (Signed)
[name], son, [phone] would like an update when available

## 2019-12-24 NOTE — Consult Note (Addendum)
Referring Physician: Dr. Criss Alvine    Chief Complaint: Slurred speech  HPI: Brandon Perkins is an 69 y.o. male with a PMHx of HTN, hypertensive retinopathy, "left arm vein blood clot" and GI bleed who was sent to the ED today from Urgent Care for evaluation of suspected stroke. He has had a several day history of progressively worsening slurred speech, weakness, gait unsteadiness, dizziness and confusion, with SBP of > 200 in UC. In the ED, his SBP was > 240. He was started on Cleviprex gtt.   CT head revealed no evidence of acute cortical infarction or hemorrhage. Multifocal encephalomalacia was noted, most notably in the left occipital lobe and in both cerebellar hemispheres; the right cerebellar component was slightly less distinct and was thought by Radiology to possibly be a late subacute infarct. Old lacunar infarcts were noted within the right lentiform nuclei and pons. Chronic small vessel ischemic changes in the periventricular white matter were also appreciated.   CTA of head: Multifocal intracranial stenoses, appearing most consistent with severe intracranial atherosclerotic disease, as follows: 1. 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 2.Negative for any complete occlusion; right SCA remains at least partially patent. 3. Also widespread irregularity and stenoses of the second order intracranial branches, including moderate stenosis at the Right MCA bifurcation.  CTA neck: Minimal/mild atherosclerosis in the neck. There is no significant carotid or vertebral artery stenosis.  MRI brain was then obtained, revealing an acute stroke in the right superior cerebellar artery territory:  MRI brain: 1. 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 No associated hemorrhage or mass effect. 2. 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 Also denied headache or new vision changes on a baseline of chronic visual impairment from hypertensive retinopathy. Also with no chest pain.   He has no known prior history of stroke, despite the old strokes seen on CT and MRI.    Past Medical History:  Diagnosis Date  . Blood clot in vein    left arm vein blood clot, not know the detail, treated with ASA per pt  . Cataract    OU  . Hypertension   . Hypertensive retinopathy    OU    History reviewed. No pertinent surgical history.  Family History  Problem Relation Age of Onset  . Dementia Mother   . Colon cancer Father   . Stroke Sister        Sister had brain aneurysm  . Diabetes Brother    Social History:  reports that he has never smoked. He has never used smokeless tobacco. He reports that he does not drink alcohol or use drugs.  Allergies:  Allergies  Allergen Reactions  . Aspirin     When takes a lot of aspirin-- reaction unknown    Medications:   Home medications: Norvasc (patient not taking) ASA 81 mg po qd (patient not taking) HCTZ (patient not taking) Percocet (patient not taking)  Inpatient Scheduled: .  stroke: mapping our early stages of recovery book   Does not apply Once  . amLODipine  10 mg Oral Daily  . enoxaparin (LOVENOX) injection  40 mg Subcutaneous Q24H  . ondansetron (ZOFRAN) IV  4 mg Intravenous Once   Inpatient Continuous: . clevidipine  21 mg/hr (12/24/19 2043)    ROS: As per HPI. Does not endorse additional symptoms.   Physical Examination: Blood pressure (!) 144/69, pulse 68, temperature 97.8 F (36.6 C), temperature source Oral, resp. rate 20, height  (1.88 m), weight 81.6 kg, SpO2 95 %.  HEENT: [place]/AT Lungs: Respirations unlabored Ext: No edema  Neurologic Examination: Mental Status: Awake  and alert. Minimal speech output consists of 1-2 word, slowly uttered, hypophonic, dysarthric, monotone replies to questions, but is fully oriented in this context. Able to follow all motor commands. Able to name a thumb and index finger, as well as to correctly name the president.  Cranial Nerves: II:  Visual fields intact with no extinction to DSS. PERRL.  III,IV, VI: No ptosis. EOM are slow but full. No nystagmus.   V,VII: Harold Barban is symmetric. Facial temp sensation equal bilaterally VIII: Hearing intact to voice IX,X: Hypophonic speech XI: No gross deficit noted  XII: Midline tongue extension  Motor: Moves bilateral upper extremities slowly. No pronator drift. Right deltoid and triceps 4/5, otherwise BUE are 5/5 proximally and distally.  Moves BLE, lifting antigravity at the hip with 4/5 strength bilaterally, with 5/5 knee extension, ADF/APF bilaterally. Knee flexion is asymmetric, with 4/5 strength to RLE and 5/5 strength to LLE.  Sensory: Temp sensation decreased BUE but intact BLE. FT intact x 4 without extinction.   Deep Tendon Reflexes:  1+ bilateral brachioradialis in the context of patient not fully relaxing upper extremities. 3+ bilateral patellae.  0 bilateral achilles.  Toes downgoing bilaterally  Cerebellar: No ataxia with FNF bilatearally. Bradykinesia noted.  Gait: Deferred  Results for orders placed or performed during the hospital encounter of 12/24/19 (from the past 48 hour(s))  CBG monitoring, ED     Status: None   Collection Time: 12/24/19  1:38 PM  Result Value Ref Range   Glucose-Capillary 88 70 - 99 mg/dL    Comment: Glucose reference range applies only to samples taken after fasting for at least 8 hours.  Comprehensive metabolic panel     Status: Abnormal   Collection Time: 12/24/19  1:39 PM  Result Value Ref Range   Sodium 139 135 - 145 mmol/L   Potassium 5.8 (H) 3.5 - 5.1 mmol/L    Comment: SPECIMEN HEMOLYZED. HEMOLYSIS MAY AFFECT INTEGRITY OF RESULTS.    Chloride 104 98 - 111 mmol/L   CO2 26 22 - 32 mmol/L   Glucose, Bld 101 (H) 70 - 99 mg/dL    Comment: Glucose reference range applies only to samples taken after fasting for at least 8 hours.   BUN 11 8 - 23 mg/dL   Creatinine, Ser 1.61 0.61 - 1.24 mg/dL   Calcium 8.8 (L) 8.9 - 10.3 mg/dL   Total Protein 6.6 6.5 - 8.1 g/dL   Albumin 3.3 (L) 3.5 - 5.0 g/dL   AST 45 (H) 15 - 41 U/L   ALT 15 0 - 44 U/L   Alkaline Phosphatase 49 38 - 126 U/L   Total Bilirubin 1.6 (H) 0.3 - 1.2 mg/dL   GFR calc non Af Amer >60 >60 mL/min   GFR calc Af Amer >60 >60 mL/min   Anion gap 9 5 - 15    Comment: Performed at [facility], [address], [place], [place] [address]  Ethanol     Status: None   Collection Time: 12/24/19  1:39 PM  Result Value Ref Range   Alcohol, Ethyl (B) <10 <10 mg/dL    Comment: (NOTE)  Lowest detectable limit for serum alcohol is 10 mg/dL. For medical purposes only. Performed at [facility], [address] [place], [place] [address]   Urine rapid drug screen (hosp performed)     Status: None   Collection Time: 12/24/19  1:39 PM  Result Value Ref Range   Opiates NONE DETECTED NONE DETECTED   Cocaine NONE DETECTED NONE DETECTED   Benzodiazepines NONE DETECTED NONE DETECTED   Amphetamines NONE DETECTED NONE DETECTED   Tetrahydrocannabinol NONE DETECTED NONE DETECTED   Barbiturates NONE DETECTED NONE DETECTED    Comment: (NOTE) DRUG SCREEN FOR MEDICAL PURPOSES ONLY.  IF CONFIRMATION IS NEEDED FOR ANY PURPOSE, NOTIFY LAB WITHIN 5 DAYS. LOWEST DETECTABLE LIMITS FOR URINE DRUG SCREEN Drug Class                     Cutoff (ng/mL) Amphetamine and metabolites    1000 Barbiturate and metabolites    200 Benzodiazepine                 200 Tricyclics and metabolites     300 Opiates and metabolites        300 Cocaine and metabolites        300 THC                            50 Performed at [facility], [address], [place], [place] [address]    Urinalysis, Routine w reflex microscopic     Status: Abnormal   Collection Time: 12/24/19  1:39 PM  Result Value Ref Range   Color, Urine YELLOW YELLOW   APPearance CLEAR CLEAR   Specific Gravity, Urine 1.018 1.005 - 1.030   pH 7.0 5.0 - 8.0   Glucose, UA NEGATIVE NEGATIVE mg/dL   Hgb urine dipstick NEGATIVE NEGATIVE   Bilirubin Urine NEGATIVE NEGATIVE   Ketones, ur NEGATIVE NEGATIVE mg/dL   Protein, ur NEGATIVE NEGATIVE mg/dL   Nitrite NEGATIVE NEGATIVE   Leukocytes,Ua TRACE (A) NEGATIVE   WBC, UA 11-20 0 - 5 WBC/hpf   Bacteria, UA RARE (A) NONE SEEN   Mucus PRESENT     Comment: Performed at [facility], [address] [place], [place] [address]  I-stat chem 8, ED (not at [facility] or [facility])     Status: Abnormal   Collection Time: 12/24/19  1:49 PM  Result Value Ref Range   Sodium 136 135 - 145 mmol/L   Potassium 5.7 (H) 3.5 - 5.1 mmol/L   Chloride 106 98 - 111 mmol/L   BUN 15 8 - 23 mg/dL   Creatinine, Ser 3.87 0.61 - 1.24 mg/dL   Glucose, Bld 94 70 - 99 mg/dL    Comment: Glucose reference range applies only to samples taken after fasting for at least 8 hours.   Calcium, Ion 0.99 (L) 1.15 - 1.40 mmol/L   TCO2 27 22 - 32 mmol/L   Hemoglobin 13.9 13.0 - 17.0 g/dL   HCT 56.4 33.2 - 95.1 %  CBC with Differential     Status: Abnormal   Collection Time: 12/24/19  2:17 PM  Result Value Ref Range   WBC 5.0 4.0 - 10.5 K/uL   RBC 5.05 4.22 - 5.81 MIL/uL   Hemoglobin 13.0 13.0 - 17.0 g/dL   HCT 88.4 16.6 - 06.3 %   MCV 84.4 80.0 - 100.0 fL   MCH 25.7 (L) 26.0 - 34.0 pg   MCHC  30.5 30.0 - 36.0 g/dL   RDW 21.315.9 (H) 08.611.5 - 57.815.5 %   Platelets 179 150 - 400 K/uL   nRBC 0.0 0.0 - 0.2 %   Neutrophils Relative % 53 %   Neutro Abs 2.7 1.7 - 7.7 K/uL   Lymphocytes Relative 36 %   Lymphs Abs 1.8 0.7 - 4.0 K/uL   Monocytes Relative 9 %   Monocytes Absolute 0.5 0.1 - 1.0 K/uL   Eosinophils Relative 1 %   Eosinophils Absolute 0.1 0.0 - 0.5 K/uL   Basophils Relative 1 %   Basophils  Absolute 0.0 0.0 - 0.1 K/uL   Immature Granulocytes 0 %   Abs Immature Granulocytes 0.01 0.00 - 0.07 K/uL    Comment: Performed at [facility], [address] [place], [place] [address]  Magnesium     Status: None   Collection Time: 12/24/19  6:49 PM  Result Value Ref Range   Magnesium 1.7 1.7 - 2.4 mg/dL    Comment: Performed at [facility], [address] [place], [place] [address]  SARS Coronavirus 2 by RT PCR (hospital order, performed in [facility] lab) Nasopharyngeal Nasopharyngeal Swab     Status: None   Collection Time: 12/24/19  6:49 PM   Specimen: Nasopharyngeal Swab  Result Value Ref Range   SARS Coronavirus 2 NEGATIVE NEGATIVE    Comment: (NOTE) SARS-CoV-2 target nucleic acids are NOT DETECTED. The SARS-CoV-2 RNA is generally detectable in upper and lower respiratory specimens during the acute phase of infection. The lowest concentration of SARS-CoV-2 viral copies this assay can detect is 250 copies / mL. A negative result does not preclude SARS-CoV-2 infection and should not be used as the sole basis for treatment or other patient management decisions.  A negative result may occur with improper specimen collection / handling, submission of specimen other than nasopharyngeal swab, presence of viral mutation(s) within the areas targeted by this assay, and inadequate number of viral copies (<250 copies / mL). A negative result must be combined with clinical observations, patient history, and epidemiological information. Fact Sheet for Patients:   BoilerBrush.com.cyhttps://www.fda.gov/media/136312/download Fact Sheet for Healthcare Providers: https://pope.com/https://www.fda.gov/media/136313/download This test is not yet approved or cleared  by the [place] FDA and has been authorized for detection and/or diagnosis of SARS-CoV-2 by FDA under an Emergency Use Authorization (EUA).  This EUA will remain in effect (meaning this test can be used) for the duration of the COVID-19  declaration under Section 564(b)(1) of the Act, 21 U.S.C. section 360bbb-3(b)(1), unless the authorization is terminated or revoked sooner. Performed at [facility], [address] [place], [place] [address]   Basic metabolic panel     Status: Abnormal   Collection Time: 12/24/19  6:49 PM  Result Value Ref Range   Sodium 139 135 - 145 mmol/L   Potassium 3.9 3.5 - 5.1 mmol/L   Chloride 103 98 - 111 mmol/L   CO2 25 22 - 32 mmol/L   Glucose, Bld 124 (H) 70 - 99 mg/dL    Comment: Glucose reference range applies only to samples taken after fasting for at least 8 hours.   BUN 9 8 - 23 mg/dL   Creatinine, Ser 0.100.90 0.61 - 1.24 mg/dL   Calcium 9.3 8.9 - 27.210.3 mg/dL   GFR calc non Af Amer >60 >60 mL/min   GFR calc Af Amer >60 >60 mL/min   Anion gap 11 5 - 15    Comment: Performed at [facility], [place] [address], [place], [place] [address]   CT ANGIO HEAD W OR WO CONTRAST  Result Date: 12/24/2019 CLINICAL DATA:  69 year old male with 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 EXAM: CT ANGIOGRAPHY HEAD AND NECK TECHNIQUE: Multidetector CT imaging of the head and neck was performed using the standard protocol during bolus administration of intravenous contrast. Multiplanar CT image reconstructions and MIPs were obtained to evaluate the vascular anatomy. Carotid stenosis measurements (when applicable) are obtained utilizing NASCET criteria, using the distal internal carotid diameter as the denominator. CONTRAST:  68mL OMNIPAQUE IOHEXOL 350 MG/ML SOLN COMPARISON:  Head CT and MRI earlier today. FINDINGS: CTA NECK Skeleton: Advanced cervical spine disc and endplate degeneration with reversal of lordosis. Carious posterior right maxillary dentition. No acute osseous abnormality identified. Upper chest: Negative. Other neck: No acute findings. Aortic arch: 3 vessel arch configuration with mild for age arch atherosclerosis. Right carotid system:  Tortuous brachiocephalic artery without plaque or stenosis. Normal proximal right CCA. There is low-density soft plaque within the medial right CCA at the level of the thyroid cartilage on series 5, image 126, but no significant stenosis. Capacious right carotid bifurcation with minimal calcified plaque of right bulb. Tortuous right ICA just below the skull base without stenosis. Left carotid system: Subtle soft plaque in the left CCA which is mildly tortuous. Mild calcified plaque in the posterior left ICA bulb without stenosis. Similar left ICA tortuosity at C1-C2. Vertebral arteries: Mildly tortuous proximal right subclavian artery without plaque or stenosis. Normal right vertebral artery origin. The right vertebral is patent to the skull base without plaque or stenosis. No proximal left subclavian artery plaque or stenosis. Normal left vertebral artery origin. Codominant left vertebral is patent to the skull base without plaque or stenosis. CTA HEAD Posterior circulation: Codominant V4 segments with minimal calcified plaque. No stenosis on the left. There is mild right V4 segment stenosis (series 7, image 145) which may be related to soft plaque. Both PICA origins remain patent. Patent vertebrobasilar junction. Patent and normal proximal and mid basilar artery. However, the distal basilar tapers, with patent but irregular basilar tip, highly irregular bilateral SCA and PCA origins (series 11, image 22). There is at least partial patency of the right SCA visible on series 8, image 102, but all 4 basilar artery branches in this region are highly irregular. The P2 and P3 segments appear more normal, although remain moderately irregular. No PCA branch occlusion is identified. Anterior circulation: Both ICA siphons are patent with mild ectasia, minimal calcified plaque and no stenosis. Normal ophthalmic artery origins. Posterior communicating arteries are diminutive or absent. Patent carotid termini but severe  irregularity and stenosis at both ACA origins rim anise int of that at the basilar tip (series 11, image 19). Both MCA origins are more normal. Bilateral ACAs are patent but diminutive and irregular. There is moderate to severe irregularity and stenosis of mid and distal A2 segments (series 12, image 20). Mildly irregular left MCA M1 without stenosis. Patent left MCA bifurcation. Mildly irregular left MCA branches. Tapered and irregular right MCA M1 which becomes moderately irregular and stenotic at the right MCA bifurcation (series 11, image 17). However, the major right MCA branches remain patent although moderately irregular throughout (series 12, image 13). Venous sinuses: Early contrast timing, not well evaluated. Anatomic variants: None. Review of the MIP images confirms the above findings IMPRESSION: 1. Positive for moderate and severe irregularity and stenosis at  the Basilar Artery tip, the bilateral PCA and SCA origins, and also both ACA origins. 2. But negative for any complete occlusion; and the Right SCA remains at least partially patent. 3. Also widespread irregularity and stenoses of the second order intracranial branches, including moderate stenosis at the Right MCA bifurcation. 4. The above is favored due to advanced intracranial atherosclerosis, despite only minimal/mild atherosclerosis in the neck. There is no significant carotid or vertebral artery stenosis. Salient findings were communicated to Dr. Otelia Limes at 8:16 pm on 12/24/2019 by text page via the [facility] messaging system. Electronically Signed   By: Odessa Fleming M.D.   On: 12/24/2019 20:16   CT Head Wo Contrast  Result Date: 12/24/2019 CLINICAL DATA:  Focal neuro deficit.  Aphasia.  Stroke suspected. EXAM: CT HEAD WITHOUT CONTRAST TECHNIQUE: Contiguous axial images were obtained from the base of the skull through the vertex without intravenous contrast. COMPARISON:  None. FINDINGS: Brain: There is no evidence of acute intracranial hemorrhage,  mass lesion, brain edema or extra-axial fluid collection. There is atrophy with diffuse prominence of the ventricles and subarachnoid spaces. Cavum septum pellucidum and vergae noted incidentally. Multifocal encephalomalacia is present, most notably in the left occipital lobe and in both cerebellar hemispheres. The right cerebellar component is slightly less distinct and could be a late subacute infarct. Old lacunar infarcts are present within the right lentiform nuclei and pons. There are chronic small vessel ischemic changes in the periventricular white matter. There is no CT evidence of acute cortical infarction. Vascular: Intracranial vascular calcifications. No hyperdense vessel identified. Skull: Negative for fracture or suspicious focal lesion. Sinuses/Orbits: Left maxillary sinus polyp or mucous retention cysts. The visualized paranasal sinuses, mastoid air cells and middle ears are otherwise clear. No significant orbital findings. Other: None. IMPRESSION: 1. No CT evidence of acute cortical infarction or hemorrhage. 2. Multifocal encephalomalacia as described. Right cerebellar component could reflect a late subacute infarct. 3. Atrophy and chronic small vessel ischemic changes. Electronically Signed   By: Carey Bullocks M.D.   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 EXAM: CT ANGIOGRAPHY HEAD AND NECK TECHNIQUE: Multidetector CT imaging of the head and neck was performed using the standard protocol during bolus administration of intravenous contrast. Multiplanar CT image reconstructions and MIPs were obtained to evaluate the vascular anatomy. Carotid stenosis measurements (when applicable) are obtained utilizing NASCET criteria, using the distal internal carotid diameter as the denominator. CONTRAST:  13mL OMNIPAQUE IOHEXOL 350 MG/ML  SOLN COMPARISON:  Head CT and MRI earlier today. FINDINGS: CTA NECK Skeleton: Advanced cervical spine disc and endplate degeneration with reversal of lordosis. Carious posterior right maxillary dentition. No acute osseous abnormality identified. Upper chest: Negative. Other neck: No acute findings. Aortic arch: 3 vessel arch configuration with mild for age arch atherosclerosis. Right carotid system: Tortuous brachiocephalic artery without plaque or stenosis. Normal proximal right CCA. There is low-density soft plaque within the medial right CCA at the level of the thyroid cartilage on series 5, image 126, but no significant stenosis. Capacious right carotid bifurcation with minimal calcified plaque of right bulb. Tortuous right ICA just below the skull base without stenosis. Left carotid system: Subtle soft plaque in the left CCA which is mildly tortuous. Mild calcified plaque in the posterior left ICA bulb without stenosis. Similar left ICA tortuosity at C1-C2. Vertebral arteries:  Mildly tortuous proximal right subclavian artery without plaque or stenosis. Normal right vertebral artery origin. The right vertebral is patent to the skull base without plaque or stenosis. No proximal left subclavian artery plaque or stenosis. Normal left vertebral artery origin. Codominant left vertebral is patent to the skull base without plaque or stenosis. CTA HEAD Posterior circulation: Codominant V4 segments with minimal calcified plaque. No stenosis on the left. There is mild right V4 segment stenosis (series 7, image 145) which may be related to soft plaque. Both PICA origins remain patent. Patent vertebrobasilar junction. Patent and normal proximal and mid basilar artery. However, the distal basilar tapers, with patent but irregular basilar tip, highly irregular bilateral SCA and PCA origins (series 11, image 22). There is at least partial patency of the right SCA visible on series 8, image 102, but all 4 basilar artery  branches in this region are highly irregular. The P2 and P3 segments appear more normal, although remain moderately irregular. No PCA branch occlusion is identified. Anterior circulation: Both ICA siphons are patent with mild ectasia, minimal calcified plaque and no stenosis. Normal ophthalmic artery origins. Posterior communicating arteries are diminutive or absent. Patent carotid termini but severe irregularity and stenosis at both ACA origins rim anise int of that at the basilar tip (series 11, image 19). Both MCA origins are more normal. Bilateral ACAs are patent but diminutive and irregular. There is moderate to severe irregularity and stenosis of mid and distal A2 segments (series 12, image 20). Mildly irregular left MCA M1 without stenosis. Patent left MCA bifurcation. Mildly irregular left MCA branches. Tapered and irregular right MCA M1 which becomes moderately irregular and stenotic at the right MCA bifurcation (series 11, image 17). However, the major right MCA branches remain patent although moderately irregular throughout (series 12, image 13). Venous sinuses: Early contrast timing, not well evaluated. Anatomic variants: None. Review of the MIP images confirms the above findings IMPRESSION: 1. 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 2. But negative for any complete occlusion; and the Right SCA remains at least partially patent. 3. Also widespread irregularity and stenoses of the second order intracranial branches, including moderate stenosis at the Right MCA bifurcation. 4. The above is favored due to advanced intracranial atherosclerosis, despite only minimal/mild atherosclerosis in the neck. There is no significant carotid or vertebral artery stenosis. Salient findings were communicated to Dr. Otelia Limes at 8:16 pm on 12/24/2019 by text page via the [facility] messaging system. Electronically Signed    By: Odessa Fleming M.D.   On: 12/24/2019 20:16   MR BRAIN WO CONTRAST  Result Date: 12/24/2019 CLINICAL DATA:  69 year old male with aphasia, neurologic deficit. EXAM: MRI HEAD WITHOUT CONTRAST TECHNIQUE: Multiplanar, multiecho pulse sequences of the brain and surrounding structures were obtained without intravenous contrast. COMPARISON:  Head CT 1255 hours today. FINDINGS: Brain: Patchy and confluent restricted diffusion in the right superior cerebellar artery territory (series 11, image 50). Associated T2 and FLAIR hyperintensity, T1 hypointensity, no evidence of acute hemorrhage. No posterior fossa mass effect. Superimposed chronic infarcts in the contralateral left cerebellum, bilateral pons, bilateral deep gray nuclei, bilateral corona radiata. Chronic cortical encephalomalacia in the left parietal and occipital lobes. Occasional chronic micro hemorrhages including in the left brainstem. No midline shift, mass effect, evidence of mass lesion, ventriculomegaly, extra-axial collection or acute intracranial hemorrhage. Cervicomedullary junction and pituitary are within normal limits. Cavum septum pellucidum (normal variant).  Vascular: Major intracranial vascular flow voids are preserved. Generalized intracranial artery tortuosity. Skull and upper cervical spine: Partially visible cervical spine degeneration with evidence of mild degenerative spinal stenosis at C3-C4 (series 13, image 13). Visualized bone marrow signal is within normal limits. Sinuses/Orbits: Negative orbits. Mild maxillary sinus mucous retention cysts. Other: Mastoids are clear. Visible internal auditory structures appear normal. Scalp and face soft tissues appear negative. IMPRESSION: 1. 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 No associated hemorrhage or mass effect. 2. Underlying advanced chronic ischemic disease. 3. Partially visible cervical spine degeneration with  mild spinal stenosis at C3-C4. Electronically Signed   By: Odessa Fleming M.D.   On: 12/24/2019 16:58   DG Chest Portable 1 View  Result Date: 12/24/2019 CLINICAL DATA:  Hypertension.  Slurred speech. EXAM: PORTABLE CHEST 1 VIEW COMPARISON:  None. FINDINGS: Assessment heart size is limited due to portable technique. The heart may be borderline to mildly enlarged. The hila, mediastinum, lungs, and pleura are otherwise unremarkable. IMPRESSION: No acute abnormalities. Electronically Signed   By: Gerome Sam III M.D   On: 12/24/2019 13:29    Assessment: 69 y.o. male presenting with acute right cerebellar stroke and hypertensive emergency. 1. Exam reveals sparse speech with dysarthria. Mild asymmetry on motor exam with RUE and RLE being weaker than left.  2. 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 No associated hemorrhage or mass effect.Underlying advanced chronic ischemic disease with old strokes.Old hypertensive microbleeds are also seen.  3. CTA of head: Multifocal intracranial stenoses, appearing most consistent with severe intracranial atherosclerotic disease.  4. CTA neck: Minimal/mild atherosclerosis in the neck. There is no significant carotid or vertebral artery stenosis. 5. Stroke Risk Factors - HTN, history of "left arm vein blood clot", evidence for prior strokes on imaging.   6. Hypocalcemia and hyperkalemia.   Recommendations: 1. Agree with monitoring and BP management in the ICU. Plan per CCM is to titrate clevidipine to a SBP of 160-180. From a neurological perspective, would continue with this goal for 24 hours, then reduce by 15% per day to a final goal of 120-140.  2. HgbA1c, fasting lipid panel 3. PT consult, OT consult, Speech consult 4. Echocardiogram 5. Telemetry monitoring 6. Frequent neuro checks 7. Start Plavix for secondary stroke prevention.  8. He has an ASA allergy, but may in  the long term need DAPT due to severe intracranial atherosclerosis. After his BP stabilizes, consider adding ASA to Plavix if the past symptoms of his ASA allergy were mild.   9. Start atorvastatin. Obtain baseline CK level. Will need periodic monitoring of LFTs as AST slightly elevated at 45.     signed: Dr. Caryl Pina  12/24/2019, 9:13 PM

## 2019-12-24 NOTE — ED Notes (Signed)
Pt moved from green 8 to rm 22 due to having cleciprex titrated pt alert and oriented c/o his lt foot being cold  Bl;anket given

## 2019-12-24 NOTE — ED Notes (Signed)
I called 911 to transport pt to hosp

## 2019-12-24 NOTE — ED Triage Notes (Signed)
Pt BIB [facility] from [facility], c/o slurred speech since Monday. Pt r sided weakness, ankle chronically weak from previous fx. Pt  Hypertensive at 240/104, noncompliant w/ BP meds.

## 2019-12-24 NOTE — ED Provider Notes (Addendum)
[facility]    CSN: [id number] Arrival date & time: 12/24/19  1147      History   Chief Complaint Chief Complaint  Patient presents with  . Cerebrovascular Accident    HPI Brandon Perkins is a 69 y.o. male.   Patient with history of hypertension is brought to the urgent care by family friend for slurred speech and weakness.  Supported by the friend accompanying him that yesterday 12/23/2019 at 1000 hrs. in the morning noted patient had slurred speech.  He also noted some weakness on the right side.  Speech issues and weakness of persisted since that time.  Friend reports that patient seemed to be altered yesterday thinking it was March and was seemingly confused.  It is reported the patient has been able to walk.  Patient was not complaining of chest pain or shortness of breath during this.  No other major symptoms reported.  No reported chest pain or shortness of breath.  No severe headaches.  Patient reports he was feeling okay until yesterday.   This provider is full in the triage room for evaluation of possible stroke.     Past Medical History:  Diagnosis Date  . Blood clot in vein    left arm vein blood clot, not know the detail, treated with ASA per pt  . Cataract    OU  . Hypertension   . Hypertensive retinopathy    OU    Patient Active Problem List   Diagnosis Date Noted  . Hypokalemia 11/07/2014  . Hypertensive urgency 11/07/2014  . Elevated blood pressure 11/06/2014  . Syncope, near 11/06/2014  . GI bleeding 11/06/2014  . Hand laceration 11/06/2014    No past surgical history on file.     Home Medications    Prior to Admission medications   Medication Sig Start Date End Date Taking? Authorizing Provider  amLODipine (NORVASC) 10 MG tablet Take 1 tablet (10 mg total) by mouth daily. 11/06/18   Margarita Grizzle, MD  aspirin EC 81 MG tablet Take 1 tablet (81 mg total) by mouth daily. 11/08/14   Zannie Cove, MD  cephALEXin (KEFLEX) 500 MG  capsule Take 1 capsule (500 mg total) by mouth every 6 (six) hours. For 8days 11/08/14   Zannie Cove, MD  hydrochlorothiazide (HYDRODIURIL) 25 MG tablet Take 1 tablet (25 mg total) by mouth daily. 11/08/14   Zannie Cove, MD  oxyCODONE-acetaminophen (PERCOCET/ROXICET) 5-325 MG per tablet Take 1 tablet by mouth every 4 (four) hours as needed for moderate pain. 11/08/14   Zannie Cove, MD    Family History Family History  Problem Relation Age of Onset  . Dementia Mother   . Colon cancer Father   . Stroke Sister        Sister had brain aneurysm  . Diabetes Brother     Social History Social History   Tobacco Use  . Smoking status: Never Smoker  . Smokeless tobacco: Never Used  Substance Use Topics  . Alcohol use: No  . Drug use: No     Allergies   Aspirin   Review of Systems Review of Systems   Physical Exam Triage Vital Signs ED Triage Vitals  Enc Vitals Group     BP 12/24/19 1152 (!) 247/89     Pulse Rate 12/24/19 1152 63     Resp 12/24/19 1152 18     Temp 12/24/19 1152 98.8 F (37.1 C)     Temp Source 12/24/19 1152 Oral  SpO2 12/24/19 1152 100 %     Weight --      Height --      Head Circumference --      Peak Flow --      Pain Score 12/24/19 1204 0     Pain Loc --      Pain Edu? --      Excl. in GC? --    No data found.  Updated Vital Signs BP (!) 242/92 (BP Location: Left Arm)   Pulse 63   Temp 98.8 F (37.1 C) (Oral)   Resp 18   SpO2 100%   Visual Acuity Right Eye Distance:   Left Eye Distance:   Bilateral Distance:    Right Eye Near:   Left Eye Near:    Bilateral Near:     Physical Exam Vitals and nursing note reviewed.  Constitutional:      General: He is in acute distress.     Appearance: He is not diaphoretic.  HENT:     Head: Normocephalic and atraumatic.     Mouth/Throat:     Mouth: Mucous membranes are moist.     Pharynx: Oropharynx is clear.  Eyes:     Pupils: Pupils are equal, round, and reactive to light.   Cardiovascular:     Rate and Rhythm: Normal rate and regular rhythm.     Comments: Pulses are equal bilaterally in the upper and lower extremities. Pulmonary:     Effort: Pulmonary effort is normal. No respiratory distress.     Breath sounds: Normal breath sounds. No wheezing or rales.  Musculoskeletal:     Right lower leg: No edema.     Left lower leg: No edema.  Skin:    General: Skin is warm and dry.     Capillary Refill: Capillary refill takes less than 2 seconds.  Neurological:     Mental Status: He is alert and oriented to person, place, and time.     Comments: Patient is alert and oriented to person place and time.  He is interactive and answering questions appropriately.  There is slurring his speech. Smile equal, forehead wrinkle equal.   Extraocular movements intact to confrontation.  Pupils equal and reactive No arm drift.  Weakness is greater on the left versus right. No tongue deviation. Sensation grossly intact.      UC Treatments / Results  Labs (all labs ordered are listed, but only abnormal results are displayed) Labs Reviewed - No data to display  EKG EKG with sinus rhythm.  There are T wave abnormalities.  Evidence of right bundle blanch block.  No ST elevation.  Abnormal EKG with some findings consistent with previous.  Radiology No results found.  Procedures Procedures (including critical care time)  Medications Ordered in UC Medications - No data to display  Initial Impression / Assessment and Plan / UC Course  I have reviewed the triage vital signs and the nursing notes.  Pertinent labs & imaging results that were available during my care of the patient were reviewed by me and considered in my medical decision making (see chart for details).    #Hypertensive emergency #Slurred speech #Weakness Patient 69 year old with history of hypertension presenting with concern for stroke.  NIH 2.  Is been greater than 24 hours since onset of symptoms  and is unclear if when true last known well was.  Patient had extremely elevated blood pressure 242/92 in clinic.  EKG without sign of STEMI currently.  EMS called and  charge nurse in the ED notified of possible stroke.  Given his outside of 24-hour window will hold on code stroke called.  Patient be transported by EMS to [facility] emergency department for further evaluation. Final Clinical Impressions(s) / UC Diagnoses   Final diagnoses:  Speech disturbance, unspecified type  Weakness   Discharge Instructions   None    ED Prescriptions    None     PDMP not reviewed this encounter.   Purnell Shoemaker, PA-C 12/24/19 1219    Madigan Rosensteel, Marguerita Beards, PA-C 12/24/19 1220

## 2019-12-24 NOTE — H&P (Signed)
NAME:  Brandon Perkins, MRN:  [id number], DOB:  26-Jul-1951, LOS: 0 ADMISSION DATE:  12/24/2019, CONSULTATION DATE:  12/24/19 REFERRING MD:  Criss Alvine - EM, CHIEF COMPLAINT:  Slurred speech  Brief History   69 yo M with poor medical compliance presented ot UC after sx slurred speech, weakness, confusion. At UC, SBP > 200, referred to ED   History of present illness   69 yo M PMH HTN, HTn urgency, GIB, HTN retinopathy who was referred to ED via UC on 12/24/19 for stroke like symptoms including slurred speech, weakness, confusion, with SBP noted to be >200. Patient is difficult historian due to degree of slurred speech and some confusion, although he is able to communicate that he has been feeling week, and noticing slurred speech progressively for multiple days. He also reveals unsteadiness on his feet, dizziness. Denies new vision changes.  SBP in Ed > 240 and pt sent to CT H, started on cleviprex gtt.   PCCM consulted for admit   Past Medical History  HTN, poorly controlled, HTN urgency  HTN retinopathy GIB Near syncope  Significant Hospital Events   5/23 referred to ED from UC with CVA-like sx and HTN emergency  Consults:  PCCM  Procedures:    Significant Diagnostic Tests:  5/23 CT H > chronic small vessel ischemic changes. Multifocal encephalomalacia. No acute infarct or hemorrhage seen.  5/23 MRI Brain >> Micro Data:  5/23 SARS Cov2>>   Antimicrobials:    Interim history/subjective:  SBP 200 on cleviprex at 6 Tearful over overwhelming nature of medical condition.   Objective   Blood pressure (!) 184/85, pulse (!) 56, temperature 97.8 F (36.6 C), temperature source Oral, resp. rate 14, height 6\' 2"  (1.88 m), weight 81.6 kg, SpO2 97 %.       No intake or output data in the 24 hours ending 12/24/19 1443 Filed Weights   12/24/19 1244  Weight: 81.6 kg    Examination: General: WDWN adult M, reclined in ED stretcher NAD HENT: NCAt. Injected sclera. Trachea midline. Poor  dentition Lungs: CTAB. Symmetrical chest expansion. No accessory muscle use on RA Cardiovascular: RRR s1s2 no rgm cap refill < 3. Bounding 2+ peripheral pulses Abdomen: soft flat ndnt normoactive  Extremities: Symmetrical bulk and tone. No obvious joint deformity. No cyanosis or clubbing  Neuro: Awake, alert, oriented x3. Slurred speech. Impaired proprioiception.  GU: defer  Resolved Hospital Problem list     Assessment & Plan:   Slurred Speech, weakness -onset 12/23/19 1000 -referred from UC to ED, EM has discussed with neuro who reviewed unremarkable CT H  -likely sx related to HTN emergency P -MRI pending  -If abnormal MRI, engage neuro   Hypertensive emergency -Presenting SBP > 240  -slurred speech, weakness, confusion, HA -CT H neg -UDS neg P -ICU monitoring while on vasoactive medication -started on cleviprex in ED with SBP now 160-180 -Goal SBP 160-180 x next 12-24 hrs -Will add PO agents (previously Rx norvasc), as well as IV lasix and try to wean off of cleviprex gtt  -trend renal indices  Hypocalcemia Hyperkalemia P -replace Calcium -Giving lasix.  -recheck BMP    Best practice:  Diet: NPO Pain/Anxiety/Delirium protocol (if indicated):na VAP protocol (if indicated): na DVT prophylaxis: Lovenox  GI prophylaxis: protonix  Glucose control: monitor  Mobility: BR  Code Status: Full  Family Communication: pt updated at bedside Disposition: ICU   Labs   CBC: Recent Labs  Lab 12/24/19 1349 12/24/19 1417  WBC  --  5.0  NEUTROABS  --  2.7  HGB 13.9 13.0  HCT 41.0 42.6  MCV  --  84.4  PLT  --  938    Basic Metabolic Panel: Recent Labs  Lab 12/24/19 1339 12/24/19 1349  NA 139 136  K 5.8* 5.7*  CL 104 106  CO2 26  --   GLUCOSE 101* 94  BUN 11 15  CREATININE 1.01 1.00  CALCIUM 8.8*  --    GFR: Estimated Creatinine Clearance: 82.7 mL/min (by C-G formula based on SCr of 1 mg/dL). Recent Labs  Lab 12/24/19 1417  WBC 5.0    Liver  Function Tests: Recent Labs  Lab 12/24/19 1339  AST 45*  ALT 15  ALKPHOS 49  BILITOT 1.6*  PROT 6.6  ALBUMIN 3.3*   No results for input(s): LIPASE, AMYLASE in the last 168 hours. No results for input(s): AMMONIA in the last 168 hours.  ABG    Component Value Date/Time   TCO2 27 12/24/2019 1349     Coagulation Profile: No results for input(s): INR, PROTIME in the last 168 hours.  Cardiac Enzymes: No results for input(s): CKTOTAL, CKMB, CKMBINDEX, TROPONINI in the last 168 hours.  HbA1C: Hgb A1c MFr Bld  Date/Time Value Ref Range Status  11/07/2014 02:23 AM 6.5 (H) 4.8 - 5.6 % Final    Comment:    (NOTE)         Pre-diabetes: 5.7 - 6.4         Diabetes: >6.4         Glycemic control for adults with diabetes: <7.0     CBG: Recent Labs  Lab 12/24/19 1338  GLUCAP 88    Review of Systems:   Very limited  ROS due to degree of slurred speech  Denies blurry vision, double vision Endorses HA, at baseline. Endorses weakness, dizziness, frequent falls, slurred speech, confusion   Past Medical History  He,  has a past medical history of Blood clot in vein, Cataract, Hypertension, and Hypertensive retinopathy.   Surgical History   History reviewed. No pertinent surgical history.   Social History   reports that he has never smoked. He has never used smokeless tobacco. He reports that he does not drink alcohol or use drugs.   Family History   His family history includes Colon cancer in his father; Dementia in his mother; Diabetes in his brother; Stroke in his sister.   Allergies Allergies  Allergen Reactions  . Aspirin     When takes a lot of aspirin-- reaction unknown     Home Medications  Prior to Admission medications   Medication Sig Start Date End Date Taking? Authorizing Provider  amLODipine (NORVASC) 10 MG tablet Take 1 tablet (10 mg total) by mouth daily. 11/06/18   Pattricia Boss, MD  aspirin EC 81 MG tablet Take 1 tablet (81 mg total) by mouth daily.  11/08/14   Domenic Polite, MD  cephALEXin (KEFLEX) 500 MG capsule Take 1 capsule (500 mg total) by mouth every 6 (six) hours. For 8days 11/08/14   Domenic Polite, MD  hydrochlorothiazide (HYDRODIURIL) 25 MG tablet Take 1 tablet (25 mg total) by mouth daily. 11/08/14   Domenic Polite, MD  oxyCODONE-acetaminophen (PERCOCET/ROXICET) 5-325 MG per tablet Take 1 tablet by mouth every 4 (four) hours as needed for moderate pain. 11/08/14   Domenic Polite, MD     Critical care time: 50 min     CRITICAL CARE Performed by: Cristal Generous   Total critical care time:  50 minutes  Critical care time was exclusive of separately billable procedures and treating other patients.  Critical care was necessary to treat or prevent imminent or life-threatening deterioration.  Critical care was time spent personally by me on the following activities: development of treatment plan with patient and/or surrogate as well as nursing, discussions with consultants, evaluation of patient's response to treatment, examination of patient, obtaining history from patient or surrogate, ordering and performing treatments and interventions, ordering and review of laboratory studies, ordering and review of radiographic studies, pulse oximetry and re-evaluation of patient's condition.  Tessie Fass MSN, AGACNP-BC [facility] [phone] If no answer, [phone] 12/24/2019, 4:28 PM

## 2019-12-24 NOTE — ED Notes (Signed)
ED TO INPATIENT HANDOFF REPORT  ED Nurse Name and Phone #: [phone] Wendie Simmer., rN  S Name/Age/Gender Brandon Perkins 69 y.o. male Room/Bed: 022C/022C  Code Status   Code Status: Full Code  Home/SNF/Other Home Patient oriented to: self, place, time and situation Is this baseline? Yes   Triage Complete: Triage complete  Chief Complaint Hypertensive emergency [I16.1]  Triage Note Pt BIB [facility] from [facility], c/o slurred speech since Monday. Pt r sided weakness, ankle chronically weak from previous fx. Pt  Hypertensive at 240/104, noncompliant w/ BP meds.    Allergies Allergies  Allergen Reactions  . Aspirin     When takes a lot of aspirin-- reaction unknown    Level of Care/Admitting Diagnosis ED Disposition    ED Disposition Condition Comment   Admit  Hospital Area: [facility] [[id number]]  Level of Care: ICU [6]  May admit patient to [facility] or [facility] if equivalent level of care is available:: No  Covid Evaluation: Asymptomatic Screening Protocol (No Symptoms)  Diagnosis: Hypertensive emergency [295621]  Admitting Physician: Lynnell Catalan [[id number]]  Attending Physician: Lynnell Catalan [[id number]]  Estimated length of stay: past midnight tomorrow  Certification:: I certify this patient will need inpatient services for at least 2 midnights       B Medical/Surgery History Past Medical History:  Diagnosis Date  . Blood clot in vein    left arm vein blood clot, not know the detail, treated with ASA per pt  . Cataract    OU  . Hypertension   . Hypertensive retinopathy    OU   History reviewed. No pertinent surgical history.   A IV Location/Drains/Wounds Patient Lines/Drains/Airways Status   Active Line/Drains/Airways    Name:   Placement date:   Placement time:   Site:   Days:   Peripheral IV 12/24/19 Right Antecubital   12/24/19    1206    Antecubital   less than 1   Peripheral IV 12/24/19 Left Wrist   12/24/19    1854    Wrist   less than 1    Wound / Incision (Open or Dehisced) 11/06/14 Other (Comment) Hand Left lacerations noted to left hand - tendons present   11/06/14    1352    Hand   1874          Intake/Output Last 24 hours No intake or output data in the 24 hours ending 12/24/19 2102  Labs/Imaging Results for orders placed or performed during the hospital encounter of 12/24/19 (from the past 48 hour(s))  CBG monitoring, ED     Status: None   Collection Time: 12/24/19  1:38 PM  Result Value Ref Range   Glucose-Capillary 88 70 - 99 mg/dL    Comment: Glucose reference range applies only to samples taken after fasting for at least 8 hours.  Comprehensive metabolic panel     Status: Abnormal   Collection Time: 12/24/19  1:39 PM  Result Value Ref Range   Sodium 139 135 - 145 mmol/L   Potassium 5.8 (H) 3.5 - 5.1 mmol/L    Comment: SPECIMEN HEMOLYZED. HEMOLYSIS MAY AFFECT INTEGRITY OF RESULTS.   Chloride 104 98 - 111 mmol/L   CO2 26 22 - 32 mmol/L   Glucose, Bld 101 (H) 70 - 99 mg/dL    Comment: Glucose reference range applies only to samples taken after fasting for at least 8 hours.   BUN 11 8 - 23 mg/dL   Creatinine, Ser 4.69 0.61 -  1.24 mg/dL   Calcium 8.8 (L) 8.9 - 10.3 mg/dL   Total Protein 6.6 6.5 - 8.1 g/dL   Albumin 3.3 (L) 3.5 - 5.0 g/dL   AST 45 (H) 15 - 41 U/L   ALT 15 0 - 44 U/L   Alkaline Phosphatase 49 38 - 126 U/L   Total Bilirubin 1.6 (H) 0.3 - 1.2 mg/dL   GFR calc non Af Amer >60 >60 mL/min   GFR calc Af Amer >60 >60 mL/min   Anion gap 9 5 - 15    Comment: Performed at [facility], [address] [place], [place] [address]  Ethanol     Status: None   Collection Time: 12/24/19  1:39 PM  Result Value Ref Range   Alcohol, Ethyl (B) <10 <10 mg/dL    Comment: (NOTE) Lowest detectable limit for serum alcohol is 10 mg/dL. For medical purposes only. Performed at [facility], [address], [place], [place] [address]   Urine rapid drug screen (hosp performed)     Status: None    Collection Time: 12/24/19  1:39 PM  Result Value Ref Range   Opiates NONE DETECTED NONE DETECTED   Cocaine NONE DETECTED NONE DETECTED   Benzodiazepines NONE DETECTED NONE DETECTED   Amphetamines NONE DETECTED NONE DETECTED   Tetrahydrocannabinol NONE DETECTED NONE DETECTED   Barbiturates NONE DETECTED NONE DETECTED    Comment: (NOTE) DRUG SCREEN FOR MEDICAL PURPOSES ONLY.  IF CONFIRMATION IS NEEDED FOR ANY PURPOSE, NOTIFY LAB WITHIN 5 DAYS. LOWEST DETECTABLE LIMITS FOR URINE DRUG SCREEN Drug Class                     Cutoff (ng/mL) Amphetamine and metabolites    1000 Barbiturate and metabolites    200 Benzodiazepine                 200 Tricyclics and metabolites     300 Opiates and metabolites        300 Cocaine and metabolites        300 THC                            50 Performed at [facility], [address], [place], [place] [address]   Urinalysis, Routine w reflex microscopic     Status: Abnormal   Collection Time: 12/24/19  1:39 PM  Result Value Ref Range   Color, Urine YELLOW YELLOW   APPearance CLEAR CLEAR   Specific Gravity, Urine 1.018 1.005 - 1.030   pH 7.0 5.0 - 8.0   Glucose, UA NEGATIVE NEGATIVE mg/dL   Hgb urine dipstick NEGATIVE NEGATIVE   Bilirubin Urine NEGATIVE NEGATIVE   Ketones, ur NEGATIVE NEGATIVE mg/dL   Protein, ur NEGATIVE NEGATIVE mg/dL   Nitrite NEGATIVE NEGATIVE   Leukocytes,Ua TRACE (A) NEGATIVE   WBC, UA 11-20 0 - 5 WBC/hpf   Bacteria, UA RARE (A) NONE SEEN   Mucus PRESENT     Comment: Performed at [facility], [address] [place], [place] [address]  I-stat chem 8, ED (not at [facility] or [facility])     Status: Abnormal   Collection Time: 12/24/19  1:49 PM  Result Value Ref Range   Sodium 136 135 - 145 mmol/L   Potassium 5.7 (H) 3.5 - 5.1 mmol/L   Chloride 106 98 - 111 mmol/L   BUN 15 8 - 23 mg/dL   Creatinine,  Ser 1.00 0.61 - 1.24 mg/dL   Glucose, Bld 94 70 - 99 mg/dL    Comment: Glucose reference range applies only to  samples taken after fasting for at least 8 hours.   Calcium, Ion 0.99 (L) 1.15 - 1.40 mmol/L   TCO2 27 22 - 32 mmol/L   Hemoglobin 13.9 13.0 - 17.0 g/dL   HCT 75.6 43.3 - 29.5 %  CBC with Differential     Status: Abnormal   Collection Time: 12/24/19  2:17 PM  Result Value Ref Range   WBC 5.0 4.0 - 10.5 K/uL   RBC 5.05 4.22 - 5.81 MIL/uL   Hemoglobin 13.0 13.0 - 17.0 g/dL   HCT 18.8 41.6 - 60.6 %   MCV 84.4 80.0 - 100.0 fL   MCH 25.7 (L) 26.0 - 34.0 pg   MCHC 30.5 30.0 - 36.0 g/dL   RDW 30.1 (H) 60.1 - 09.3 %   Platelets 179 150 - 400 K/uL   nRBC 0.0 0.0 - 0.2 %   Neutrophils Relative % 53 %   Neutro Abs 2.7 1.7 - 7.7 K/uL   Lymphocytes Relative 36 %   Lymphs Abs 1.8 0.7 - 4.0 K/uL   Monocytes Relative 9 %   Monocytes Absolute 0.5 0.1 - 1.0 K/uL   Eosinophils Relative 1 %   Eosinophils Absolute 0.1 0.0 - 0.5 K/uL   Basophils Relative 1 %   Basophils Absolute 0.0 0.0 - 0.1 K/uL   Immature Granulocytes 0 %   Abs Immature Granulocytes 0.01 0.00 - 0.07 K/uL    Comment: Performed at [facility], [address] [place], [place] [address]  Magnesium     Status: None   Collection Time: 12/24/19  6:49 PM  Result Value Ref Range   Magnesium 1.7 1.7 - 2.4 mg/dL    Comment: Performed at [facility], [address], [place], [place] [address]  SARS Coronavirus 2 by RT PCR (hospital order, performed in [facility] hospital lab) Nasopharyngeal Nasopharyngeal Swab     Status: None   Collection Time: 12/24/19  6:49 PM   Specimen: Nasopharyngeal Swab  Result Value Ref Range   SARS Coronavirus 2 NEGATIVE NEGATIVE    Comment: (NOTE) SARS-CoV-2 target nucleic acids are NOT DETECTED. The SARS-CoV-2 RNA is generally detectable in upper and lower respiratory specimens during the acute phase of infection. The lowest concentration of SARS-CoV-2 viral copies this assay can detect is 250 copies / mL. A negative result does not preclude SARS-CoV-2 infection and should not be used as  the sole basis for treatment or other patient management decisions.  A negative result may occur with improper specimen collection / handling, submission of specimen other than nasopharyngeal swab, presence of viral mutation(s) within the areas targeted by this assay, and inadequate number of viral copies (<250 copies / mL). A negative result must be combined with clinical observations, patient history, and epidemiological information. Fact Sheet for Patients:   BoilerBrush.com.cy Fact Sheet for Healthcare Providers: https://pope.com/ This test is not yet approved or cleared  by the [place] FDA and has been authorized for detection and/or diagnosis of SARS-CoV-2 by FDA under an Emergency Use Authorization (EUA).  This EUA will remain in effect (meaning this test can be used) for the duration of the COVID-19 declaration under Section 564(b)(1) of the Act, 21 U.S.C. section 360bbb-3(b)(1), unless the authorization is terminated or revoked sooner. Performed at [facility], [address] [place], [place] [address]  Basic metabolic panel     Status: Abnormal   Collection Time: 12/24/19  6:49 PM  Result Value Ref Range   Sodium 139 135 - 145 mmol/L   Potassium 3.9 3.5 - 5.1 mmol/L   Chloride 103 98 - 111 mmol/L   CO2 25 22 - 32 mmol/L   Glucose, Bld 124 (H) 70 - 99 mg/dL    Comment: Glucose reference range applies only to samples taken after fasting for at least 8 hours.   BUN 9 8 - 23 mg/dL   Creatinine, Ser 0.270.90 0.61 - 1.24 mg/dL   Calcium 9.3 8.9 - 25.310.3 mg/dL   GFR calc non Af Amer >60 >60 mL/min   GFR calc Af Amer >60 >60 mL/min   Anion gap 11 5 - 15    Comment: Performed at [facility], 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 EXAM: CT ANGIOGRAPHY HEAD AND NECK TECHNIQUE: Multidetector CT imaging of the head and neck was performed using the standard protocol during bolus administration of intravenous contrast. Multiplanar CT image reconstructions and MIPs were obtained to evaluate the vascular anatomy. Carotid stenosis measurements (when applicable) are obtained utilizing NASCET criteria, using the distal internal carotid diameter as the denominator. CONTRAST:  75mL OMNIPAQUE IOHEXOL 350 MG/ML SOLN COMPARISON:  Head CT and MRI earlier today. FINDINGS: CTA NECK Skeleton: Advanced cervical spine disc and endplate degeneration with reversal of lordosis. Carious posterior right maxillary dentition. No acute osseous abnormality identified. Upper chest: Negative. Other neck: No acute findings. Aortic arch: 3 vessel arch configuration with mild for age arch atherosclerosis. Right carotid system: Tortuous brachiocephalic artery without plaque or stenosis. Normal proximal right CCA. There is low-density soft plaque within the medial right CCA at the level of the thyroid cartilage on series 5, image 126, but no significant stenosis. Capacious right carotid bifurcation with minimal calcified plaque of right bulb. Tortuous right ICA just below the skull base without stenosis. Left carotid system: Subtle soft plaque in the left CCA which is mildly tortuous. Mild calcified plaque in the posterior left ICA bulb without stenosis. Similar left ICA tortuosity at C1-C2. Vertebral arteries: Mildly tortuous proximal right subclavian artery without plaque or stenosis. Normal right vertebral artery origin. The right vertebral is patent to the skull base without plaque or stenosis. No proximal left subclavian artery plaque or stenosis. Normal left vertebral artery origin. Codominant left vertebral is patent to the skull base without plaque or stenosis. CTA HEAD Posterior circulation: Codominant V4 segments with minimal calcified plaque. No stenosis on the  left. There is mild right V4 segment stenosis (series 7, image 145) which may be related to soft plaque. Both PICA origins remain patent. Patent vertebrobasilar junction. Patent and normal proximal and mid basilar artery. However, the distal basilar tapers, with patent but irregular basilar tip, highly irregular bilateral SCA and PCA origins (series 11, image 22). There is at least partial patency of the right SCA visible on series 8, image 102, but all 4 basilar artery branches in this region are highly irregular. The P2 and P3 segments appear more normal, although remain moderately irregular. No PCA branch occlusion is identified. Anterior circulation: Both ICA siphons are patent with mild ectasia, minimal calcified plaque and no stenosis. Normal ophthalmic artery origins. Posterior communicating arteries are diminutive or  absent. Patent carotid termini but severe irregularity and stenosis at both ACA origins rim anise int of that at the basilar tip (series 11, image 19). Both MCA origins are more normal. Bilateral ACAs are patent but diminutive and irregular. There is moderate to severe irregularity and stenosis of mid and distal A2 segments (series 12, image 20). Mildly irregular left MCA M1 without stenosis. Patent left MCA bifurcation. Mildly irregular left MCA branches. Tapered and irregular right MCA M1 which becomes moderately irregular and stenotic at the right MCA bifurcation (series 11, image 17). However, the major right MCA branches remain patent although moderately irregular throughout (series 12, image 13). Venous sinuses: Early contrast timing, not well evaluated. Anatomic variants: None. Review of the MIP images confirms the above findings IMPRESSION: 1. 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 2. But negative for any complete occlusion; and the Right SCA remains at least partially patent. 3.  Also widespread irregularity and stenoses of the second order intracranial branches, including moderate stenosis at the Right MCA bifurcation. 4. The above is favored due to advanced intracranial atherosclerosis, despite only minimal/mild atherosclerosis in the neck. There is no significant carotid or vertebral artery stenosis. Salient findings were communicated to Dr. Otelia Limes at 8:16 pm on 12/24/2019 by text page via the [facility] messaging system. Electronically Signed   By: Odessa Fleming M.D.   On: 12/24/2019 20:16   CT Head Wo Contrast  Result Date: 12/24/2019 CLINICAL DATA:  Focal neuro deficit.  Aphasia.  Stroke suspected. EXAM: CT HEAD WITHOUT CONTRAST TECHNIQUE: Contiguous axial images were obtained from the base of the skull through the vertex without intravenous contrast. COMPARISON:  None. FINDINGS: Brain: There is no evidence of acute intracranial hemorrhage, mass lesion, brain edema or extra-axial fluid collection. There is atrophy with diffuse prominence of the ventricles and subarachnoid spaces. Cavum septum pellucidum and vergae noted incidentally. Multifocal encephalomalacia is present, most notably in the left occipital lobe and in both cerebellar hemispheres. The right cerebellar component is slightly less distinct and could be a late subacute infarct. Old lacunar infarcts are present within the right lentiform nuclei and pons. There are chronic small vessel ischemic changes in the periventricular white matter. There is no CT evidence of acute cortical infarction. Vascular: Intracranial vascular calcifications. No hyperdense vessel identified. Skull: Negative for fracture or suspicious focal lesion. Sinuses/Orbits: Left maxillary sinus polyp or mucous retention cysts. The visualized paranasal sinuses, mastoid air cells and middle ears are otherwise clear. No significant orbital findings. Other: None. IMPRESSION: 1. No CT evidence of acute cortical infarction or hemorrhage. 2. Multifocal encephalomalacia  as described. Right cerebellar component could reflect a late subacute infarct. 3. Atrophy and chronic small vessel ischemic changes. Electronically Signed   By: Carey Bullocks M.D.   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 EXAM: CT ANGIOGRAPHY HEAD AND NECK TECHNIQUE: Multidetector CT imaging of the head and neck was performed using the standard protocol during bolus administration of intravenous contrast. Multiplanar CT image reconstructions and MIPs were obtained to evaluate the vascular anatomy. Carotid stenosis measurements (when applicable) are obtained utilizing NASCET criteria, using the distal internal carotid diameter as the denominator. CONTRAST:  75mL OMNIPAQUE IOHEXOL 350 MG/ML SOLN COMPARISON:  Head CT and  MRI earlier today. FINDINGS: CTA NECK Skeleton: Advanced cervical spine disc and endplate degeneration with reversal of lordosis. Carious posterior right maxillary dentition. No acute osseous abnormality identified. Upper chest: Negative. Other neck: No acute findings. Aortic arch: 3 vessel arch configuration with mild for age arch atherosclerosis. Right carotid system: Tortuous brachiocephalic artery without plaque or stenosis. Normal proximal right CCA. There is low-density soft plaque within the medial right CCA at the level of the thyroid cartilage on series 5, image 126, but no significant stenosis. Capacious right carotid bifurcation with minimal calcified plaque of right bulb. Tortuous right ICA just below the skull base without stenosis. Left carotid system: Subtle soft plaque in the left CCA which is mildly tortuous. Mild calcified plaque in the posterior left ICA bulb without stenosis. Similar left ICA tortuosity at C1-C2. Vertebral arteries: Mildly tortuous proximal right subclavian artery without plaque or  stenosis. Normal right vertebral artery origin. The right vertebral is patent to the skull base without plaque or stenosis. No proximal left subclavian artery plaque or stenosis. Normal left vertebral artery origin. Codominant left vertebral is patent to the skull base without plaque or stenosis. CTA HEAD Posterior circulation: Codominant V4 segments with minimal calcified plaque. No stenosis on the left. There is mild right V4 segment stenosis (series 7, image 145) which may be related to soft plaque. Both PICA origins remain patent. Patent vertebrobasilar junction. Patent and normal proximal and mid basilar artery. However, the distal basilar tapers, with patent but irregular basilar tip, highly irregular bilateral SCA and PCA origins (series 11, image 22). There is at least partial patency of the right SCA visible on series 8, image 102, but all 4 basilar artery branches in this region are highly irregular. The P2 and P3 segments appear more normal, although remain moderately irregular. No PCA branch occlusion is identified. Anterior circulation: Both ICA siphons are patent with mild ectasia, minimal calcified plaque and no stenosis. Normal ophthalmic artery origins. Posterior communicating arteries are diminutive or absent. Patent carotid termini but severe irregularity and stenosis at both ACA origins rim anise int of that at the basilar tip (series 11, image 19). Both MCA origins are more normal. Bilateral ACAs are patent but diminutive and irregular. There is moderate to severe irregularity and stenosis of mid and distal A2 segments (series 12, image 20). Mildly irregular left MCA M1 without stenosis. Patent left MCA bifurcation. Mildly irregular left MCA branches. Tapered and irregular right MCA M1 which becomes moderately irregular and stenotic at the right MCA bifurcation (series 11, image 17). However, the major right MCA branches remain patent although moderately irregular throughout (series 12, image  13). Venous sinuses: Early contrast timing, not well evaluated. Anatomic variants: None. Review of the MIP images confirms the above findings IMPRESSION: 1. 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 2. But negative for any complete occlusion; and the Right SCA remains at least partially patent. 3. Also widespread irregularity and stenoses of the second order intracranial branches, including moderate stenosis at the Right MCA bifurcation. 4. The above is favored due to advanced intracranial atherosclerosis, despite only minimal/mild atherosclerosis in the neck. There is no significant carotid or vertebral artery stenosis. Salient findings were communicated to Dr. Cheral Marker at 8:16 pm on 12/24/2019 by text page via the [facility] messaging system. Electronically Signed   By: Genevie Ann M.D.   On: 12/24/2019 20:16   MR BRAIN WO  CONTRAST  Result Date: 12/24/2019 CLINICAL DATA:  69 year old male with aphasia, neurologic deficit. EXAM: MRI HEAD WITHOUT CONTRAST TECHNIQUE: Multiplanar, multiecho pulse sequences of the brain and surrounding structures were obtained without intravenous contrast. COMPARISON:  Head CT 1255 hours today. FINDINGS: Brain: Patchy and confluent restricted diffusion in the right superior cerebellar artery territory (series 11, image 50). Associated T2 and FLAIR hyperintensity, T1 hypointensity, no evidence of acute hemorrhage. No posterior fossa mass effect. Superimposed chronic infarcts in the contralateral left cerebellum, bilateral pons, bilateral deep gray nuclei, bilateral corona radiata. Chronic cortical encephalomalacia in the left parietal and occipital lobes. Occasional chronic micro hemorrhages including in the left brainstem. No midline shift, mass effect, evidence of mass lesion, ventriculomegaly, extra-axial collection or acute intracranial hemorrhage. Cervicomedullary junction and  pituitary are within normal limits. Cavum septum pellucidum (normal variant). Vascular: Major intracranial vascular flow voids are preserved. Generalized intracranial artery tortuosity. Skull and upper cervical spine: Partially visible cervical spine degeneration with evidence of mild degenerative spinal stenosis at C3-C4 (series 13, image 13). Visualized bone marrow signal is within normal limits. Sinuses/Orbits: Negative orbits. Mild maxillary sinus mucous retention cysts. Other: Mastoids are clear. Visible internal auditory structures appear normal. Scalp and face soft tissues appear negative. IMPRESSION: 1. 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 No associated hemorrhage or mass effect. 2. Underlying advanced chronic ischemic disease. 3. Partially visible cervical spine degeneration with mild spinal stenosis at C3-C4. Electronically Signed   By: Odessa Fleming M.D.   On: 12/24/2019 16:58   DG Chest Portable 1 View  Result Date: 12/24/2019 CLINICAL DATA:  Hypertension.  Slurred speech. EXAM: PORTABLE CHEST 1 VIEW COMPARISON:  None. FINDINGS: Assessment heart size is limited due to portable technique. The heart may be borderline to mildly enlarged. The hila, mediastinum, lungs, and pleura are otherwise unremarkable. IMPRESSION: No acute abnormalities. Electronically Signed   By: Gerome Sam III M.D   On: 12/24/2019 13:29    Pending Labs Unresulted Labs (From admission, onward)    Start     Ordered   12/31/19 0500  Creatinine, serum  (enoxaparin (LOVENOX)    CrCl < 30 ml/min)  Weekly,   R    Comments: while on enoxaparin therapy.    12/24/19 1926   12/25/19 0500  Basic metabolic panel  Tomorrow morning,   R     12/24/19 1622   12/25/19 0500  Hemoglobin A1c  Tomorrow morning,   R     12/24/19 1830   12/25/19 0500  Lipid panel  Tomorrow morning,   R    Comments: Fasting    12/24/19 1830   12/24/19 1926  CBC   (enoxaparin (LOVENOX)    CrCl < 30 ml/min)  Once,   STAT    Comments: Baseline for enoxaparin therapy IF NOT ALREADY DRAWN.  Notify MD if PLT < 100 K.    12/24/19 1926   12/24/19 1926  Creatinine, serum  (enoxaparin (LOVENOX)    CrCl < 30 ml/min)  Once,   STAT    Comments: Baseline for enoxaparin therapy IF NOT ALREADY DRAWN.    12/24/19 1926   12/24/19 1925  HIV Antibody (routine testing w rflx)  (HIV Antibody (Routine testing w reflex) panel)  Once,   STAT     12/24/19 1926   12/24/19 1246  CBC with Differential  Once,   STAT     12/24/19 1246  Vitals/Pain Today's Vitals   12/24/19 2040 12/24/19 2041 12/24/19 2045 12/24/19 2050  BP: (!) 149/75  (!) 155/75 (!) 170/79  Pulse: 92 80 62 87  Resp:   (!) 23 (!) 28  Temp:      TempSrc:      SpO2: 93% 96% 96% 98%  Weight:      Height:      PainSc:        Isolation Precautions No active isolations  Medications Medications  clevidipine (CLEVIPREX) infusion 0.5 mg/mL (21 mg/hr Intravenous Rate/Dose Change 12/24/19 2043)  amLODipine (NORVASC) tablet 10 mg (10 mg Oral Given 12/24/19 1852)   stroke: mapping our early stages of recovery book (0 each Does not apply Hold 12/24/19 1910)  docusate sodium (COLACE) capsule 100 mg (has no administration in time range)  polyethylene glycol (MIRALAX / GLYCOLAX) packet 17 g (has no administration in time range)  enoxaparin (LOVENOX) injection 40 mg (has no administration in time range)  calcium gluconate 1 g/ 50 mL sodium chloride IVPB (0 g Intravenous Stopped 12/24/19 2009)  furosemide (LASIX) injection 40 mg (40 mg Intravenous Given 12/24/19 1854)  iohexol (OMNIPAQUE) 350 MG/ML injection 75 mL (75 mLs Intravenous Contrast Given 12/24/19 1951)    Mobility walks Moderate fall risk   Focused Assessments Neuro Assessment Handoff:  Swallow screen pass? Yes    NIH Stroke Scale ( + Modified Stroke Scale Criteria)  Interval: Initial Level of Consciousness (1a.)   : Alert, keenly  responsive LOC Questions (1b. )   +: Answers both questions correctly LOC Commands (1c. )   + : Performs both tasks correctly Best Gaze (2. )  +: Normal Visual (3. )  +: No visual loss Facial Palsy (4. )    : Normal symmetrical movements Motor Arm, Left (5a. )   +: No drift Motor Arm, Right (5b. )   +: No drift Motor Leg, Left (6a. )   +: No drift Motor Leg, Right (6b. )   +: Drift Limb Ataxia (7. ): Absent Sensory (8. )   +: Normal, no sensory loss Best Language (9. )   +: No aphasia Dysarthria (10. ): Mild-to-moderate dysarthria, patient slurs at least some words and, at worst, can be understood with some difficulty Extinction/Inattention (11.)   +: No Abnormality Modified SS Total  +: 1 Complete NIHSS TOTAL: 2 Last date known well: 12/18/19   Neuro Assessment: Exceptions to WDL Neuro Checks:   Initial (12/24/19 1300)  Last Documented NIHSS Modified Score: 1 (12/24/19 1915) Has TPA been given? No If patient is a Neuro Trauma and patient is going to OR before floor call report to 4N Charge nurse: [phone] or [phone]     R Recommendations: See Admitting Provider Note  Report given to:   Additional Notes:

## 2019-12-25 ENCOUNTER — Inpatient Hospital Stay (HOSPITAL_COMMUNITY): Payer: Medicare Other

## 2019-12-25 DIAGNOSIS — I6330 Cerebral infarction due to thrombosis of unspecified cerebral artery: Secondary | ICD-10-CM | POA: Insufficient documentation

## 2019-12-25 LAB — GLUCOSE, CAPILLARY: Glucose-Capillary: 102 mg/dL — ABNORMAL HIGH (ref 70–99)

## 2019-12-25 LAB — HEMOGLOBIN A1C
Hgb A1c MFr Bld: 6 % — ABNORMAL HIGH (ref 4.8–5.6)
Mean Plasma Glucose: 125.5 mg/dL

## 2019-12-25 LAB — BASIC METABOLIC PANEL
Anion gap: 13 (ref 5–15)
BUN: 10 mg/dL (ref 8–23)
CO2: 28 mmol/L (ref 22–32)
Calcium: 9.4 mg/dL (ref 8.9–10.3)
Chloride: 99 mmol/L (ref 98–111)
Creatinine, Ser: 0.94 mg/dL (ref 0.61–1.24)
GFR calc Af Amer: >60 mL/min (ref >60)
GFR calc non Af Amer: >60 mL/min (ref >60)
Glucose, Bld: 120 mg/dL — ABNORMAL HIGH (ref 70–99)
Potassium: 3.6 mmol/L (ref 3.5–5.1)
Sodium: 140 mmol/L (ref 135–145)

## 2019-12-25 LAB — LIPID PANEL
Cholesterol: 202 mg/dL — ABNORMAL HIGH (ref 0–200)
HDL: 64 mg/dL (ref >40)
LDL Cholesterol: 128 mg/dL — ABNORMAL HIGH (ref 0–99)
Total CHOL/HDL Ratio: 3.2 RATIO
Triglycerides: 51 mg/dL (ref <150)
VLDL: 10 mg/dL (ref 0–40)

## 2019-12-25 LAB — MRSA PCR SCREENING: MRSA by PCR: NEGATIVE

## 2019-12-25 LAB — HIV ANTIBODY (ROUTINE TESTING W REFLEX): HIV Screen 4th Generation wRfx: NONREACTIVE

## 2019-12-25 MED ORDER — CLOPIDOGREL BISULFATE 75 MG PO TABS
75.0000 mg | ORAL_TABLET | Freq: Every day | ORAL | Status: DC
  Administered 2019-12-25 – 2019-12-29 (×5): 75 mg via ORAL
  Filled 2019-12-25 (×5): qty 1

## 2019-12-25 MED ORDER — ASPIRIN EC 81 MG PO TBEC
81.0000 mg | DELAYED_RELEASE_TABLET | Freq: Every day | ORAL | Status: DC
  Administered 2019-12-25 – 2019-12-29 (×5): 81 mg via ORAL
  Filled 2019-12-25 (×5): qty 1

## 2019-12-25 MED ORDER — CHLORHEXIDINE GLUCONATE CLOTH 2 % EX PADS
6.0000 | MEDICATED_PAD | Freq: Every day | CUTANEOUS | Status: DC
  Administered 2019-12-25 – 2019-12-29 (×5): 6 via TOPICAL

## 2019-12-25 MED ORDER — CHLORTHALIDONE 25 MG PO TABS
25.0000 mg | ORAL_TABLET | Freq: Every day | ORAL | Status: DC
  Administered 2019-12-25 – 2019-12-29 (×5): 25 mg via ORAL
  Filled 2019-12-25 (×5): qty 1

## 2019-12-25 MED ORDER — ATORVASTATIN CALCIUM 40 MG PO TABS
40.0000 mg | ORAL_TABLET | Freq: Every day | ORAL | Status: DC
  Administered 2019-12-25 – 2019-12-29 (×5): 40 mg via ORAL
  Filled 2019-12-25 (×5): qty 1

## 2019-12-25 NOTE — TOC CAGE-AID Note (Signed)
Transition of Care [facility]) - CAGE-AID Screening   Patient Details  Name: Brandon Perkins MRN: [id number] Date of Birth: 1951/04/22  Transition of Care [facility]) CM/SW Contact:    Jimmy Picket, [place] Phone Number: 12/25/2019, 11:22 AM   Clinical Narrative:  Pt denied alcohol use and substance use.   CAGE-AID Screening:    Have You Ever Felt You Ought to Cut Down on Your Drinking or Drug Use?: No Have People Annoyed You By Critizing Your Drinking Or Drug Use?: No Have You Felt Bad Or Guilty About Your Drinking Or Drug Use?: No Have You Ever Had a Drink or Used Drugs First Thing In The Morning to STeady Your Nerves or to Get Rid of a Hangover?: No CAGE-AID Score: 0  Substance Abuse Education Offered: No       Denita Lung, Bridget Hartshorn Clinical Social Worker [phone]

## 2019-12-25 NOTE — Evaluation (Addendum)
Occupational Therapy Evaluation Patient Details Name: Brandon Perkins MRN: [id number] DOB: [birth year] Today's Date: 12/25/2019    History of Present Illness 69 yo male with PMHx of poorly-controlled HTN, GIB, hypertensive retinopathy, cataract, unspecified "arm clot" who presented to UC with slurred speech, weakness, confusion. At UC, SBP > 200, referred to ED, and found to have SBP >240. 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   Clinical Impression   This 69 y/o male presents with the above. PTA pt reports living alone and reporting mod independence with ADL, iADL and functional mobility using SPC. Pt pleasant and willing to participate in therapy session today. Pt with dysarthric and soft spoken speech, often having to repeat himself during session. Pt overall requiring minA (+2 safety/equipment) for functional mobility using RW, requiring minA for LB ADL and setup/minguard assist for seated UB and grooming ADL. BP/orthostatics monitored throughout session (see vitals section for full detail), pre activity BP 175/87, post mobility BP 176/81. Pt will benefit from continued acute OT services, feel he is appropriate for CIR level therapies at time of discharge but will have to confirm that some level family/caregiver supervision is available. Will follow.     Follow Up Recommendations  CIR;Supervision/Assistance - 24 hour    Equipment Recommendations  Tub/shower seat;Other (comment)(TBD in next venue)           Precautions / Restrictions Precautions Precautions: Fall Restrictions Weight Bearing Restrictions: No      Mobility Bed Mobility Overal bed mobility: Needs Assistance Bed Mobility: Supine to Sit     Supine to sit: Min guard;HOB elevated     General bed mobility comments: for lines and safety  Transfers Overall transfer level: Needs assistance Equipment used: Rolling walker (2 wheeled) Transfers: Sit to/from Stand Sit to Stand: Min assist;+2 physical  assistance         General transfer comment: steadying assist throughout, VCs for safe hand placement initially     Balance Overall balance assessment: Needs assistance Sitting-balance support: Feet supported Sitting balance-Leahy Scale: Good Sitting balance - Comments: donning socks EOB   Standing balance support: Bilateral upper extremity supported Standing balance-Leahy Scale: Poor Standing balance comment: overall reliant on UE support/external assist                            ADL either performed or assessed with clinical judgement   ADL Overall ADL's : Needs assistance/impaired Eating/Feeding: NPO   Grooming: Wash/dry face;Set up;Sitting   Upper Body Bathing: Set up;Min guard;Sitting   Lower Body Bathing: Minimal assistance;+2 for safety/equipment;Sit to/from stand   Upper Body Dressing : Set up;Min guard;Sitting   Lower Body Dressing: Minimal assistance;+2 for safety/equipment;Sit to/from stand Lower Body Dressing Details (indicate cue type and reason): pt was able to don socks seated EOB via figure 4 Toilet Transfer: Minimal assistance;+2 for safety/equipment;Ambulation;RW Toilet Transfer Details (indicate cue type and reason): simulated via transfer to recliner, room and hallway level mobility Toileting- Clothing Manipulation and Hygiene: Minimal assistance;Sit to/from stand;Sitting/lateral lean       Functional mobility during ADLs: Minimal assistance;+2 for safety/equipment;Rolling walker General ADL Comments: pt with generalized weakness, decreased standing balance and impaired cognition     Vision Baseline Vision/History: No visual deficits;Cataracts Patient Visual Report: No change from baseline Additional Comments: will continue to assess, appears [facility] for basic tasks today - pt able to read small font on his phone but requiring increased time/effort to do so, pt reporting he "doesn't  read well" at baseline so suspect this may be cause for  increased time      Perception     Praxis      Pertinent Vitals/Pain Pain Assessment: No/denies pain     Hand Dominance Right   Extremity/Trunk Assessment Upper Extremity Assessment Upper Extremity Assessment: Generalized weakness;LUE deficits/detail LUE Deficits / Details: LUE grossly weaker than RUE (3+/5) LUE Sensation: (denies sensation changes) LUE Coordination: WNL   Lower Extremity Assessment Lower Extremity Assessment: Defer to PT evaluation   Cervical / Trunk Assessment Cervical / Trunk Assessment: Kyphotic   Communication Communication Communication: Expressive difficulties(dysarthric speech)   Cognition Arousal/Alertness: Awake/alert Behavior During Therapy: WFL for tasks assessed/performed Overall Cognitive Status: Impaired/Different from baseline Area of Impairment: Following commands;Awareness;Problem solving;Attention;Orientation                 Orientation Level: Disoriented to;Time("2019") Current Attention Level: Selective   Following Commands: Follows one step commands with increased time;Follows one step commands consistently   Awareness: Emergent Problem Solving: Decreased initiation;Requires verbal cues;Slow processing General Comments: pt often having to repeat himself given dysarthric speech, A&O x3   General Comments  pt on 2L O2 with SpO2 >/=89% pre and post mobility, orthostatics/BP monitored (see vitals section)     Exercises     Shoulder Instructions      Home Living Family/patient expects to be discharged to:: Private residence Living Arrangements: Alone Available Help at Discharge: Family Type of Home: House       Home Layout: One level     Bathroom Shower/Tub: Teacher, early years/pre: Standard     Home Equipment: Radio producer - single point      Lives With: Alone    Prior Functioning/Environment Level of Independence: Independent with assistive device(s)        Comments: pt reports using SPC for  mobility tasks, reports friend typically assists with driving/getting groceries (reports he doesn't like going to the store). Was performing iADL tasks at home         OT Problem List: Decreased strength;Decreased range of motion;Decreased activity tolerance;Impaired balance (sitting and/or standing);Decreased cognition;Decreased safety awareness;Decreased knowledge of use of DME or AE      OT Treatment/Interventions: Self-care/ADL training;Therapeutic exercise;Energy conservation;DME and/or AE instruction;Therapeutic activities;Cognitive remediation/compensation;Patient/family education;Balance training    OT Goals(Current goals can be found in the care plan section) Acute Rehab OT Goals Patient Stated Goal: regain his independence OT Goal Formulation: With patient Time For Goal Achievement: 01/08/20 Potential to Achieve Goals: Good  OT Frequency: Min 2X/week   Barriers to D/C:            Co-evaluation PT/OT/SLP Co-Evaluation/Treatment: Yes Reason for Co-Treatment: For patient/therapist safety;To address functional/ADL transfers   OT goals addressed during session: ADL's and self-care      AM-PAC OT "6 Clicks" Daily Activity     Outcome Measure Help from another person eating meals?: A Little Help from another person taking care of personal grooming?: A Little Help from another person toileting, which includes using toliet, bedpan, or urinal?: A Little Help from another person bathing (including washing, rinsing, drying)?: A Little Help from another person to put on and taking off regular upper body clothing?: A Little Help from another person to put on and taking off regular lower body clothing?: A Little 6 Click Score: 18   End of Session Equipment Utilized During Treatment: Gait belt;Rolling walker;Oxygen Nurse Communication: Mobility status  Activity Tolerance: Patient tolerated treatment well Patient left: in chair;with call bell/phone  within reach;with chair alarm  set  OT Visit Diagnosis: Muscle weakness (generalized) (M62.81);Unsteadiness on feet (R26.81);Other symptoms and signs involving cognitive function                Time: 1023-1101 OT Time Calculation (min): 38 min Charges:  OT General Charges $OT Visit: 1 Visit OT Evaluation $OT Eval Moderate Complexity: 1 Mod  Marcy Siren, OT Acute Rehabilitation Services Pager [phone] Office [phone]   Orlando Penner 12/25/2019, 1:37 PM

## 2019-12-25 NOTE — Evaluation (Addendum)
Physical Therapy Evaluation Patient Details Name: Brandon Perkins MRN: [id number] DOB: [birth year] Today's Date: 12/25/2019   History of Present Illness  69 yo male with PMHx of poorly-controlled HTN, GIB, hypertensive retinopathy, cataract, unspecified "arm clot" who presented to UC with slurred speech, weakness, confusion. At UC, SBP > 200, referred to ED, and found to have SBP >240. 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  Clinical Impression  Patient presents with decreased mobility due to decreased balance, decreased safety awareness, decreased general strength and limited activity tolerance.  He is from home alone but had help to get groceries, reports no family who can help, but son arrived during session bringing him things from home.  Hopeful he can qualify for CIR level rehab to allow home with some initial 24 hour S. PT to follow acutely.  Orthostatic VS for the past 24 hrs (Last 3 readings):  BP- Lying Pulse- Lying BP- Sitting Pulse- Sitting BP- Standing at 0 minutes Pulse- Standing at 0 minutes BP- Standing at 3 minutes Pulse- Standing at 3 minutes  12/25/19 1000 175/87 72 (!) 168/93 80 184/87 82 176/81 75      Follow Up Recommendations CIR    Equipment Recommendations  Rolling walker with 5" wheels    Recommendations for Other Services Rehab consult     Precautions / Restrictions Precautions Precautions: Fall Restrictions Weight Bearing Restrictions: No      Mobility  Bed Mobility Overal bed mobility: Needs Assistance Bed Mobility: Supine to Sit     Supine to sit: Min guard;HOB elevated     General bed mobility comments: for lines and safety  Transfers Overall transfer level: Needs assistance Equipment used: Rolling walker (2 wheeled) Transfers: Sit to/from Stand Sit to Stand: Min assist;+2 physical assistance         General transfer comment: steadying assist throughout, VCs for safe hand placement initially    Ambulation/Gait Ambulation/Gait assistance: Min assist Gait Distance (Feet): 80 Feet Assistive device: Rolling walker (2 wheeled) Gait Pattern/deviations: Step-through pattern;Decreased stride length;Trunk flexed;Wide base of support     General Gait Details: cues for forward gaze and feet inside walker and for assist with turns with walker  Stairs            Wheelchair Mobility    Modified Rankin (Stroke Patients Only) Modified Rankin (Stroke Patients Only) Pre-Morbid Rankin Score: No symptoms Modified Rankin: Moderately severe disability     Balance Overall balance assessment: Needs assistance Sitting-balance support: Feet supported Sitting balance-Leahy Scale: Good Sitting balance - Comments: donning socks EOB   Standing balance support: Bilateral upper extremity supported Standing balance-Leahy Scale: Poor Standing balance comment: overall reliant on UE support/external assist                              Pertinent Vitals/Pain Pain Assessment: No/denies pain    Home Living Family/patient expects to be discharged to:: Private residence Living Arrangements: Alone Available Help at Discharge: Family Type of Home: House Home Access: Stairs to enter Entrance Stairs-Rails: Right Entrance Stairs-Number of Steps: 3 Home Layout: One level Home Equipment: Cane - single point      Prior Function Level of Independence: Independent with assistive device(s)         Comments: pt reports using SPC for mobility tasks, reports friend typically assists with driving/getting groceries (reports he doesn't like going to the store). Was performing iADL tasks at home      Hand Dominance  Dominant Hand: Right    Extremity/Trunk Assessment   Upper Extremity Assessment Upper Extremity Assessment: Defer to OT evaluation LUE Deficits / Details: LUE grossly weaker than RUE (3+/5) LUE Sensation: (denies sensation changes) LUE Coordination: WNL    Lower  Extremity Assessment Lower Extremity Assessment: Overall WFL for tasks assessed    Cervical / Trunk Assessment Cervical / Trunk Assessment: Kyphotic  Communication   Communication: Expressive difficulties(dysarthric speech)  Cognition Arousal/Alertness: Awake/alert Behavior During Therapy: WFL for tasks assessed/performed Overall Cognitive Status: Impaired/Different from baseline Area of Impairment: Following commands;Awareness;Problem solving;Attention;Orientation                 Orientation Level: Disoriented to;Time(" 2019") Current Attention Level: Selective   Following Commands: Follows one step commands with increased time;Follows one step commands consistently   Awareness: Emergent Problem Solving: Decreased initiation;Requires verbal cues;Slow processing General Comments: pt often having to repeat himself given dysarthric speech, A&O x3      General Comments General comments (skin integrity, edema, etc.): pm 2: O2 with SpO2 98% and higher with mobility, BP measured with mobility, see vitals    Exercises     Assessment/Plan    PT Assessment Patient needs continued PT services  PT Problem List Decreased mobility;Decreased cognition;Decreased safety awareness;Decreased knowledge of precautions;Decreased knowledge of use of DME;Decreased balance       PT Treatment Interventions DME instruction;Therapeutic activities;Balance training;Stair training;Gait training;Functional mobility training;Therapeutic exercise;Patient/family education    PT Goals (Current goals can be found in the Care Plan section)  Acute Rehab PT Goals Patient Stated Goal: regain his independence PT Goal Formulation: With patient Time For Goal Achievement: 01/08/20 Potential to Achieve Goals: Good    Frequency Min 4X/week   Barriers to discharge Decreased caregiver support      Co-evaluation PT/OT/SLP Co-Evaluation/Treatment: Yes Reason for Co-Treatment: For patient/therapist  safety;To address functional/ADL transfers PT goals addressed during session: Mobility/safety with mobility;Balance;Proper use of DME OT goals addressed during session: ADL's and self-care       AM-PAC PT "6 Clicks" Mobility  Outcome Measure Help needed turning from your back to your side while in a flat bed without using bedrails?: A Little Help needed moving from lying on your back to sitting on the side of a flat bed without using bedrails?: A Little Help needed moving to and from a bed to a chair (including a wheelchair)?: A Little Help needed standing up from a chair using your arms (e.g., wheelchair or bedside chair)?: A Little Help needed to walk in hospital room?: A Little Help needed climbing 3-5 steps with a railing? : A Lot 6 Click Score: 17    End of Session Equipment Utilized During Treatment: Gait belt Activity Tolerance: Patient tolerated treatment well Patient left: in chair;with call bell/phone within reach;with chair alarm set   PT Visit Diagnosis: Other abnormalities of gait and mobility (R26.89);Muscle weakness (generalized) (M62.81);Other symptoms and signs involving the nervous system (R29.898)    Time: 1023-1101 PT Time Calculation (min) (ACUTE ONLY): 38 min   Charges:   PT Evaluation $PT Eval Moderate Complexity: [place], [place] Acute Rehabilitation Services [phone] 12/25/2019   Reginia Naas 12/25/2019, 2:32 PM

## 2019-12-25 NOTE — Consult Note (Signed)
Physical Medicine and Rehabilitation Consult Reason for Consult: Decreased functional ability with slurred speech Referring Physician: Critical care   HPI: Brandon Perkins is a 69 y.o. right-handed male with history of hypertension.  Per chart review patient lives alone independent with assistive device prior to admission.  1 level home 3 steps to entry.  Presented 12/24/2019 with slurred speech and altered mental status.  Elevated blood pressure systolic in the 200s placed on Cleviprex.  Cranial CT scan negative for acute changes.  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  Admission chemistries with potassium 5.8, alcohol negative, SARS coronavirus negative, urinalysis negative nitrite.  Echocardiogram pending.  Currently on aspirin and Plavix for CVA prophylaxis.  Subcutaneous Lovenox for DVT prophylaxis.  Close monitoring of blood pressure.  Tolerating a regular consistency diet.  Therapy evaluations completed with recommendations of physical medicine rehab consult.   Review of Systems  Constitutional: Negative for chills and fever.  HENT: Negative for hearing loss.   Eyes: Negative for blurred vision and double vision.  Respiratory: Negative for cough and shortness of breath.   Cardiovascular: Negative for chest pain, palpitations and leg swelling.  Gastrointestinal: Positive for constipation. Negative for heartburn, nausea and vomiting.  Genitourinary: Negative for dysuria, flank pain and hematuria.  Musculoskeletal: Positive for myalgias.  Skin: Negative for rash.  Neurological: Positive for speech change and weakness.  All other systems reviewed and are negative.  Past Medical History:  Diagnosis Date  . Blood clot in vein    left arm vein blood clot, not know the detail,  treated with ASA per pt  . Cataract    OU  . Hypertension   . Hypertensive retinopathy    OU   History reviewed. No pertinent surgical history. Family History  Problem Relation Age of Onset  . Dementia Mother   . Colon cancer Father   . Stroke Sister        Sister had brain aneurysm  . Diabetes Brother    Social History:  reports that he has never smoked. He has never used smokeless tobacco. He reports that he does not drink alcohol or use drugs. Allergies:  Allergies  Allergen Reactions  . Aspirin     When takes a lot of aspirin-- reaction unknown   Medications Prior to Admission  Medication Sig Dispense Refill  . amLODipine (NORVASC) 10 MG tablet Take 1 tablet (10 mg total) by mouth daily. (Patient not taking: Reported on 12/24/2019) 30 tablet 0  . aspirin EC 81 MG tablet Take 1 tablet (81 mg total) by mouth daily. (Patient not taking: Reported on 12/24/2019)    . hydrochlorothiazide (HYDRODIURIL) 25 MG tablet Take 1 tablet (25 mg total) by mouth daily. (Patient not taking: Reported on 12/24/2019) 30 tablet 0  . oxyCODONE-acetaminophen (PERCOCET/ROXICET) 5-325 MG per tablet Take 1 tablet by mouth every 4 (four) hours as needed for moderate pain. (Patient not taking: Reported on 12/24/2019) 15 tablet 0    Home: Home Living Family/patient expects to be discharged to:: Private residence Living Arrangements: Alone Available Help at Discharge: Family Type of Home: House Home Access: Stairs to enter Secretary/administrator of Steps: 3 Entrance Stairs-Rails: Right Home Layout: One level Bathroom Shower/Tub: Engineer, manufacturing systems: Standard Home Equipment: Gilmer Mor - single point  Lives With: Alone  Functional History: Prior Function Level of Independence: Independent with assistive device(s) Comments: pt reports using SPC for mobility tasks, reports friend typically assists with driving/getting groceries (reports he doesn't like going to the store). Was performing iADL  tasks at home  Functional Status:  Mobility: Bed Mobility Overal bed mobility: Needs Assistance Bed Mobility: Supine to Sit Supine to sit: Min guard, HOB elevated General bed mobility comments: for lines and safety Transfers Overall transfer level: Needs assistance Equipment used: Rolling walker (2 wheeled) Transfers: Sit to/from Stand Sit to Stand: Min assist, +2 physical assistance General transfer comment: steadying assist throughout, VCs for safe hand placement initially  Ambulation/Gait Ambulation/Gait assistance: Min assist Gait Distance (Feet): 80 Feet Assistive device: Rolling walker (2 wheeled) Gait Pattern/deviations: Step-through pattern, Decreased stride length, Trunk flexed, Wide base of support General Gait Details: cues for forward gaze and feet inside walker and for assist with turns with walker    ADL: ADL Overall ADL's : Needs assistance/impaired Eating/Feeding: NPO Grooming: Wash/dry face, Set up, Sitting Upper Body Bathing: Set up, Min guard, Sitting Lower Body Bathing: Minimal assistance, +2 for safety/equipment, Sit to/from stand Upper Body Dressing : Set up, Min guard, Sitting Lower Body Dressing: Minimal assistance, +2 for safety/equipment, Sit to/from stand Lower Body Dressing Details (indicate cue type and reason): pt was able to don socks seated EOB via figure 4 Toilet Transfer: Minimal assistance, +2 for safety/equipment, Ambulation, RW Toilet Transfer Details (indicate cue type and reason): simulated via transfer to recliner, room and hallway level mobility Toileting- Clothing Manipulation and Hygiene: Minimal assistance, Sit to/from stand, Sitting/lateral lean Functional mobility during ADLs: Minimal assistance, +2 for safety/equipment, Rolling walker General ADL Comments: pt with generalized weakness, decreased standing balance and impaired cognition  Cognition: Cognition Overall Cognitive Status: Impaired/Different from  baseline Arousal/Alertness: Awake/alert Orientation Level: Oriented X4 Attention: Selective Selective Attention: Impaired Selective Attention Impairment: Verbal basic Memory: Impaired Memory Impairment: Retrieval deficit, Decreased recall of new information Awareness: Appears intact(intellectual awareness appears to be intact) Problem Solving: Impaired Problem Solving Impairment: Verbal complex, Verbal basic Cognition Arousal/Alertness: Awake/alert Behavior During Therapy: WFL for tasks assessed/performed Overall Cognitive Status: Impaired/Different from baseline Area of Impairment: Following commands, Awareness, Problem solving, Attention, Orientation Orientation Level: Disoriented to, Time(" 2019") Current Attention Level: Selective Following Commands: Follows one step commands with increased time, Follows one step commands consistently Awareness: Emergent Problem Solving: Decreased initiation, Requires verbal cues, Slow processing General Comments: pt often having to repeat himself given dysarthric speech, A&O x3  Blood pressure (!) 151/69, pulse 62, temperature 99 F (37.2 C), temperature source Oral, resp. rate 11, height  (1.88 m), weight 78.6 kg, SpO2 96 %. Physical Exam  Constitutional: No distress.  HENT:  Head: Normocephalic and atraumatic.  Eyes: Pupils are equal, round, and reactive to light.  Left eye lags with tracking  Cardiovascular: Normal rate.  Respiratory: Effort normal.  GI: Soft.  Musculoskeletal:        General: Normal range of motion.     Cervical back: Normal range of motion.  Neurological:  Patient is alert in no acute distress.  Makes eye contact with examiner.  Fairly dysarthric. Speech low volume. fair insight and awareness. Recalls biographical information. Slightly slow processing.  Mild right arm and leg limb ataxia. Strength 4/5 UE prox to distal. LE: 3+/5 HF, KE and 4/5 ADF/PF. No focal sensory abnl.   Skin: Skin is warm. He is not  diaphoretic.  Psychiatric: He has a normal mood and affect. His behavior is normal.  Results for orders placed or performed during the hospital encounter of 12/24/19 (from the past 24 hour(s))  Magnesium     Status: None   Collection Time: 12/24/19  6:49 PM  Result Value Ref Range   Magnesium 1.7 1.7 - 2.4 mg/dL  SARS Coronavirus 2 by RT PCR (hospital order, performed in [facility] hospital lab) Nasopharyngeal Nasopharyngeal Swab     Status: None   Collection Time: 12/24/19  6:49 PM   Specimen: Nasopharyngeal Swab  Result Value Ref Range   SARS Coronavirus 2 NEGATIVE NEGATIVE  Basic metabolic panel     Status: Abnormal   Collection Time: 12/24/19  6:49 PM  Result Value Ref Range   Sodium 139 135 - 145 mmol/L   Potassium 3.9 3.5 - 5.1 mmol/L   Chloride 103 98 - 111 mmol/L   CO2 25 22 - 32 mmol/L   Glucose, Bld 124 (H) 70 - 99 mg/dL   BUN 9 8 - 23 mg/dL   Creatinine, Ser 1.61 0.61 - 1.24 mg/dL   Calcium 9.3 8.9 - 09.6 mg/dL   GFR calc non Af Amer >60 >60 mL/min   GFR calc Af Amer >60 >60 mL/min   Anion gap 11 5 - 15  MRSA PCR Screening     Status: None   Collection Time: 12/24/19 10:14 PM   Specimen: Nasal Mucosa; Nasopharyngeal  Result Value Ref Range   MRSA by PCR NEGATIVE NEGATIVE  Basic metabolic panel     Status: Abnormal   Collection Time: 12/25/19  7:19 AM  Result Value Ref Range   Sodium 140 135 - 145 mmol/L   Potassium 3.6 3.5 - 5.1 mmol/L   Chloride 99 98 - 111 mmol/L   CO2 28 22 - 32 mmol/L   Glucose, Bld 120 (H) 70 - 99 mg/dL   BUN 10 8 - 23 mg/dL   Creatinine, Ser 0.45 0.61 - 1.24 mg/dL   Calcium 9.4 8.9 - 40.9 mg/dL   GFR calc non Af Amer >60 >60 mL/min   GFR calc Af Amer >60 >60 mL/min   Anion gap 13 5 - 15  Hemoglobin A1c     Status: Abnormal   Collection Time: 12/25/19  7:19 AM  Result Value Ref Range   Hgb A1c MFr Bld 6.0 (H) 4.8 - 5.6 %   Mean Plasma Glucose 125.5 mg/dL  HIV Antibody (routine testing w rflx)     Status: None   Collection Time:  12/25/19  7:19 AM  Result Value Ref Range   HIV Screen 4th Generation wRfx Non Reactive Non Reactive  Lipid panel     Status: Abnormal   Collection Time: 12/25/19  7:19 AM  Result Value Ref Range   Cholesterol 202 (H) 0 - 200 mg/dL   Triglycerides 51 <811 mg/dL   HDL 64 >91 mg/dL   Total CHOL/HDL Ratio 3.2 RATIO   VLDL 10 0 - 40 mg/dL   LDL Cholesterol 478 (H) 0 - 99 mg/dL   CT ANGIO HEAD W OR WO CONTRAST  Result Date: 12/24/2019 CLINICAL DATA:  69 year old male with 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 EXAM: CT ANGIOGRAPHY HEAD AND NECK TECHNIQUE: Multidetector CT imaging of the head and neck was performed using the standard protocol during bolus administration of intravenous contrast. Multiplanar CT image reconstructions and MIPs were obtained to evaluate the vascular anatomy. Carotid stenosis measurements (when applicable) are obtained utilizing NASCET criteria, using the distal internal carotid  diameter as the denominator. CONTRAST:  8mL OMNIPAQUE IOHEXOL 350 MG/ML SOLN COMPARISON:  Head CT and MRI earlier today. FINDINGS: CTA NECK Skeleton: Advanced cervical spine disc and endplate degeneration with reversal of lordosis. Carious posterior right maxillary dentition. No acute osseous abnormality identified. Upper chest: Negative. Other neck: No acute findings. Aortic arch: 3 vessel arch configuration with mild for age arch atherosclerosis. Right carotid system: Tortuous brachiocephalic artery without plaque or stenosis. Normal proximal right CCA. There is low-density soft plaque within the medial right CCA at the level of the thyroid cartilage on series 5, image 126, but no significant stenosis. Capacious right carotid bifurcation with minimal calcified plaque of right bulb. Tortuous right ICA just below the skull base without stenosis. Left carotid system: Subtle soft plaque in the left CCA which is mildly tortuous. Mild calcified plaque  in the posterior left ICA bulb without stenosis. Similar left ICA tortuosity at C1-C2. Vertebral arteries: Mildly tortuous proximal right subclavian artery without plaque or stenosis. Normal right vertebral artery origin. The right vertebral is patent to the skull base without plaque or stenosis. No proximal left subclavian artery plaque or stenosis. Normal left vertebral artery origin. Codominant left vertebral is patent to the skull base without plaque or stenosis. CTA HEAD Posterior circulation: Codominant V4 segments with minimal calcified plaque. No stenosis on the left. There is mild right V4 segment stenosis (series 7, image 145) which may be related to soft plaque. Both PICA origins remain patent. Patent vertebrobasilar junction. Patent and normal proximal and mid basilar artery. However, the distal basilar tapers, with patent but irregular basilar tip, highly irregular bilateral SCA and PCA origins (series 11, image 22). There is at least partial patency of the right SCA visible on series 8, image 102, but all 4 basilar artery branches in this region are highly irregular. The P2 and P3 segments appear more normal, although remain moderately irregular. No PCA branch occlusion is identified. Anterior circulation: Both ICA siphons are patent with mild ectasia, minimal calcified plaque and no stenosis. Normal ophthalmic artery origins. Posterior communicating arteries are diminutive or absent. Patent carotid termini but severe irregularity and stenosis at both ACA origins rim anise int of that at the basilar tip (series 11, image 19). Both MCA origins are more normal. Bilateral ACAs are patent but diminutive and irregular. There is moderate to severe irregularity and stenosis of mid and distal A2 segments (series 12, image 20). Mildly irregular left MCA M1 without stenosis. Patent left MCA bifurcation. Mildly irregular left MCA branches. Tapered and irregular right MCA M1 which becomes moderately irregular and  stenotic at the right MCA bifurcation (series 11, image 17). However, the major right MCA branches remain patent although moderately irregular throughout (series 12, image 13). Venous sinuses: Early contrast timing, not well evaluated. Anatomic variants: None. Review of the MIP images confirms the above findings IMPRESSION: 1. 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 2. But negative for any complete occlusion; and the Right SCA remains at least partially patent. 3. Also widespread irregularity and stenoses of the second order intracranial branches, including moderate stenosis at the Right MCA bifurcation. 4. The above is favored due to advanced intracranial atherosclerosis, despite only minimal/mild atherosclerosis in the neck. There is no significant carotid or vertebral artery stenosis. Salient findings were communicated to Dr. Otelia Limes at 8:16 pm on 12/24/2019 by text page via the [facility] messaging system. Electronically Signed  By: Odessa Fleming M.D.   On: 12/24/2019 20:16   CT Head Wo Contrast  Result Date: 12/24/2019 CLINICAL DATA:  Focal neuro deficit.  Aphasia.  Stroke suspected. EXAM: CT HEAD WITHOUT CONTRAST TECHNIQUE: Contiguous axial images were obtained from the base of the skull through the vertex without intravenous contrast. COMPARISON:  None. FINDINGS: Brain: There is no evidence of acute intracranial hemorrhage, mass lesion, brain edema or extra-axial fluid collection. There is atrophy with diffuse prominence of the ventricles and subarachnoid spaces. Cavum septum pellucidum and vergae noted incidentally. Multifocal encephalomalacia is present, most notably in the left occipital lobe and in both cerebellar hemispheres. The right cerebellar component is slightly less distinct and could be a late subacute infarct. Old lacunar infarcts are present within the right lentiform nuclei and pons. There are  chronic small vessel ischemic changes in the periventricular white matter. There is no CT evidence of acute cortical infarction. Vascular: Intracranial vascular calcifications. No hyperdense vessel identified. Skull: Negative for fracture or suspicious focal lesion. Sinuses/Orbits: Left maxillary sinus polyp or mucous retention cysts. The visualized paranasal sinuses, mastoid air cells and middle ears are otherwise clear. No significant orbital findings. Other: None. IMPRESSION: 1. No CT evidence of acute cortical infarction or hemorrhage. 2. Multifocal encephalomalacia as described. Right cerebellar component could reflect a late subacute infarct. 3. Atrophy and chronic small vessel ischemic changes. Electronically Signed   By: Carey Bullocks M.D.   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 EXAM: CT ANGIOGRAPHY HEAD AND NECK TECHNIQUE: Multidetector CT imaging of the head and neck was performed using the standard protocol during bolus administration of intravenous contrast. Multiplanar CT image reconstructions and MIPs were obtained to evaluate the vascular anatomy. Carotid stenosis measurements (when applicable) are obtained utilizing NASCET criteria, using the distal internal carotid diameter as the denominator. CONTRAST:  61mL OMNIPAQUE IOHEXOL 350 MG/ML SOLN COMPARISON:  Head CT and MRI earlier today. FINDINGS: CTA NECK Skeleton: Advanced cervical spine disc and endplate degeneration with reversal of lordosis. Carious posterior right maxillary dentition. No acute osseous abnormality identified. Upper chest: Negative. Other neck: No acute findings. Aortic arch: 3 vessel arch configuration with mild for age arch atherosclerosis. Right carotid system: Tortuous brachiocephalic artery without plaque or stenosis. Normal proximal right CCA. There  is low-density soft plaque within the medial right CCA at the level of the thyroid cartilage on series 5, image 126, but no significant stenosis. Capacious right carotid bifurcation with minimal calcified plaque of right bulb. Tortuous right ICA just below the skull base without stenosis. Left carotid system: Subtle soft plaque in the left CCA which is mildly tortuous. Mild calcified plaque in the posterior left ICA bulb without stenosis. Similar left ICA tortuosity at C1-C2. Vertebral arteries: Mildly tortuous proximal right subclavian artery without plaque or stenosis. Normal right vertebral artery origin. The right vertebral is patent to the skull base without plaque or stenosis. No proximal left subclavian artery plaque or stenosis. Normal left vertebral artery origin. Codominant left vertebral is patent to the skull base without plaque or stenosis. CTA HEAD Posterior circulation: Codominant V4 segments with minimal calcified plaque. No stenosis on the left. There is mild right V4 segment stenosis (series 7, image 145) which may be related to soft plaque. Both PICA origins remain patent. Patent vertebrobasilar junction. Patent and normal proximal and  mid basilar artery. However, the distal basilar tapers, with patent but irregular basilar tip, highly irregular bilateral SCA and PCA origins (series 11, image 22). There is at least partial patency of the right SCA visible on series 8, image 102, but all 4 basilar artery branches in this region are highly irregular. The P2 and P3 segments appear more normal, although remain moderately irregular. No PCA branch occlusion is identified. Anterior circulation: Both ICA siphons are patent with mild ectasia, minimal calcified plaque and no stenosis. Normal ophthalmic artery origins. Posterior communicating arteries are diminutive or absent. Patent carotid termini but severe irregularity and stenosis at both ACA origins rim anise int of that at the basilar tip (series 11,  image 19). Both MCA origins are more normal. Bilateral ACAs are patent but diminutive and irregular. There is moderate to severe irregularity and stenosis of mid and distal A2 segments (series 12, image 20). Mildly irregular left MCA M1 without stenosis. Patent left MCA bifurcation. Mildly irregular left MCA branches. Tapered and irregular right MCA M1 which becomes moderately irregular and stenotic at the right MCA bifurcation (series 11, image 17). However, the major right MCA branches remain patent although moderately irregular throughout (series 12, image 13). Venous sinuses: Early contrast timing, not well evaluated. Anatomic variants: None. Review of the MIP images confirms the above findings IMPRESSION: 1. 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 2. But negative for any complete occlusion; and the Right SCA remains at least partially patent. 3. Also widespread irregularity and stenoses of the second order intracranial branches, including moderate stenosis at the Right MCA bifurcation. 4. The above is favored due to advanced intracranial atherosclerosis, despite only minimal/mild atherosclerosis in the neck. There is no significant carotid or vertebral artery stenosis. Salient findings were communicated to Dr. Cheral Marker at 8:16 pm on 12/24/2019 by text page via the [facility] messaging system. Electronically Signed   By: Genevie Ann M.D.   On: 12/24/2019 20:16   MR BRAIN WO CONTRAST  Result Date: 12/24/2019 CLINICAL DATA:  69 year old male with aphasia, neurologic deficit. EXAM: MRI HEAD WITHOUT CONTRAST TECHNIQUE: Multiplanar, multiecho pulse sequences of the brain and surrounding structures were obtained without intravenous contrast. COMPARISON:  Head CT 1255 hours today. FINDINGS: Brain: Patchy and confluent restricted diffusion in the right superior cerebellar artery territory (series 11, image 50).  Associated T2 and FLAIR hyperintensity, T1 hypointensity, no evidence of acute hemorrhage. No posterior fossa mass effect. Superimposed chronic infarcts in the contralateral left cerebellum, bilateral pons, bilateral deep gray nuclei, bilateral corona radiata. Chronic cortical encephalomalacia in the left parietal and occipital lobes. Occasional chronic micro hemorrhages including in the left brainstem. No midline shift, mass effect, evidence of mass lesion, ventriculomegaly, extra-axial collection or acute intracranial hemorrhage. Cervicomedullary junction and pituitary are within normal limits. Cavum septum pellucidum (normal variant). Vascular: Major intracranial vascular flow voids are preserved. Generalized intracranial artery tortuosity. Skull and upper cervical spine: Partially visible cervical spine degeneration with evidence of mild degenerative spinal stenosis at C3-C4 (series 13, image 13). Visualized bone marrow signal is within normal limits. Sinuses/Orbits: Negative orbits. Mild maxillary sinus mucous retention cysts. Other: Mastoids are clear. Visible internal auditory structures appear normal. Scalp and face soft tissues appear negative. IMPRESSION: 1. 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 No associated hemorrhage or mass effect. 2. Underlying advanced chronic ischemic disease. 3. Partially  visible cervical spine degeneration with mild spinal stenosis at C3-C4. Electronically Signed   By: Odessa FlemingH  Hall M.D.   On: 12/24/2019 16:58   DG Chest Portable 1 View  Result Date: 12/24/2019 CLINICAL DATA:  Hypertension.  Slurred speech. EXAM: PORTABLE CHEST 1 VIEW COMPARISON:  None. FINDINGS: Assessment heart size is limited due to portable technique. The heart may be borderline to mildly enlarged. The hila, mediastinum, lungs, and pleura are otherwise unremarkable. IMPRESSION: No acute abnormalities. Electronically Signed   By:  Gerome Samavid  Williams III M.D   On: 12/24/2019 13:29     Assessment/Plan: Diagnosis: right superior cerebellar artery infarct 1. Does the need for close, 24 hr/day medical supervision in concert with the patient's rehab needs make it unreasonable for this patient to be served in a less intensive setting? Yes 2. Co-Morbidities requiring supervision/potential complications: HTN, electrolyte abnl/nutrition 3. Due to bladder management, bowel management, safety, skin/wound care, disease management, medication administration, pain management and patient education, does the patient require 24 hr/day rehab nursing? Yes 4. Does the patient require coordinated care of a physician, rehab nurse, therapy disciplines of PT, OT, SLP to address physical and functional deficits in the context of the above medical diagnosis(es)? Yes Addressing deficits in the following areas: balance, endurance, locomotion, strength, transferring, bowel/bladder control, bathing, dressing, feeding, grooming, toileting, cognition, speech and psychosocial support 5. Can the patient actively participate in an intensive therapy program of at least 3 hrs of therapy per day at least 5 days per week? Yes 6. The potential for patient to make measurable gains while on inpatient rehab is excellent 7. Anticipated functional outcomes upon discharge from inpatient rehab are modified independent  with PT, modified independent with OT, modified independent and supervision with SLP. 8. Estimated rehab length of stay to reach the above functional goals is: 7-11 days 9. Anticipated discharge destination: Home 10. Overall Rehab/Functional Prognosis: excellent  RECOMMENDATIONS: This patient's condition is appropriate for continued rehabilitative care in the following setting: CIR Patient has agreed to participate in recommended program. Yes Note that insurance prior authorization may be required for reimbursement for recommended care.  Comment: Rehab  Admissions Coordinator to follow up.  Thanks,  Ranelle OysterZachary T. Korene Dula, MD, [place] DomFAAPMR  I have personally performed a face to face diagnostic evaluation of this patient. Additionally, I have examined pertinent labs and radiographic images. I have reviewed and concur with the physician assistant's documentation above.    Mcarthur RossettiDaniel J Angiulli, PA-C 12/25/2019

## 2019-12-25 NOTE — CV Procedure (Signed)
Echo attempted twice both times patient was busy with other testing. Will try again later.

## 2019-12-25 NOTE — Progress Notes (Addendum)
NAME:  Brandon Perkins, MRN:  [id number], DOB:  03-27-1951, LOS: 1 ADMISSION DATE:  12/24/2019, CONSULTATION DATE:  12/24/2019 REFERRING MD:  Dr. Criss Alvine - EM, CHIEF COMPLAINT:  Slurred speech   Brief History   69 yo male with PMHx of poorly-controlled HTN, GIB, HTN retinopathy, unspecified "arm clot" who presented to UC with slurred speech, weakness, confusion. At UC, SBP > 200, referred to ED, and found to have SBP >240. 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  Past Medical History  HTN, poorly controlled, HTN urgency  HTN retinopathy GIB Near syncope "Left arm vein blood clot"  Significant Hospital Events   5/23-  referred to ED from UC with CVA-like sx and HTN emergency. Acute sup cerebellar artery infarct on MRI, admitted to [facility].  Consults:  Neurology  Procedures:  N/A  Significant Diagnostic Tests:  5/23 CT H > chronic small vessel ischemic changes. Multifocal encephalomalacia. No acute infarct or hemorrhage seen.  5/23 MRI Brain >>  Micro Data:  5/23 SARS Cov2>>   Antimicrobials:  N/A  Interim history/subjective:  Pt is awake and states that he is "feeling better." Tearful about his condition and about having Foley catheter. Denies HA, blurry vision, double vision, chest pain, leg pain, nausea, vomiting, pain elsewhere, weakness. States "I didn't know I even had high blood pressure" and that he was not taking any medications prior to admission. Nausea resolved after receiving zofran. Pt denies lack of coordination while eating.  Objective   Blood pressure (!) 165/85, pulse 75, temperature 97.8 F (36.6 C), resp. rate 11, height 6\' 2"  (1.88 m), weight 78.6 kg, SpO2 94 %.        Intake/Output Summary (Last 24 hours) at 12/25/2019 1030 Last data filed at 12/25/2019 1000 Gross per 24 hour  Intake 704.12 ml  Output 450 ml  Net 254.12 ml   Filed Weights   12/24/19 1244 12/24/19 2200  Weight: 81.6 kg 78.6 kg    Examination: General: Adult male  sitting upright in bed, no acute distress HEENT: Trachea midline. No lymphadenopathy Lungs: Clear lungs, normal respiratory effort, symmetric chest expansion. On 2L O2 Cardiovascular: RRR, nl S1S2, no m/r/g, cap refill <2s. 2+ peripheral pulses Abdomen: Normal bowel sounds, soft, flat, no rebound or guarding Extremities: Full and symmetric ROM of UE and LE. 2+ strength throughout. Neuro: AAO x3, slurred speech. CN I-XII wnl. No pronator drift. Dysmetria evident on finger-to-nose test and slightly on heel-to-shin test. GU: Small quantity of yellow/clear urine in Foley bag  Resolved Hospital Problem list     Assessment & Plan:  Acute right cerebellar stroke Pt with continued slurred speech.  - Neuro following, appreciate recs - Plavix, for secondary stroke prevention - Consider ASA if allergy is mild. Pt does not remember his reaction to ASA. - Statin, pending baseline CK - Neuro checks q1h - Telemetry - PT/OT/speech consult  Hypertensive Emergency SBP ranging 165-186.  - Infusing clevidipine - titrating to SBP 160-180. Will continue to wean off clevidipine ggt (final goal 120-140) - Oral amlodipine 10mg  - Oral chlorthalidone 25mg  - Daily orthostatic VS  Fluids and Electrolytes BMP wnl. Ca 9.4 following repletion on 5/23. - Replete as needed - s/p Lasix 40mg  IV  Best practice:  Diet: Regular Pain/Anxiety/Delirium protocol (if indicated): n/a VAP protocol (if indicated): n/a DVT prophylaxis: Lovenox, SCDs GI prophylaxis: protonix  Glucose control: monitor  Mobility: BR  Code Status: Full  Family Communication: pt updated at bedside Disposition: ICU    Labs  CBC: Recent Labs  Lab 12/24/19 1349 12/24/19 1417  WBC  --  5.0  NEUTROABS  --  2.7  HGB 13.9 13.0  HCT 41.0 42.6  MCV  --  84.4  PLT  --  283    Basic Metabolic Panel: Recent Labs  Lab 12/24/19 1339 12/24/19 1349 12/24/19 1849 12/25/19 0719  NA 139 136 139 140  K 5.8* 5.7* 3.9 3.6  CL 104 106  103 99  CO2 26  --  25 28  GLUCOSE 101* 94 124* 120*  BUN 11 15 9 10   CREATININE 1.01 1.00 0.90 0.94  CALCIUM 8.8*  --  9.3 9.4  MG  --   --  1.7  --    GFR: Estimated Creatinine Clearance: 84.8 mL/min (by C-G formula based on SCr of 0.94 mg/dL). Recent Labs  Lab 12/24/19 1417  WBC 5.0    Liver Function Tests: Recent Labs  Lab 12/24/19 1339  AST 45*  ALT 15  ALKPHOS 49  BILITOT 1.6*  PROT 6.6  ALBUMIN 3.3*   No results for input(s): LIPASE, AMYLASE in the last 168 hours. No results for input(s): AMMONIA in the last 168 hours.  ABG    Component Value Date/Time   TCO2 27 12/24/2019 1349     Coagulation Profile: No results for input(s): INR, PROTIME in the last 168 hours.  Cardiac Enzymes: No results for input(s): CKTOTAL, CKMB, CKMBINDEX, TROPONINI in the last 168 hours.  HbA1C: Hgb A1c MFr Bld  Date/Time Value Ref Range Status  12/25/2019 07:19 AM 6.0 (H) 4.8 - 5.6 % Final    Comment:    (NOTE) Pre diabetes:          5.7%-6.4% Diabetes:              >6.4% Glycemic control for   <7.0% adults with diabetes   11/07/2014 02:23 AM 6.5 (H) 4.8 - 5.6 % Final    Comment:    (NOTE)         Pre-diabetes: 5.7 - 6.4         Diabetes: >6.4         Glycemic control for adults with diabetes: <7.0     CBG: Recent Labs  Lab 12/24/19 1338  GLUCAP 88    Past Medical History  He,  has a past medical history of Blood clot in vein, Cataract, Hypertension, and Hypertensive retinopathy.   Surgical History   History reviewed. No pertinent surgical history.   Social History   reports that he has never smoked. He has never used smokeless tobacco. He reports that he does not drink alcohol or use drugs.   Family History   His family history includes Colon cancer in his father; Dementia in his mother; Diabetes in his brother; Stroke in his sister.   Allergies Allergies  Allergen Reactions  . Aspirin     When takes a lot of aspirin-- reaction unknown     Home  Medications  Prior to Admission medications   Medication Sig Start Date End Date Taking? Authorizing Provider  amLODipine (NORVASC) 10 MG tablet Take 1 tablet (10 mg total) by mouth daily. Patient not taking: Reported on 12/24/2019 11/06/18   Pattricia Boss, MD  aspirin EC 81 MG tablet Take 1 tablet (81 mg total) by mouth daily. Patient not taking: Reported on 12/24/2019 11/08/14   Domenic Polite, MD  hydrochlorothiazide (HYDRODIURIL) 25 MG tablet Take 1 tablet (25 mg total) by mouth daily. Patient not taking: Reported on 12/24/2019  11/08/14   Zannie Cove, MD  oxyCODONE-acetaminophen (PERCOCET/ROXICET) 5-325 MG per tablet Take 1 tablet by mouth every 4 (four) hours as needed for moderate pain. Patient not taking: Reported on 12/24/2019 11/08/14   Zannie Cove, MD     Critical care time:

## 2019-12-25 NOTE — Progress Notes (Signed)
STROKE TEAM PROGRESS NOTE   INTERVAL HISTORY Patient is sitting up in bed.  He has dysarthria but can be understood with some difficulty.  He has mild left-sided dysmetria.  Blood pressure controlled on Cleviprex drip.  No neurological changes.  Vitals:   12/25/19 0730 12/25/19 0745 12/25/19 0800 12/25/19 0900  BP: (!) 171/76 (!) 186/78 (!) 175/70 (!) 176/86  Pulse: 62 68 65 81  Resp: 19 13  18   Temp:      TempSrc:      SpO2: 98% 96%  90%  Weight:      Height:        CBC:  Recent Labs  Lab 12/24/19 1349 12/24/19 1417  WBC  --  5.0  NEUTROABS  --  2.7  HGB 13.9 13.0  HCT 41.0 42.6  MCV  --  84.4  PLT  --  179    Basic Metabolic Panel:  Recent Labs  Lab 12/24/19 1849 12/25/19 0719  NA 139 140  K 3.9 3.6  CL 103 99  CO2 25 28  GLUCOSE 124* 120*  BUN 9 10  CREATININE 0.90 0.94  CALCIUM 9.3 9.4  MG 1.7  --    Lipid Panel:     Component Value Date/Time   CHOL 202 (H) 12/25/2019 0719   TRIG 51 12/25/2019 0719   HDL 64 12/25/2019 0719   CHOLHDL 3.2 12/25/2019 0719   VLDL 10 12/25/2019 0719   LDLCALC 128 (H) 12/25/2019 0719   HgbA1c:  Lab Results  Component Value Date   HGBA1C 6.0 (H) 12/25/2019   Urine Drug Screen:     Component Value Date/Time   LABOPIA NONE DETECTED 12/24/2019 1339   COCAINSCRNUR NONE DETECTED 12/24/2019 1339   LABBENZ NONE DETECTED 12/24/2019 1339   AMPHETMU NONE DETECTED 12/24/2019 1339   THCU NONE DETECTED 12/24/2019 1339   LABBARB NONE DETECTED 12/24/2019 1339    Alcohol Level     Component Value Date/Time   ETH <10 12/24/2019 1339    IMAGING past 24 hours CT ANGIO HEAD W OR WO CONTRAST  Result Date: 12/24/2019 CLINICAL DATA:  69 year old male with 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 EXAM: CT ANGIOGRAPHY HEAD AND NECK TECHNIQUE: Multidetector CT imaging of the head and neck was performed using the standard protocol during bolus administration of intravenous  contrast. Multiplanar CT image reconstructions and MIPs were obtained to evaluate the vascular anatomy. Carotid stenosis measurements (when applicable) are obtained utilizing NASCET criteria, using the distal internal carotid diameter as the denominator. CONTRAST:  41mL OMNIPAQUE IOHEXOL 350 MG/ML SOLN COMPARISON:  Head CT and MRI earlier today. FINDINGS: CTA NECK Skeleton: Advanced cervical spine disc and endplate degeneration with reversal of lordosis. Carious posterior right maxillary dentition. No acute osseous abnormality identified. Upper chest: Negative. Other neck: No acute findings. Aortic arch: 3 vessel arch configuration with mild for age arch atherosclerosis. Right carotid system: Tortuous brachiocephalic artery without plaque or stenosis. Normal proximal right CCA. There is low-density soft plaque within the medial right CCA at the level of the thyroid cartilage on series 5, image 126, but no significant stenosis. Capacious right carotid bifurcation with minimal calcified plaque of right bulb. Tortuous right ICA just below the skull base without stenosis. Left carotid system: Subtle soft plaque in the left CCA which is mildly tortuous. Mild calcified plaque in the posterior left ICA bulb without stenosis. Similar left ICA tortuosity at C1-C2. Vertebral arteries: Mildly tortuous proximal right subclavian  artery without plaque or stenosis. Normal right vertebral artery origin. The right vertebral is patent to the skull base without plaque or stenosis. No proximal left subclavian artery plaque or stenosis. Normal left vertebral artery origin. Codominant left vertebral is patent to the skull base without plaque or stenosis. CTA HEAD Posterior circulation: Codominant V4 segments with minimal calcified plaque. No stenosis on the left. There is mild right V4 segment stenosis (series 7, image 145) which may be related to soft plaque. Both PICA origins remain patent. Patent vertebrobasilar junction. Patent and  normal proximal and mid basilar artery. However, the distal basilar tapers, with patent but irregular basilar tip, highly irregular bilateral SCA and PCA origins (series 11, image 22). There is at least partial patency of the right SCA visible on series 8, image 102, but all 4 basilar artery branches in this region are highly irregular. The P2 and P3 segments appear more normal, although remain moderately irregular. No PCA branch occlusion is identified. Anterior circulation: Both ICA siphons are patent with mild ectasia, minimal calcified plaque and no stenosis. Normal ophthalmic artery origins. Posterior communicating arteries are diminutive or absent. Patent carotid termini but severe irregularity and stenosis at both ACA origins rim anise int of that at the basilar tip (series 11, image 19). Both MCA origins are more normal. Bilateral ACAs are patent but diminutive and irregular. There is moderate to severe irregularity and stenosis of mid and distal A2 segments (series 12, image 20). Mildly irregular left MCA M1 without stenosis. Patent left MCA bifurcation. Mildly irregular left MCA branches. Tapered and irregular right MCA M1 which becomes moderately irregular and stenotic at the right MCA bifurcation (series 11, image 17). However, the major right MCA branches remain patent although moderately irregular throughout (series 12, image 13). Venous sinuses: Early contrast timing, not well evaluated. Anatomic variants: None. Review of the MIP images confirms the above findings IMPRESSION: 1. 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 2. But negative for any complete occlusion; and the Right SCA remains at least partially patent. 3. Also widespread irregularity and stenoses of the second order intracranial branches, including moderate stenosis at the Right MCA bifurcation. 4. The above is favored due to advanced  intracranial atherosclerosis, despite only minimal/mild atherosclerosis in the neck. There is no significant carotid or vertebral artery stenosis. Salient findings were communicated to Dr. Otelia Limes at 8:16 pm on 12/24/2019 by text page via the [facility] messaging system. Electronically Signed   By: Odessa Fleming M.D.   On: 12/24/2019 20:16   CT Head Wo Contrast  Result Date: 12/24/2019 CLINICAL DATA:  Focal neuro deficit.  Aphasia.  Stroke suspected. EXAM: CT HEAD WITHOUT CONTRAST TECHNIQUE: Contiguous axial images were obtained from the base of the skull through the vertex without intravenous contrast. COMPARISON:  None. FINDINGS: Brain: There is no evidence of acute intracranial hemorrhage, mass lesion, brain edema or extra-axial fluid collection. There is atrophy with diffuse prominence of the ventricles and subarachnoid spaces. Cavum septum pellucidum and vergae noted incidentally. Multifocal encephalomalacia is present, most notably in the left occipital lobe and in both cerebellar hemispheres. The right cerebellar component is slightly less distinct and could be a late subacute infarct. Old lacunar infarcts are present within the right lentiform nuclei and pons. There are chronic small vessel ischemic changes in the periventricular white matter. There is no CT evidence of acute cortical infarction. Vascular: Intracranial vascular calcifications.  No hyperdense vessel identified. Skull: Negative for fracture or suspicious focal lesion. Sinuses/Orbits: Left maxillary sinus polyp or mucous retention cysts. The visualized paranasal sinuses, mastoid air cells and middle ears are otherwise clear. No significant orbital findings. Other: None. IMPRESSION: 1. No CT evidence of acute cortical infarction or hemorrhage. 2. Multifocal encephalomalacia as described. Right cerebellar component could reflect a late subacute infarct. 3. Atrophy and chronic small vessel ischemic changes. Electronically Signed   By: Carey BullocksWilliam  Veazey M.D.    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 EXAM: CT ANGIOGRAPHY HEAD AND NECK TECHNIQUE: Multidetector CT imaging of the head and neck was performed using the standard protocol during bolus administration of intravenous contrast. Multiplanar CT image reconstructions and MIPs were obtained to evaluate the vascular anatomy. Carotid stenosis measurements (when applicable) are obtained utilizing NASCET criteria, using the distal internal carotid diameter as the denominator. CONTRAST:  75mL OMNIPAQUE IOHEXOL 350 MG/ML SOLN COMPARISON:  Head CT and MRI earlier today. FINDINGS: CTA NECK Skeleton: Advanced cervical spine disc and endplate degeneration with reversal of lordosis. Carious posterior right maxillary dentition. No acute osseous abnormality identified. Upper chest: Negative. Other neck: No acute findings. Aortic arch: 3 vessel arch configuration with mild for age arch atherosclerosis. Right carotid system: Tortuous brachiocephalic artery without plaque or stenosis. Normal proximal right CCA. There is low-density soft plaque within the medial right CCA at the level of the thyroid cartilage on series 5, image 126, but no significant stenosis. Capacious right carotid bifurcation with minimal calcified plaque of right bulb. Tortuous right ICA just below the skull base without stenosis. Left carotid system: Subtle soft plaque in the left CCA which is mildly tortuous. Mild calcified plaque in the posterior left ICA bulb without stenosis. Similar left ICA tortuosity at C1-C2. Vertebral arteries: Mildly tortuous proximal right subclavian artery without plaque or stenosis. Normal right vertebral artery origin. The right vertebral is patent to the skull base without plaque or stenosis. No proximal left subclavian artery plaque or stenosis. Normal left  vertebral artery origin. Codominant left vertebral is patent to the skull base without plaque or stenosis. CTA HEAD Posterior circulation: Codominant V4 segments with minimal calcified plaque. No stenosis on the left. There is mild right V4 segment stenosis (series 7, image 145) which may be related to soft plaque. Both PICA origins remain patent. Patent vertebrobasilar junction. Patent and normal proximal and mid basilar artery. However, the distal basilar tapers, with patent but irregular basilar tip, highly irregular bilateral SCA and PCA origins (series 11, image 22). There is at least partial patency of the right SCA visible on series 8, image 102, but all 4 basilar artery branches in this region are highly irregular. The P2 and P3 segments appear more normal, although remain moderately irregular. No PCA branch occlusion is identified. Anterior circulation: Both ICA siphons are patent with mild ectasia, minimal calcified plaque and no stenosis. Normal ophthalmic artery origins. Posterior communicating arteries are diminutive or absent. Patent carotid termini but severe irregularity and stenosis at both ACA origins rim anise int of that at the basilar tip (series 11, image 19). Both MCA origins are more normal. Bilateral ACAs are patent but diminutive and irregular. There is moderate to severe irregularity and stenosis of mid and distal A2 segments (series 12, image 20). Mildly irregular left MCA M1 without stenosis.  Patent left MCA bifurcation. Mildly irregular left MCA branches. Tapered and irregular right MCA M1 which becomes moderately irregular and stenotic at the right MCA bifurcation (series 11, image 17). However, the major right MCA branches remain patent although moderately irregular throughout (series 12, image 13). Venous sinuses: Early contrast timing, not well evaluated. Anatomic variants: None. Review of the MIP images confirms the above findings IMPRESSION: 1. Positive for moderate and severe  irregularity and stenosis at the Basilar Artery tip, the bilateral PCA and SCA origins, and also both ACA origins. 2. But negative for any complete occlusion; and the Right SCA remains at least partially patent. 3. Also widespread irregularity and stenoses of the second order intracranial branches, including moderate stenosis at the Right MCA bifurcation. 4. The above is favored due to advanced intracranial atherosclerosis, despite only minimal/mild atherosclerosis in the neck. There is no significant carotid or vertebral artery stenosis. Salient findings were communicated to Dr. Otelia Limes at 8:16 pm on 12/24/2019 by text page via the [facility] messaging system. Electronically Signed   By: Odessa Fleming M.D.   On: 12/24/2019 20:16   MR BRAIN WO CONTRAST  Result Date: 12/24/2019 CLINICAL DATA:  69 year old male with aphasia, neurologic deficit. EXAM: MRI HEAD WITHOUT CONTRAST TECHNIQUE: Multiplanar, multiecho pulse sequences of the brain and surrounding structures were obtained without intravenous contrast. COMPARISON:  Head CT 1255 hours today. FINDINGS: Brain: Patchy and confluent restricted diffusion in the right superior cerebellar artery territory (series 11, image 50). Associated T2 and FLAIR hyperintensity, T1 hypointensity, no evidence of acute hemorrhage. No posterior fossa mass effect. Superimposed chronic infarcts in the contralateral left cerebellum, bilateral pons, bilateral deep gray nuclei, bilateral corona radiata. Chronic cortical encephalomalacia in the left parietal and occipital lobes. Occasional chronic micro hemorrhages including in the left brainstem. No midline shift, mass effect, evidence of mass lesion, ventriculomegaly, extra-axial collection or acute intracranial hemorrhage. Cervicomedullary junction and pituitary are within normal limits. Cavum septum pellucidum (normal variant). Vascular: Major intracranial vascular flow voids are preserved. Generalized intracranial artery tortuosity. Skull and  upper cervical spine: Partially visible cervical spine degeneration with evidence of mild degenerative spinal stenosis at C3-C4 (series 13, image 13). Visualized bone marrow signal is within normal limits. Sinuses/Orbits: Negative orbits. Mild maxillary sinus mucous retention cysts. Other: Mastoids are clear. Visible internal auditory structures appear normal. Scalp and face soft tissues appear negative. IMPRESSION: 1. 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 No associated hemorrhage or mass effect. 2. Underlying advanced chronic ischemic disease. 3. Partially visible cervical spine degeneration with mild spinal stenosis at C3-C4. Electronically Signed   By: Odessa Fleming M.D.   On: 12/24/2019 16:58   DG Chest Portable 1 View  Result Date: 12/24/2019 CLINICAL DATA:  Hypertension.  Slurred speech. EXAM: PORTABLE CHEST 1 VIEW COMPARISON:  None. FINDINGS: Assessment heart size is limited due to portable technique. The heart may be borderline to mildly enlarged. The hila, mediastinum, lungs, and pleura are otherwise unremarkable. IMPRESSION: No acute abnormalities. Electronically Signed   By: Gerome Sam III M.D   On: 12/24/2019 13:29    PHYSICAL EXAM Pleasant middle-aged African-American male not in distress. . Afebrile. Head is nontraumatic. Neck is supple without bruit.    Cardiac exam no murmur or gallop. Lungs are clear to auscultation. Distal pulses are well felt. Neurological Exam :  Awake alert oriented to time place and person.  Moderate dysarthria but can be understood with some difficulty.  Extraocular movements  are full range without nystagmus but has saccadic dysmetria on lateral gaze.  Pupils equal reactive.  Fundi not visualized.  Face is symmetric without weakness.  Tongue midline.  Motor system exam shows no upper or lower extremity drift but mild finger-to-nose and knee to heel dysmetria on the left.  Sensation  intact bilaterally.  Deep tendon flexes symmetric.  Plantars downgoing.  Gait not tested.  ASSESSMENT/PLAN Mr. Brandon Perkins is a 70 y.o. male with history of HTN, hypertensive retinopathy, "left arm vein blood clot" and GI bleed presenting with slurred speech, weakness, gait unsteadiness, dizziness and confusion with SBP > 240.  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 Small vessel disease. Atrophy.   MRI  R superior cerebellar artery territory infarct. Small vessel disease. Cervical spinal degernation w/ mild stenosis C3-4.   CTA head & neck moderate and severe stenosis BA tip, B PCA, B ACA and SCA origins. R SCA partially patent. Advanced intracranial atherosclerosis, mild atherosclerosis in the neck  2D Echo pending  LDL 128  HgbA1c 6.0  Lovenox 40 mg sq daily for VTE prophylaxis  Not taking prescribed aspirin 81 mg daily prior to admission, now on aspirin 81 mg daily. Add plavix. Continue DAPT x 3 months then aspirin alone   Therapy recommendations:  CIR - order placed  Disposition:  pending   Hypertensive Urgency  SBP > 240 on arrival  Home meds:  Amlodipine 10, HCTZ 25 - > pt reported he was not taking  BP now 170s  Currently on cleviprex, wean as able - CCM managing  labetolol prn  On amlodipine 10, lasix 40 -> changed to amlodipine 10 and chlorthalidone 25  . Permissive hypertension (OK if < 220/120) but gradually normalize in 5-7 days . Check orthostatics . 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  Recommend aspirin Plavix for 3 months followed by  aspirin alone.  Aggressive risk factor modification.  Mild permissive hypertension and bring blood pressure down gradually by 15 to 20 %/day but overall he will likely need a baseline systolic blood pressure in the 140-160 range due to his occlusive posterior circulation disease hence would not normalize the blood pressure or bring it down too quickly.  Mobilize out of bed.  Therapy consults.  Wean off Cleviprex drip and use oral blood pressure medications and as needed IV labetalol or hydralazine.  Add statin and oral blood pressure medications that patient will be able to afford.  Long discussion with patient and with Dr. Lynnell Catalan critical care medicine. This patient is critically ill and at significant risk of neurological worsening, death and care requires constant monitoring of vital signs, hemodynamics,respiratory and cardiac monitoring, extensive review of multiple databases, frequent neurological assessment, discussion with family, other specialists and medical decision making of high complexity.I have made any additions or clarifications directly to the above note.This critical care time does not reflect procedure time, or teaching time or supervisory time of PA/NP/Med Resident etc but could involve care discussion time.  I spent 30 minutes of neurocritical care time  in the  care of  this patient.    Antony Contras, MD  To contact Stroke Continuity provider, please refer to [email or web address] After hours, contact General Neurology

## 2019-12-25 NOTE — Evaluation (Signed)
Speech Language Pathology Evaluation Patient Details Name: Brandon Perkins MRN: [id number] DOB: 1950-08-05 Today's Date: 12/25/2019 Time: 2706-2376 SLP Time Calculation (min) (ACUTE ONLY): 27 min  Problem List:  Patient Active Problem List   Diagnosis Date Noted  . Hypertensive emergency 12/24/2019  . Hypokalemia 11/07/2014  . Hypertensive urgency 11/07/2014  . Elevated blood pressure 11/06/2014  . Syncope, near 11/06/2014  . GI bleeding 11/06/2014  . Hand laceration 11/06/2014   Past Medical History:  Past Medical History:  Diagnosis Date  . Blood clot in vein    left arm vein blood clot, not know the detail, treated with ASA per pt  . Cataract    OU  . Hypertension   . Hypertensive retinopathy    OU   Past Surgical History: History reviewed. No pertinent surgical history. HPI:  Pt is a 69 yo male presenting with R sided weakness with aphasia and slurred speech. 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 PMH: HTN, HTN retinopathy, GIB   Assessment / Plan / Recommendation Clinical Impression  Pt presents with a moderate dysarthria characterized by imprecise articulation, reduced coordination of articulators, and hypophonia. There is a mild amount of saliva that pools in his oral cavity, also impacting speech at times but primarily contained within his oral cavity with anterior loss noted x1. He also scored a 17/30 on the MMSE, indicative of cognitive impairment. Pt reports this to be an acute change, as he typically lives alone and independently. On testing today he exhibited difficulty in the areas of delayed recall, selective attention, problem solving, and processing/comprehension, particularly with multistep instructions. He will benefit from SLP f/u acutely and at next level of care.     SLP Assessment  SLP Recommendation/Assessment: Patient needs continued Speech Lanaguage Pathology Services SLP Visit Diagnosis: Dysarthria and  anarthria (R47.1);Cognitive communication deficit (R41.841)    Follow Up Recommendations  24 hour supervision/assistance;Other (comment)(SLP f/u at next level of care)    Frequency and Duration min 2x/week  2 weeks      SLP Evaluation Cognition  Overall Cognitive Status: Impaired/Different from baseline Arousal/Alertness: Awake/alert Orientation Level: Oriented X4 Attention: Selective Selective Attention: Impaired Selective Attention Impairment: Verbal basic Memory: Impaired Memory Impairment: Retrieval deficit;Decreased recall of new information Awareness: Appears intact(intellectual awareness appears to be intact) Problem Solving: Impaired Problem Solving Impairment: Verbal complex;Verbal basic       Comprehension  Auditory Comprehension Overall Auditory Comprehension: Impaired Commands: Impaired Multistep Basic Commands: 50-74% accurate Reading Comprehension Reading Status: Within funtional limits    Expression Expression Primary Mode of Expression: Verbal Verbal Expression Overall Verbal Expression: Appears within functional limits for tasks assessed(basic expression - need to assess more as speech improves)   Oral / Motor  Oral Motor/Sensory Function Overall Oral Motor/Sensory Function: Mild impairment Facial ROM: Within Functional Limits Facial Symmetry: Within Functional Limits Facial Strength: Within Functional Limits Facial Sensation: Reduced right Lingual ROM: Within Functional Limits Lingual Symmetry: Within Functional Limits Lingual Strength: Within Functional Limits Velum: Within Functional Limits Mandible: Within Functional Limits Motor Speech Overall Motor Speech: Impaired Respiration: Within functional limits Phonation: Low vocal intensity Resonance: Within functional limits Articulation: Impaired Level of Impairment: Word Intelligibility: Intelligibility reduced Word: 50-74% accurate Phrase: 25-49% accurate Sentence: 25-49% accurate Motor  Planning: Witnin functional limits Effective Techniques: Increased vocal intensity   GO                    Osie Bond., M.A. [facility] Environmental education officer [phone]  Office [phone]  12/25/2019, 9:46 AM

## 2019-12-26 ENCOUNTER — Inpatient Hospital Stay (HOSPITAL_COMMUNITY): Payer: Medicare Other

## 2019-12-26 DIAGNOSIS — I351 Nonrheumatic aortic (valve) insufficiency: Secondary | ICD-10-CM

## 2019-12-26 DIAGNOSIS — I639 Cerebral infarction, unspecified: Secondary | ICD-10-CM

## 2019-12-26 LAB — BASIC METABOLIC PANEL
Anion gap: 10 (ref 5–15)
BUN: 22 mg/dL (ref 8–23)
CO2: 25 mmol/L (ref 22–32)
Calcium: 9.3 mg/dL (ref 8.9–10.3)
Chloride: 104 mmol/L (ref 98–111)
Creatinine, Ser: 1.24 mg/dL (ref 0.61–1.24)
GFR calc Af Amer: >60 mL/min (ref >60)
GFR calc non Af Amer: 60 mL/min — ABNORMAL LOW (ref >60)
Glucose, Bld: 106 mg/dL — ABNORMAL HIGH (ref 70–99)
Potassium: 4 mmol/L (ref 3.5–5.1)
Sodium: 139 mmol/L (ref 135–145)

## 2019-12-26 LAB — ECHOCARDIOGRAM COMPLETE
Height: 74 in
Weight: 2772.5 oz

## 2019-12-26 LAB — MAGNESIUM: Magnesium: 1.8 mg/dL (ref 1.7–2.4)

## 2019-12-26 NOTE — H&P (Signed)
Physical Medicine and Rehabilitation Admission H&P    Chief Complaint  Patient presents with  . Aphasia  : HPI: Brandon Perkins is a 69 year old right-handed male with history of hypertension.  History taken form chart review and patient due to cognition. Patient lives alone independent with assistive device.  1 level home 3 steps to entry.  He presented on 12/24/19 with dysarthria and AMS. Elevated blood pressure systolics in the 200s and placed on Cleviprex.  Cranial CT scan unremarkable for acute intracranial process. 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  Admission chemistries with potassium 5.8, alcohol negative, SARS coronavirus negative, urinalysis negative nitrite.  Echocardiogram with EF of 60%, grade 1 diastolic dysfunction. Maintain on aspirin and Plavix for CVA prophylaxis x3 months then aspirin alone.  Subcutaneous Lovenox for DVT prophylaxis.  Tolerating a regular diet.  Therapy evaluations completed and patient was admitted for a comprehensive rehab program. Please see preadmission assessment from earlier today as well.   Review of Systems  Constitutional: Negative for chills and fever.  HENT: Negative for hearing loss.   Eyes: Negative for blurred vision and double vision.  Respiratory: Negative for cough and shortness of breath.   Cardiovascular: Negative for chest pain, palpitations and leg swelling.  Gastrointestinal: Positive for constipation. Negative for heartburn, nausea and vomiting.  Genitourinary: Negative for dysuria, flank pain and hematuria.  Musculoskeletal: Positive for myalgias.  Skin: Negative for rash.  Neurological: Positive for speech change. Negative for focal weakness.  All other systems reviewed and are negative.  Past Medical History:   Diagnosis Date  . Blood clot in vein    left arm vein blood clot, not know the detail, treated with ASA per pt  . Cataract    OU  . Hypertension   . Hypertensive retinopathy    OU   History reviewed. No pertinent cranial surgical history. Family History  Problem Relation Age of Onset  . Dementia Mother   . Colon cancer Father   . Stroke Sister        Sister had brain aneurysm  . Diabetes Brother    Social History:  reports that he has never smoked. He has never used smokeless tobacco. He reports that he does not drink alcohol or use drugs. Allergies:  Allergies  Allergen Reactions  . Aspirin     When takes a lot of aspirin-- reaction unknown   Medications Prior to Admission  Medication Sig Dispense Refill  . hydrochlorothiazide (HYDRODIURIL) 25 MG tablet Take 1 tablet (25 mg total) by mouth daily. (Patient not taking: Reported on 12/24/2019) 30 tablet 0  . oxyCODONE-acetaminophen (PERCOCET/ROXICET) 5-325 MG per tablet Take 1 tablet by mouth every 4 (four) hours as needed for moderate pain. (Patient not taking: Reported on 12/24/2019) 15 tablet 0  . [DISCONTINUED] amLODipine (NORVASC) 10 MG tablet Take 1 tablet (10 mg total) by mouth daily. (Patient not taking: Reported on 12/24/2019) 30 tablet 0  . [DISCONTINUED] aspirin EC 81 MG tablet Take 1 tablet (81 mg total) by mouth daily. (Patient not taking: Reported on 12/24/2019)      Drug Regimen Review Drug regimen was reviewed and remains appropriate with no significant issues identified  Home: Home Living Family/patient expects to be discharged to:: Private residence Living Arrangements: Alone Available Help at Discharge: Family Type of  Home: House Home Access: Stairs to enter [employer] of Steps: 3 Entrance Stairs-Rails: Right Home Layout: One level Bathroom Shower/Tub: Chiropodist: Standard Home Equipment: Radio producer - single point  Lives With: Alone   Functional History: Prior  Function Level of Independence: Independent with assistive device(s) Comments: pt reports using SPC for mobility tasks, reports friend typically assists with driving/getting groceries (reports he doesn't like going to the store). Was performing iADL tasks at home   Functional Status:  Mobility: Bed Mobility Overal bed mobility: Needs Assistance Bed Mobility: Supine to Sit Supine to sit: Min guard, HOB elevated General bed mobility comments: pt OOB in chair upon arrival  Transfers Overall transfer level: Needs assistance Equipment used: Straight cane Transfers: Sit to/from Stand Sit to Stand: Min assist General transfer comment: for safety/balance Ambulation/Gait Ambulation/Gait assistance: Min assist Gait Distance (Feet): (100 ft X 2 with standing break) Assistive device: Straight cane, Rolling walker (2 wheeled) Gait Pattern/deviations: Step-to pattern, Step-through pattern, Shuffle, Wide base of support General Gait Details: initially ambulating in room with SPC and then use of RW due to instability; cues for safe use of AD and assistance for balance especially with turning  Gait velocity: decreased    ADL: ADL Overall ADL's : Needs assistance/impaired Eating/Feeding: NPO Grooming: Wash/dry hands, Wash/dry face, Standing, Min guard Upper Body Bathing: Set up, Min guard, Sitting Lower Body Bathing: Minimal assistance, +2 for safety/equipment, Sit to/from stand Upper Body Dressing : Set up, Min guard, Sitting Lower Body Dressing: Min guard, Sit to/from stand Lower Body Dressing Details (indicate cue type and reason): pt was able to doff/donn socks seated in recliner via figure 4; min A-guard sit to stand  Toilet Transfer: Minimal assistance, Ambulation, RW Toilet Transfer Details (indicate cue type and reason): simulated via transfer to recliner Toileting- Clothing Manipulation and Hygiene: Minimal assistance, Sit to/from stand, Sitting/lateral lean Functional mobility during  ADLs: Minimal assistance, Rolling walker General ADL Comments: pt with decreased UE coordination  Cognition: Cognition Overall Cognitive Status: Impaired/Different from baseline Arousal/Alertness: Awake/alert Orientation Level: Oriented to person Attention: Selective Selective Attention: Impaired Selective Attention Impairment: Verbal basic Memory: Impaired Memory Impairment: Retrieval deficit, Decreased recall of new information Awareness: Appears intact(intellectual awareness appears to be intact) Problem Solving: Impaired Problem Solving Impairment: Verbal complex, Verbal basic Cognition Arousal/Alertness: Awake/alert Behavior During Therapy: WFL for tasks assessed/performed, Flat affect Overall Cognitive Status: Impaired/Different from baseline Area of Impairment: Awareness, Problem solving, Attention Orientation Level: Disoriented to, Time(" 2019") Current Attention Level: Selective Following Commands: Follows multi-step commands consistently Awareness: Emergent Problem Solving: Decreased initiation, Requires verbal cues, Slow processing General Comments: Pt soft spoken  Physical Exam: Blood pressure (!) (P) 154/76, pulse (!) (P) 54, temperature (P) 97.7 F (36.5 C), temperature source (P) Oral, resp. rate (P) 18, height 6\' 2"  (1.88 m), weight 78.6 kg, SpO2 (P) 100 %. Physical Exam  Vitals reviewed. Constitutional: He appears well-developed and well-nourished.  HENT:  Head: Normocephalic and atraumatic.  Eyes: EOM are normal. Right eye exhibits no discharge. Left eye exhibits no discharge.  Neck: No tracheal deviation present. No thyromegaly present.  Respiratory: Effort normal. No stridor. No respiratory distress.  GI: Soft. He exhibits no distension.  Musculoskeletal:     Comments: No edema or tenderness in extremities  Neurological: He is alert.  Alert Makes eye contact with examiner. Follows commands. Provides name,age and DOB. Dysarthria Motor: Grossly 5/5  throughout Mild LUE >RUE dysmetria Sensation intact to light touch  Skin: Skin is warm and dry.  Psychiatric: His affect is blunt. His speech is slurred.  Tangential at times    No results found for this or any previous visit (from the past 48 hour(s)). No results found.     Medical Problem List and Plan: 1.  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  Antithrombotics: -DVT/anticoagulation: Lovenox  -antiplatelet therapy: Aspirin Plavix x3 months then aspirin alone 3. Pain Management: Tylenol as needed  Monitor for headaches. 4. Mood: Provide emotional support  -antipsychotic agents: Seroquel 12.5 mg twice daily 5. Neuropsych: This patient is ?fully capable of making decisions on his own behalf. 6. Skin/Wound Care: Routine skin checks 7. Fluids/Electrolytes/Nutrition: Routine in and outs. CMP ordered.  8.  Hypertension.  Hygroton 25 mg daily, Norvasc 10 mg daily lisinopril 10 mg daily.    Monitor with increased mobility.  9.  Hyperlipidemia. Lipitor  Mcarthur Rossetti Angiulli, PA-C 12/29/2019  I have personally performed a face to face diagnostic evaluation, including, but not limited to relevant history and physical exam findings, of this patient and developed relevant assessment and plan.  Additionally, I have reviewed and concur with the physician assistant's documentation above.  Maryla Morrow, MD, ABPMR

## 2019-12-26 NOTE — Progress Notes (Signed)
NAME:  Brandon Perkins, MRN:  [id number], DOB:  09/08/1950, LOS: 2 ADMISSION DATE:  12/24/2019, CONSULTATION DATE:  12/24/2019 REFERRING MD:  Dr. Regenia Skeeter - EM, CHIEF COMPLAINT:  Slurred speech   Brief History   69 yo male with PMHx of poorly-controlled HTN, GIB, HTN retinopathy, unspecified "arm clot" who presented to UC with slurred speech, weakness, confusion. At UC, SBP > 200, referred to ED, and found to have SBP >240. 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 As of 5/25, patient is clinically improved and stable and appropriate for transfer to floor.  Past Medical History  HTN, poorly controlled, HTN urgency  HTN retinopathy GIB Near syncope "Left arm vein blood clot"  Significant Hospital Events   5/23-  referred to ED from UC with CVA-like sx and HTN emergency. Acute sup cerebellar artery infarct on MRI, admitted to CCM. 5/25 - Clevidipine stopped and transitioned to PO antihypertensives. Pt appears clinically  improved and less dysarthric. Will facilitate transfer to floor.  Consults:  Neurology  Procedures:  N/A  Significant Diagnostic Tests:  5/23 CT H > chronic small vessel ischemic changes. Multifocal encephalomalacia. No acute infarct or hemorrhage seen.   5/23 MRI Brain >> 1. 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 No associated hemorrhage or mass effect. 2. Underlying advanced chronic ischemic disease. 3. Partially visible cervical spine degeneration with mild spinal stenosis at C3-C4.  5/23 CT Angio Neck > 1. 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 2. But negative for any complete occlusion; and the Right SCA remains at least partially patent. 3. Also widespread irregularity and stenoses of the second order intracranial branches, including moderate stenosis  at the Right MCA bifurcation. 4. The above is favored due to advanced intracranial atherosclerosis, despite only minimal/mild atherosclerosis in the neck. There is no significant carotid or vertebral artery stenosis.  Micro Data:  5/23 SARS Cov2>>   Antimicrobials:  N/A  Interim history/subjective:  Pt reports that he is doing well today. He is tearful and indicates that he does not want to talk due to spitting that is uncomfortable to him when he talks.   Objective   Blood pressure (!) 178/97, pulse (!) 52, temperature 99 F (37.2 C), temperature source Oral, resp. rate 20, height 6\' 2"  (1.88 m), weight 78.6 kg, SpO2 100 %.        Intake/Output Summary (Last 24 hours) at 12/26/2019 1021 Last data filed at 12/26/2019 0600 Gross per 24 hour  Intake 489.08 ml  Output 750 ml  Net -260.92 ml   Filed Weights   12/24/19 1244 12/24/19 2200  Weight: 81.6 kg 78.6 kg    Examination: General: Adult male sitting upright in bed, no acute distress HEENT: Trachea midline. No lymphadenopathy Lungs: Clear lungs, normal respiratory effort, symmetric chest expansion. On 2L O2 Cardiovascular: RRR, nl S1S2, no m/r/g, cap refill <2s. 2+ peripheral pulses Abdomen: Normal bowel sounds, soft, flat, no rebound or guarding Extremities: Full and symmetric ROM of UE and LE. 5/5 strength throughout. Neuro: AAO x3. Dysarthria improved but some persistent slurring and gargled speech. CN I-XII wnl. Normal tone, 5/5 strength of UE and LE. 2+ reflexes and normal sensation throughout. No pronator drift. Dysmetria evident on finger-to-nose test, normal heel-to-shin test. GU: Clear yellow urine in bag  Resolved Hospital Problem list     Assessment & Plan:  Acute right cerebellar stroke Pt with improvement in dysarthria and preserved strength. Has persistent dysmetria of UE that will require prolonged recovery process. - Neuro following, appreciate recs - Plavix and ASA, for secondary stroke prevention -  Statin - Neuro checks q4h - Telemetry - PT/OT/speech consult - Remove urinary catheter  Hypertensive Emergency SBP ranging 134-158 with ultimate goal of 120-140.   - Clevidipine ggt discontinued 5/25. - Oral amlodipine 10mg  - Oral chlorthalidone 25mg  - Daily orthostatic VS  Fluids and Electrolytes BMP wnl. Ca 9.4 following repletion on 5/23. - Replete as needed - s/p Lasix 40mg  IV  Best practice:  Diet: Regular Pain/Anxiety/Delirium protocol (if indicated): n/a VAP protocol (if indicated): n/a DVT prophylaxis: Lovenox, SCDs GI prophylaxis: protonix  Glucose control: monitor  Mobility: BR  Code Status: Full  Family Communication: pt updated at bedside Disposition: ICU    Labs   CBC: Recent Labs  Lab 12/24/19 1349 12/24/19 1417  WBC  --  5.0  NEUTROABS  --  2.7  HGB 13.9 13.0  HCT 41.0 42.6  MCV  --  84.4  PLT  --  179    Basic Metabolic Panel: Recent Labs  Lab 12/24/19 1339 12/24/19 1349 12/24/19 1849 12/25/19 0719 12/26/19 0233 12/26/19 0715  NA 139 136 139 140  --  139  K 5.8* 5.7* 3.9 3.6  --  4.0  CL 104 106 103 99  --  104  CO2 26  --  25 28  --  25  GLUCOSE 101* 94 124* 120*  --  106*  BUN 11 15 9 10   --  22  CREATININE 1.01 1.00 0.90 0.94  --  1.24  CALCIUM 8.8*  --  9.3 9.4  --  9.3  MG  --   --  1.7  --  1.8  --    GFR: Estimated Creatinine Clearance: 64.3 mL/min (by C-G formula based on SCr of 1.24 mg/dL). Recent Labs  Lab 12/24/19 1417  WBC 5.0    Liver Function Tests: Recent Labs  Lab 12/24/19 1339  AST 45*  ALT 15  ALKPHOS 49  BILITOT 1.6*  PROT 6.6  ALBUMIN 3.3*   No results for input(s): LIPASE, AMYLASE in the last 168 hours. No results for input(s): AMMONIA in the last 168 hours.  ABG    Component Value Date/Time   TCO2 27 12/24/2019 1349     Coagulation Profile: No results for input(s): INR, PROTIME in the last 168 hours.  Cardiac Enzymes: No results for input(s): CKTOTAL, CKMB, CKMBINDEX, TROPONINI in the  last 168 hours.  HbA1C: Hgb A1c MFr Bld  Date/Time Value Ref Range Status  12/25/2019 07:19 AM 6.0 (H) 4.8 - 5.6 % Final    Comment:    (NOTE) Pre diabetes:          5.7%-6.4% Diabetes:              >6.4% Glycemic control for   <7.0% adults with diabetes   11/07/2014 02:23 AM 6.5 (H) 4.8 - 5.6 % Final    Comment:    (NOTE)         Pre-diabetes: 5.7 - 6.4         Diabetes: >6.4         Glycemic control for adults with diabetes: <7.0     CBG: Recent Labs  Lab 12/24/19 1338 12/25/19 2355  GLUCAP 88 102*    Past Medical History  He,  has a past medical history of Blood clot  in vein, Cataract, Hypertension, and Hypertensive retinopathy.   Surgical History   History reviewed. No pertinent surgical history.   Social History   reports that he has never smoked. He has never used smokeless tobacco. He reports that he does not drink alcohol or use drugs.   Family History   His family history includes Colon cancer in his father; Dementia in his mother; Diabetes in his brother; Stroke in his sister.   Allergies Allergies  Allergen Reactions  . Aspirin     When takes a lot of aspirin-- reaction unknown     Home Medications  Prior to Admission medications   Medication Sig Start Date End Date Taking? Authorizing Provider  amLODipine (NORVASC) 10 MG tablet Take 1 tablet (10 mg total) by mouth daily. Patient not taking: Reported on 12/24/2019 11/06/18   Margarita Grizzle, MD  aspirin EC 81 MG tablet Take 1 tablet (81 mg total) by mouth daily. Patient not taking: Reported on 12/24/2019 11/08/14   Zannie Cove, MD  hydrochlorothiazide (HYDRODIURIL) 25 MG tablet Take 1 tablet (25 mg total) by mouth daily. Patient not taking: Reported on 12/24/2019 11/08/14   Zannie Cove, MD  oxyCODONE-acetaminophen (PERCOCET/ROXICET) 5-325 MG per tablet Take 1 tablet by mouth every 4 (four) hours as needed for moderate pain. Patient not taking: Reported on 12/24/2019 11/08/14   Zannie Cove, MD      Critical care time:

## 2019-12-26 NOTE — Progress Notes (Addendum)
Patient arrived to 3W08 from 4N. Alert and oriented x3. Denies pain. States he wants to sit in chair. POC provided to patient. Call bell within reach. Chair alarm on.

## 2019-12-26 NOTE — Progress Notes (Signed)
STROKE TEAM PROGRESS NOTE   INTERVAL HISTORY Patient is sitting in bedside chair.Marland Kitchen  He has dysarthria but can be understood with some difficulty.  He has mild right sided dysmetria.  Blood pressure controlled now offCleviprex drip.  No neurological changes.  Vitals:   12/26/19 0800 12/26/19 0900 12/26/19 1030 12/26/19 1200  BP: (!) 178/97 (!) 175/91 (!) 180/95   Pulse:  69 66   Resp: 20 14 17    Temp: 99 F (37.2 C)   98.6 F (37 C)  TempSrc: Oral     SpO2:  93% 97%   Weight:      Height:        CBC:  Recent Labs  Lab 12/24/19 1349 12/24/19 1417  WBC  --  5.0  NEUTROABS  --  2.7  HGB 13.9 13.0  HCT 41.0 42.6  MCV  --  84.4  PLT  --  195    Basic Metabolic Panel:  Recent Labs  Lab 12/24/19 1849 12/24/19 1849 12/25/19 0719 12/26/19 0233 12/26/19 0715  NA 139   < > 140  --  139  K 3.9   < > 3.6  --  4.0  CL 103   < > 99  --  104  CO2 25   < > 28  --  25  GLUCOSE 124*   < > 120*  --  106*  BUN 9   < > 10  --  22  CREATININE 0.90   < > 0.94  --  1.24  CALCIUM 9.3   < > 9.4  --  9.3  MG 1.7  --   --  1.8  --    < > = values in this interval not displayed.   Lipid Panel:     Component Value Date/Time   CHOL 202 (H) 12/25/2019 0719   TRIG 51 12/25/2019 0719   HDL 64 12/25/2019 0719   CHOLHDL 3.2 12/25/2019 0719   VLDL 10 12/25/2019 0719   LDLCALC 128 (H) 12/25/2019 0719   HgbA1c:  Lab Results  Component Value Date   HGBA1C 6.0 (H) 12/25/2019   Urine Drug Screen:     Component Value Date/Time   LABOPIA NONE DETECTED 12/24/2019 1339   COCAINSCRNUR NONE DETECTED 12/24/2019 1339   LABBENZ NONE DETECTED 12/24/2019 1339   AMPHETMU NONE DETECTED 12/24/2019 1339   THCU NONE DETECTED 12/24/2019 1339   LABBARB NONE DETECTED 12/24/2019 1339    Alcohol Level     Component Value Date/Time   ETH <10 12/24/2019 1339    IMAGING past 24 hours No results found.  PHYSICAL EXAM Pleasant middle-aged African-American male not in distress. . Afebrile. Head is  nontraumatic. Neck is supple without bruit.    Cardiac exam no murmur or gallop. Lungs are clear to auscultation. Distal pulses are well felt. Neurological Exam :  Awake alert oriented to time place and person.  Moderate dysarthria but can be understood with some difficulty.  Extraocular movements are full range without nystagmus but has saccadic dysmetria on lateral gaze.  Pupils equal reactive.  Fundi not visualized.  Face is symmetric without weakness.  Tongue midline.  Motor system exam shows no upper or lower extremity drift but mild finger-to-nose and knee to heel dysmetria on the right sensation intact bilaterally.  Deep tendon flexes symmetric.  Plantars downgoing.  Gait not tested.  ASSESSMENT/PLAN Brandon Perkins is a 69 y.o. male with history of HTN, hypertensive retinopathy, "left arm vein blood clot" and GI  bleed presenting with slurred speech, weakness, gait unsteadiness, dizziness and confusion with SBP > 240.  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 Small vessel disease. Atrophy.   MRI  R superior cerebellar artery territory infarct. Small vessel disease. Cervical spinal degernation w/ mild stenosis C3-4.   CTA head & neck moderate and severe stenosis BA tip, B PCA, B ACA and SCA origins. R SCA partially patent. Advanced intracranial atherosclerosis, mild atherosclerosis in the neck  2D Echo pending  LDL 128   HgbA1c 6.0  Lovenox 40 mg sq daily for VTE prophylaxis  Not taking prescribed aspirin 81 mg daily prior to admission, now on aspirin 81 mg daily. Add plavix. Continue DAPT x 3 months then aspirin alone   Therapy recommendations:  CIR - order placed  Disposition:  pending   Hypertensive Urgency  SBP > 240 on arrival  Home meds:  Amlodipine 10, HCTZ 25 - > pt reported he was not taking  BP now 170s  Currently on cleviprex, wean as able - CCM managing  labetolol prn  On  amlodipine 10, lasix 40 -> changed to amlodipine 10 and chlorthalidone 25  . Permissive hypertension (OK if < 220/120) but gradually normalize in 5-7 days . Check orthostatics . 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  Recommend aspirin  And Plavix for 3 months followed by aspirin alone.  Aggressive risk factor modification.  Mild permissive hypertension and bring blood pressure down gradually by 15 to 20 %/day but overall he will likely need a baseline systolic blood pressure in the 140-160 range due to his occlusive posterior circulation disease hence would not normalize the blood pressure or bring it down too quickly.  Add lisinopril 10 mg daily.  Mobilize out of bed.  Therapy consults.   Transfer to neurology floor bed.  Add statin and oral blood pressure medications that patient will be able to afford.  Long discussion with patient and with Dr. Lynnell Catalan critical care medicine. Greater than 50% time during this 35-minute visit was spent in counseling and coordination of care and discussion with care team   Brandon Heady, MD  To contact Stroke Continuity provider, please refer to [email or web address]. After hours, contact General Neurology

## 2019-12-26 NOTE — Progress Notes (Addendum)
Physical Therapy Treatment Patient Details Name: Brandon Perkins MRN: [id number] DOB: 1950/08/13 Today's Date: 12/26/2019    History of Present Illness 69 yo male with PMHx of poorly-controlled HTN, GIB, hypertensive retinopathy, cataract, unspecified "arm clot" who presented to UC with slurred speech, weakness, confusion. At UC, SBP > 200, referred to ED, and found to have SBP >240. 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    PT Comments    Patient progressing with mobility, but demonstrated balance deficit to him allowing him to attempt to use SPC.  Needed HHA on L due to fatigue and anterior bias.  Patient with slowly improving awareness and education about rehab options.  Feel he would do well at CIR if able to stay with his son and his family at d/c.  PT to follow.   Orthostatic VS for the past 24 hrs (Last 3 readings):  BP- Sitting Pulse- Sitting BP- Standing at 0 minutes Pulse- Standing at 0 minutes BP- Standing at 3 minutes Pulse- Standing at 3 minutes  12/26/19 1543 168/81 78 (!) 186/92 75 184/86 66    Follow Up Recommendations  CIR     Equipment Recommendations  Rolling walker with 5" wheels    Recommendations for Other Services       Precautions / Restrictions Precautions Precautions: Fall Restrictions Weight Bearing Restrictions: No    Mobility  Bed Mobility               General bed mobility comments: up in chair  Transfers Overall transfer level: Needs assistance Equipment used: Straight cane Transfers: Sit to/from Stand Sit to Stand: Min assist         General transfer comment: cane in R hand pushing from rail  Ambulation/Gait Ambulation/Gait assistance: Min assist Gait Distance (Feet): 150 Feet Assistive device: 1 person hand held assist;Straight cane Gait Pattern/deviations: Step-to pattern;Decreased stride length;Shuffle;Trunk flexed;Wide base of support     General Gait Details: cues for forward gaze, upright posture,  assist for balance after about 100' with cane gave HHA as well due to anterior bias with fatigue   Stairs             Wheelchair Mobility    Modified Rankin (Stroke Patients Only)       Balance Overall balance assessment: Needs assistance Sitting-balance support: Feet supported Sitting balance-Leahy Scale: Good     Standing balance support: Single extremity supported;Bilateral upper extremity supported;During functional activity Standing balance-Leahy Scale: Poor Standing balance comment: needs UE support for balance                            Cognition   Behavior During Therapy: WFL for tasks assessed/performed;Flat affect Overall Cognitive Status: Impaired/Different from baseline Area of Impairment: Following commands;Awareness;Problem solving;Attention;Orientation                   Current Attention Level: Selective   Following Commands: Follows one step commands with increased time;Follows multi-step commands consistently     Problem Solving: Decreased initiation;Requires verbal cues;Slow processing        Exercises      General Comments General comments (skin integrity, edema, etc.): SpO2 with ambulation on RA WFL,  BP measured see vitals; patient educated in options for rehab and reports wants to return home alone, discussed needing family assist if interested in CIR.  Did not report preference, encouraged to think about it.      Pertinent Vitals/Pain Pain Assessment: No/denies  pain    Home Living                      Prior Function            PT Goals (current goals can now be found in the care plan section) Progress towards PT goals: Progressing toward goals    Frequency    Min 4X/week      PT Plan Current plan remains appropriate    Co-evaluation              AM-PAC PT "6 Clicks" Mobility   Outcome Measure  Help needed turning from your back to your side while in a flat bed without using  bedrails?: A Little Help needed moving from lying on your back to sitting on the side of a flat bed without using bedrails?: A Little Help needed moving to and from a bed to a chair (including a wheelchair)?: A Little Help needed standing up from a chair using your arms (e.g., wheelchair or bedside chair)?: A Little Help needed to walk in hospital room?: A Little Help needed climbing 3-5 steps with a railing? : A Lot 6 Click Score: 17    End of Session Equipment Utilized During Treatment: Gait belt Activity Tolerance: Patient limited by fatigue Patient left: in chair;with call bell/phone within reach;with chair alarm set   PT Visit Diagnosis: Other abnormalities of gait and mobility (R26.89);Muscle weakness (generalized) (M62.81);Other symptoms and signs involving the nervous system (R29.898)     Time: 9528-4132 PT Time Calculation (min) (ACUTE ONLY): 25 min  Charges:  $Gait Training: 8-22 mins $Self Care/Home Management: 8-22                     Sheran Lawless, PT Acute Rehabilitation Services [phone] 12/26/2019    Brandon Perkins 12/26/2019, 3:57 PM

## 2019-12-26 NOTE — Progress Notes (Signed)
Inpatient Rehab Admissions:  Inpatient Rehab Consult received.  I met with patient at the bedside for rehabilitation assessment and to discuss goals and expectations of an inpatient rehab admission.  He is open to CIR when bed available.  States that he does have children in the area but they work, and unsure what kind of support they would be able to provide at d/c from [place].  Note pt expected to reach intermittent supervision level, per rehab MD consult note.  I've left a voicemail with the pt's son to discuss available support.  Will continue to follow.   Signed: Shann Medal, PT, DPT Admissions Coordinator [phone] 12/26/19  3:47 PM

## 2019-12-27 ENCOUNTER — Encounter (HOSPITAL_COMMUNITY): Payer: Self-pay | Admitting: Pulmonary Disease

## 2019-12-27 MED ORDER — QUETIAPINE FUMARATE 25 MG PO TABS: 12.5000 mg | ORAL_TABLET | Freq: Two times a day (BID) | ORAL | Status: DC | PRN

## 2019-12-27 MED ORDER — LISINOPRIL 10 MG PO TABS
10.0000 mg | ORAL_TABLET | Freq: Every day | ORAL | Status: DC
  Administered 2019-12-27 – 2019-12-29 (×3): 10 mg via ORAL
  Filled 2019-12-27 (×3): qty 1

## 2019-12-27 NOTE — Progress Notes (Signed)
Patient tried to get out of bed and was assisted by RN to the bathroom.  Patient had increase confusion and agitaition, does not know where he is and kept repeating  ' one sock is for one legged person and he want to cut something.'  Refused to get back in bed. Patient is unsteady on his feet andcalm b eventually assisted back to bed with RN and NT insisitance.  Patient is calm BP 193/88 pulse 58,  O2 SAT 99% room air..BP rechecked 5 mins later 178/91.

## 2019-12-27 NOTE — Progress Notes (Signed)
Occupational Therapy Treatment Patient Details Name: Brandon Perkins MRN: [id number] DOB: 1951-01-18 Today's Date: 12/27/2019    History of present illness 69 yo male with PMHx of poorly-controlled HTN, GIB, hypertensive retinopathy, cataract, unspecified "arm clot" who presented to UC with slurred speech, weakness, confusion. At UC, SBP > 200, referred to ED, and found to have SBP >240. 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   OT comments  Pt seated in recliner upon entry, agreeable to OT.  He completes transfers with min assist to min guard using RW, in room mobility with min assist to min guard and grooming at sink with min guard assist.  LB dressing from recliner with min assist. Demonstrating increased safety awareness during session, asking therapist to clear pathway before mobilizing.  Noted some decreased coordination in L UE, but pt reports baseline.  Engaged in dynamic reaching tasks by placing washcloths on overhead cabinets x 2 with min guard, no losses of balance.  CIR remains appropriate. Will follow acutely.    Follow Up Recommendations  CIR;Supervision/Assistance - 24 hour    Equipment Recommendations  Tub/shower seat;Other (comment)(TBD in next venue of care )    Recommendations for Other Services Rehab consult    Precautions / Restrictions Precautions Precautions: Fall Restrictions Weight Bearing Restrictions: No       Mobility Bed Mobility                  Transfers Overall transfer level: Needs assistance Equipment used: Rolling walker (2 wheeled) Transfers: Sit to/from Stand Sit to Stand: Min assist         General transfer comment: for safety/balance    Balance Overall balance assessment: Needs assistance Sitting-balance support: Feet supported Sitting balance-Leahy Scale: Good Sitting balance - Comments: donning socks in recliner   Standing balance support: Bilateral upper extremity supported;Single extremity  supported;During functional activity Standing balance-Leahy Scale: Fair Standing balance comment: needs UE support for balance             High level balance activites: Direction changes;Turns High Level Balance Comments: Pt did reaching activity while standing at sink with RW           ADL either performed or assessed with clinical judgement   ADL Overall ADL's : Needs assistance/impaired     Grooming: Wash/dry hands;Wash/dry face;Standing;Min guard               Lower Body Dressing: Min guard;Sit to/from stand Lower Body Dressing Details (indicate cue type and reason): pt was able to doff/donn socks seated in recliner via figure 4; min A-guard sit to stand  Toilet Transfer: Minimal assistance;Ambulation;RW Toilet Transfer Details (indicate cue type and reason): simulated via transfer to recliner         Functional mobility during ADLs: Minimal assistance;Rolling walker General ADL Comments: pt with decreased UE coordination     Vision       Perception     Praxis      Cognition Arousal/Alertness: Awake/alert Behavior During Therapy: WFL for tasks assessed/performed;Flat affect Overall Cognitive Status: Impaired/Different from baseline Area of Impairment: Awareness;Problem solving;Attention                   Current Attention Level: Selective   Following Commands: Follows multi-step commands consistently   Awareness: Emergent Problem Solving: Decreased initiation;Requires verbal cues;Slow processing General Comments: Pt soft spoken; pt demonstrating good safety awarneess asking about clear pathway to bathroom        Exercises  Shoulder Instructions       General Comments BP prior to OT session 158/88 MAP 108    Pertinent Vitals/ Pain       Pain Assessment: No/denies pain  Home Living                                          Prior Functioning/Environment              Frequency  Min 2X/week         Progress Toward Goals  OT Goals(current goals can now be found in the care plan section)  Progress towards OT goals: Progressing toward goals  Acute Rehab OT Goals Patient Stated Goal: regain his independence OT Goal Formulation: With patient  Plan Discharge plan remains appropriate;Frequency remains appropriate    Co-evaluation                 AM-PAC OT "6 Clicks" Daily Activity     Outcome Measure   Help from another person eating meals?: A Little Help from another person taking care of personal grooming?: A Little Help from another person toileting, which includes using toliet, bedpan, or urinal?: A Little Help from another person bathing (including washing, rinsing, drying)?: A Little Help from another person to put on and taking off regular upper body clothing?: A Little Help from another person to put on and taking off regular lower body clothing?: A Little 6 Click Score: 18    End of Session Equipment Utilized During Treatment: Gait belt;Rolling walker  OT Visit Diagnosis: Unsteadiness on feet (R26.81);Other symptoms and signs involving cognitive function   Activity Tolerance Patient tolerated treatment well   Patient Left in chair;with call bell/phone within reach;with chair alarm set   Nurse Communication Mobility status        Time: 1025-8527 OT Time Calculation (min): 24 min  Charges: OT General Charges $OT Visit: 1 Visit OT Treatments $Self Care/Home Management : 8-22 mins $Therapeutic Activity: 8-22 mins  Barry Brunner, OT Acute Rehabilitation Services Pager [phone] Office [phone]    Chancy Milroy 12/27/2019, 2:51 PM

## 2019-12-27 NOTE — Progress Notes (Signed)
Inpatient Rehab Admissions Coordinator:   Left another message for pt's son.  If unable to provide support at discharge may need to look at [facility]. Will continue to follow for now.   Estill Dooms, PT, DPT Admissions Coordinator [phone] 12/27/19  1:11 PM

## 2019-12-27 NOTE — Progress Notes (Signed)
PROGRESS NOTE    Brandon Perkins  WJX:[id number] DOB: 07/31/1951 DOA: 12/24/2019 PCP: Patient, No Pcp Per    Brief Narrative:  69 year old gentleman with history of poorly controlled hypertension, reported GI bleeding, hypertensive retinopathy who presented to the urgent care with slurred speech, weakness and confusion.  He was also noted to be hypertensive with blood pressure more than 200.  In the emergency room, his blood pressures were more than 240.  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 Patient was admitted to intensive care unit with clevidipine infusion to control his blood pressure.  5/25, transition to oral antihypertensives and transferred to medical floor.   Assessment & Plan:   Active Problems:   Hypertensive emergency   Cerebral thrombosis with cerebral infarction  Acute thrombotic stroke: Suspect secondary to large vessel disease. Clinical findings, dysarthria and dysmetria.  Difficulty ambulating. CT head findings, right cerebral subacute infarct.  Small vessel disease. MRI of the brain, right superior cerebellar artery territory infarct. CTA of the head and neck, moderate to severe stenosis multiple areas. 2D echocardiogram, normal ejection fraction.  Severe left ventricular hypertrophy consistent with chronic diastolic dysfunction.  No source of emboli. Antiplatelet therapy, not taking any treatment at home.  Started on aspirin and Plavix.  Plan is to continue aspirin and Plavix for 3 months then aspirin alone. LDL 128.  Started on Lipitor 40 mg. Hemoglobin A1c, 6.0.  No indication for treatment. Therapy recommendations, acute inpatient rehab.  Hypertensive emergency: With acute stroke.  He was admitted to the intensive care unit and treated with intravenous infusion.  Blood pressures more than 240 on arrival.  Probably not consistently taking home medications. Resumed on amlodipine 10 mg, chlorthalidone 25 mg.  Started on lisinopril 10 mg.  blood  pressures are acceptable.  Labetalol as needed.  Hyperlipidemia: LDL 128.  Lipitor 40.  DVT prophylaxis: Lovenox subcu Code Status: Full code Family Communication: None none at the bedside Disposition Plan: Status is: Inpatient  Remains inpatient appropriate because:Unsafe d/c plan   Dispo: The patient is from: Home              Anticipated d/c is to: CIR              Anticipated d/c date is: 1 day              Patient currently is medically stable to d/c.  To rehab level of care.        Consultants:   Neurology  PCCM  Procedures:   None  Antimicrobials:   None   Subjective: Patient seen and examined.  Overnight he had some confusion, however currently denies any complaints.  He has no trouble swallowing.  He thinks his voice is more clear now.  Objective: Vitals:   12/27/19 0504 12/27/19 0812 12/27/19 1000 12/27/19 1100  BP: (!) 157/90 (!) 175/93    Pulse: 60     Resp: 16 17 19 18   Temp: (!) 97.5 F (36.4 C) 98 F (36.7 C)    TempSrc: Oral Oral    SpO2: 99% 99%    Weight:      Height:        Intake/Output Summary (Last 24 hours) at 12/27/2019 1140 Last data filed at 12/27/2019 0800 Gross per 24 hour  Intake 240 ml  Output 120 ml  Net 120 ml   Filed Weights   12/24/19 1244 12/24/19 2200  Weight: 81.6 kg 78.6 kg    Examination:  General exam: Appears calm  and comfortable , sitting in couch and eating breakfast.  He is mildly dysarthric, however his voice is more clearer today. Respiratory system: Clear to auscultation. Respiratory effort normal. Cardiovascular system: S1 & S2 heard, RRR.  Gastrointestinal system: Abdomen is nondistended, soft and nontender.  Central nervous system: Dysarthria present.  Gait not tested.  Power equal. Extremities: Symmetric 5 x 5 power. Skin: No rashes, lesions or ulcers Psychiatry: Judgement and insight appear normal. Mood & affect appropriate.     Data Reviewed: I have personally reviewed following labs and  imaging studies  CBC: Recent Labs  Lab 12/24/19 1349 12/24/19 1417  WBC  --  5.0  NEUTROABS  --  2.7  HGB 13.9 13.0  HCT 41.0 42.6  MCV  --  84.4  PLT  --  179   Basic Metabolic Panel: Recent Labs  Lab 12/24/19 1339 12/24/19 1349 12/24/19 1849 12/25/19 0719 12/26/19 0233 12/26/19 0715  NA 139 136 139 140  --  139  K 5.8* 5.7* 3.9 3.6  --  4.0  CL 104 106 103 99  --  104  CO2 26  --  25 28  --  25  GLUCOSE 101* 94 124* 120*  --  106*  BUN 11 15 9 10   --  22  CREATININE 1.01 1.00 0.90 0.94  --  1.24  CALCIUM 8.8*  --  9.3 9.4  --  9.3  MG  --   --  1.7  --  1.8  --    GFR: Estimated Creatinine Clearance: 64.3 mL/min (by C-G formula based on SCr of 1.24 mg/dL). Liver Function Tests: Recent Labs  Lab 12/24/19 1339  AST 45*  ALT 15  ALKPHOS 49  BILITOT 1.6*  PROT 6.6  ALBUMIN 3.3*   No results for input(s): LIPASE, AMYLASE in the last 168 hours. No results for input(s): AMMONIA in the last 168 hours. Coagulation Profile: No results for input(s): INR, PROTIME in the last 168 hours. Cardiac Enzymes: No results for input(s): CKTOTAL, CKMB, CKMBINDEX, TROPONINI in the last 168 hours. BNP (last 3 results) No results for input(s): PROBNP in the last 8760 hours. HbA1C: Recent Labs    12/25/19 0719  HGBA1C 6.0*   CBG: Recent Labs  Lab 12/24/19 1338 12/25/19 2355  GLUCAP 88 102*   Lipid Profile: Recent Labs    12/25/19 0719  CHOL 202*  HDL 64  LDLCALC 128*  TRIG 51  CHOLHDL 3.2   Thyroid Function Tests: No results for input(s): TSH, T4TOTAL, FREET4, T3FREE, THYROIDAB in the last 72 hours. Anemia Panel: No results for input(s): VITAMINB12, FOLATE, FERRITIN, TIBC, IRON, RETICCTPCT in the last 72 hours. Sepsis Labs: No results for input(s): PROCALCITON, LATICACIDVEN in the last 168 hours.  Recent Results (from the past 240 hour(s))  SARS Coronavirus 2 by RT PCR (hospital order, performed in [facility] hospital lab) Nasopharyngeal Nasopharyngeal  Swab     Status: None   Collection Time: 12/24/19  6:49 PM   Specimen: Nasopharyngeal Swab  Result Value Ref Range Status   SARS Coronavirus 2 NEGATIVE NEGATIVE Final    Comment: (NOTE) SARS-CoV-2 target nucleic acids are NOT DETECTED. The SARS-CoV-2 RNA is generally detectable in upper and lower respiratory specimens during the acute phase of infection. The lowest concentration of SARS-CoV-2 viral copies this assay can detect is 250 copies / mL. A negative result does not preclude SARS-CoV-2 infection and should not be used as the sole basis for treatment or other patient management decisions.  A negative result may occur with improper specimen collection / handling, submission of specimen other than nasopharyngeal swab, presence of viral mutation(s) within the areas targeted by this assay, and inadequate number of viral copies (<250 copies / mL). A negative result must be combined with clinical observations, patient history, and epidemiological information. Fact Sheet for Patients:   BoilerBrush.com.cy Fact Sheet for Healthcare Providers: https://pope.com/ This test is not yet approved or cleared  by the [place] FDA and has been authorized for detection and/or diagnosis of SARS-CoV-2 by FDA under an Emergency Use Authorization (EUA).  This EUA will remain in effect (meaning this test can be used) for the duration of the COVID-19 declaration under Section 564(b)(1) of the Act, 21 U.S.C. section 360bbb-3(b)(1), unless the authorization is terminated or revoked sooner. Performed at [facility], [address] [place], [place] [address]   MRSA PCR Screening     Status: None   Collection Time: 12/24/19 10:14 PM   Specimen: Nasal Mucosa; Nasopharyngeal  Result Value Ref Range Status   MRSA by PCR NEGATIVE NEGATIVE Final    Comment:        The GeneXpert MRSA Assay (FDA approved for NASAL specimens only), is one component  of a comprehensive MRSA colonization surveillance program. It is not intended to diagnose MRSA infection nor to guide or monitor treatment for MRSA infections. Performed at [facility], [address] [place], [place] [address]          Radiology Studies: ECHOCARDIOGRAM COMPLETE  Result Date: 12/26/2019    ECHOCARDIOGRAM REPORT   Patient Name:   LUISMIGUEL LAMERE Date of Exam: 12/26/2019 Medical Rec #:  [id number]      Height:       74.0 in Accession #:    [id number]     Weight:       173.3 lb Date of Birth:  [birth year]       BSA:          2.045 m Patient Age:    69 years       BP:           162/88 mmHg Patient Gender: M              HR:           66 bpm. Exam Location:  Inpatient Procedure: 2D Echo, Cardiac Doppler and Color Doppler Indications:    Stroke  History:        Patient has prior history of Echocardiogram examinations, most                 recent 11/08/2014. Risk Factors:Hypertension.  Sonographer:    Ross Ludwig RDCS (AE) Referring Phys: [id number] ASHISH ARORA IMPRESSIONS  1. Left ventricular ejection fraction, by estimation, is 55 to 60%. The left ventricle has normal function. The left ventricle has no regional wall motion abnormalities. There is severe left ventricular hypertrophy. Left ventricular diastolic parameters  are consistent with Grade I diastolic dysfunction (impaired relaxation). Consider cardiac amyloidosis.  2. Right ventricular systolic function is normal. The right ventricular size is normal.  3. The mitral valve is normal in structure. No evidence of mitral valve regurgitation. No evidence of mitral stenosis.  4. The aortic valve is tricuspid. Aortic valve regurgitation is mild. Mild aortic valve sclerosis is present, with no evidence of aortic valve stenosis.  5. Peak RV-RA gradient 19 mmHg.  6. The IVC was not visualized. FINDINGS  Left Ventricle: Left ventricular ejection fraction,  by estimation, is 55 to 60%. The left ventricle has normal function. The left  ventricle has no regional wall motion abnormalities. The left ventricular internal cavity size was normal in size. There is  severe left ventricular hypertrophy. Left ventricular diastolic parameters are consistent with Grade I diastolic dysfunction (impaired relaxation). Right Ventricle: The right ventricular size is normal. No increase in right ventricular wall thickness. Right ventricular systolic function is normal. Left Atrium: Left atrial size was normal in size. Right Atrium: Right atrial size was normal in size. Pericardium: There is no evidence of pericardial effusion. Mitral Valve: The mitral valve is normal in structure. Mild mitral annular calcification. No evidence of mitral valve regurgitation. No evidence of mitral valve stenosis. Tricuspid Valve: The tricuspid valve is normal in structure. Tricuspid valve regurgitation is trivial. Aortic Valve: The aortic valve is tricuspid. Aortic valve regurgitation is mild. Aortic regurgitation PHT measures 1716 msec. Mild aortic valve sclerosis is present, with no evidence of aortic valve stenosis. Aortic valve mean gradient measures 4.0 mmHg.  Aortic valve peak gradient measures 6.2 mmHg. Aortic valve area, by VTI measures 2.56 cm. Pulmonic Valve: The pulmonic valve was normal in structure. Pulmonic valve regurgitation is trivial. Aorta: The aortic root is normal in size and structure. Venous: The inferior vena cava was not well visualized. IAS/Shunts: No atrial level shunt detected by color flow Doppler.  LEFT VENTRICLE PLAX 2D LVIDd:         4.01 cm  Diastology LVIDs:         2.84 cm  LV e' lateral:   5.93 cm/s LV PW:         1.62 cm  LV E/e' lateral: 8.0 LV IVS:        1.65 cm  LV e' medial:    3.38 cm/s LVOT diam:     2.20 cm  LV E/e' medial:  14.0 LV SV:         59 LV SV Index:   29 LVOT Area:     3.80 cm  RIGHT VENTRICLE RV Basal diam:  2.97 cm RV S prime:     11.10 cm/s TAPSE (M-mode): 1.8 cm LEFT ATRIUM             Index       RIGHT ATRIUM            Index LA diam:        2.00 cm 0.98 cm/m  RA Area:     16.40 cm LA Vol (A2C):   52.9 ml 25.87 ml/m RA Volume:   41.00 ml  20.05 ml/m LA Vol (A4C):   72.7 ml 35.55 ml/m LA Biplane Vol: 64.9 ml 31.74 ml/m  AORTIC VALVE AV Area (Vmax):    2.78 cm AV Area (Vmean):   2.59 cm AV Area (VTI):     2.56 cm AV Vmax:           125.00 cm/s AV Vmean:          92.000 cm/s AV VTI:            0.232 m AV Peak Grad:      6.2 mmHg AV Mean Grad:      4.0 mmHg LVOT Vmax:         91.40 cm/s LVOT Vmean:        62.700 cm/s LVOT VTI:          0.156 m LVOT/AV VTI ratio: 0.67 AI PHT:  1716 msec  AORTA Ao Root diam: 3.90 cm MITRAL VALVE               TRICUSPID VALVE MV Area (PHT): 3.27 cm    TR Peak grad:   19.9 mmHg MV Decel Time: 232 msec    TR Vmax:        223.00 cm/s MV E velocity: 47.30 cm/s MV A velocity: 68.60 cm/s  SHUNTS MV E/A ratio:  0.69        Systemic VTI:  0.16 m                            Systemic Diam: 2.20 cm Loralie Champagne MD Electronically signed by Loralie Champagne MD Signature Date/Time: 12/26/2019/3:59:06 PM    Final         Scheduled Meds: .  stroke: mapping our early stages of recovery book   Does not apply Once  . amLODipine  10 mg Oral Daily  . aspirin EC  81 mg Oral Daily  . atorvastatin  40 mg Oral Daily  . Chlorhexidine Gluconate Cloth  6 each Topical Daily  . chlorthalidone  25 mg Oral Daily  . clopidogrel  75 mg Oral Daily  . enoxaparin (LOVENOX) injection  40 mg Subcutaneous Q24H  . lisinopril  10 mg Oral Daily   Continuous Infusions:   LOS: 3 days    Time spent: 30 minutes     Barb Merino, MD [facility] Pager [phone]

## 2019-12-27 NOTE — Progress Notes (Signed)
STROKE TEAM PROGRESS NOTE   INTERVAL HISTORY Patient is sitting in bedside chair eating lunch...  He has dysarthria but can be understood with some difficulty.  He became agitated last night and had to be redirected.  His family is not sure they can provide the support he needs after discharge from rehab  Vitals:   12/27/19 1205 12/27/19 1300 12/27/19 1400 12/27/19 1553  BP: (!) 153/83   (!) 153/86  Pulse: 81 (!) 104 77 63  Resp: 13 (!) 21 19 17   Temp: 98.2 F (36.8 C)   98 F (36.7 C)  TempSrc: Oral   Oral  SpO2: 97% 96% 95% 98%  Weight:      Height:        CBC:  Recent Labs  Lab 12/24/19 1349 12/24/19 1417  WBC  --  5.0  NEUTROABS  --  2.7  HGB 13.9 13.0  HCT 41.0 42.6  MCV  --  84.4  PLT  --  732    Basic Metabolic Panel:  Recent Labs  Lab 12/24/19 1849 12/24/19 1849 12/25/19 0719 12/26/19 0233 12/26/19 0715  NA 139   < > 140  --  139  K 3.9   < > 3.6  --  4.0  CL 103   < > 99  --  104  CO2 25   < > 28  --  25  GLUCOSE 124*   < > 120*  --  106*  BUN 9   < > 10  --  22  CREATININE 0.90   < > 0.94  --  1.24  CALCIUM 9.3   < > 9.4  --  9.3  MG 1.7  --   --  1.8  --    < > = values in this interval not displayed.   Lipid Panel:     Component Value Date/Time   CHOL 202 (H) 12/25/2019 0719   TRIG 51 12/25/2019 0719   HDL 64 12/25/2019 0719   CHOLHDL 3.2 12/25/2019 0719   VLDL 10 12/25/2019 0719   LDLCALC 128 (H) 12/25/2019 0719   HgbA1c:  Lab Results  Component Value Date   HGBA1C 6.0 (H) 12/25/2019   Urine Drug Screen:     Component Value Date/Time   LABOPIA NONE DETECTED 12/24/2019 1339   COCAINSCRNUR NONE DETECTED 12/24/2019 1339   LABBENZ NONE DETECTED 12/24/2019 1339   AMPHETMU NONE DETECTED 12/24/2019 1339   THCU NONE DETECTED 12/24/2019 1339   LABBARB NONE DETECTED 12/24/2019 1339    Alcohol Level     Component Value Date/Time   ETH <10 12/24/2019 1339    IMAGING past 24 hours No results found.  PHYSICAL EXAM Pleasant  middle-aged African-American male not in distress. . Afebrile. Head is nontraumatic. Neck is supple without bruit.    Cardiac exam no murmur or gallop. Lungs are clear to auscultation. Distal pulses are well felt. Neurological Exam :  Awake alert oriented to time place and person.  Moderate dysarthria but can be understood with some difficulty.  Extraocular movements are full range without nystagmus but has saccadic dysmetria on lateral gaze.  Pupils equal reactive.  Fundi not visualized.  Face is symmetric without weakness.  Tongue midline.  Motor system exam shows no upper or lower extremity drift but mild finger-to-nose and knee to heel dysmetria on the right sensation intact bilaterally.  Deep tendon flexes symmetric.  Plantars downgoing.  Gait not tested.  ASSESSMENT/PLAN Mr. BRECK MARYLAND is a 69 y.o. male  with history of HTN, hypertensive retinopathy, "left arm vein blood clot" and GI bleed presenting with slurred speech, weakness, gait unsteadiness, dizziness and confusion with SBP > 240.  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 Small vessel disease. Atrophy.   MRI  R superior cerebellar artery territory infarct. Small vessel disease. Cervical spinal degernation w/ mild stenosis C3-4.   CTA head & neck moderate and severe stenosis BA tip, B PCA, B ACA and SCA origins. R SCA partially patent. Advanced intracranial atherosclerosis, mild atherosclerosis in the neck  2D Echo pending  LDL 128   HgbA1c 6.0  Lovenox 40 mg sq daily for VTE prophylaxis  Not taking prescribed aspirin 81 mg daily prior to admission, now on aspirin 81 mg daily. Add plavix. Continue DAPT x 3 months then aspirin alone   Therapy recommendations:  CIR - order placed  Disposition:  pending   Hypertensive Urgency  SBP > 240 on arrival  Home meds:  Amlodipine 10, HCTZ 25 - > pt reported he was not taking  BP now  170s  Currently on cleviprex, wean as able - CCM managing  labetolol prn  On amlodipine 10, lasix 40 -> changed to amlodipine 10 and chlorthalidone 25  . Permissive hypertension (OK if < 220/120) but gradually normalize in 5-7 days . Check orthostatics . Long-term BP goal 140-160  Hyperlipidemia  Home meds:  No statin  LDL 128, goal < 70  Added lipitor 40  Continue statin at discharge  Other Stroke Risk Factors  Advanced age  Family hx stroke (sister - aneurysm)  Other Active Problems      Hospital day # 3 Continue ongoing therapy needs and transfer to rehab when bed available.  Aspirin and Plavix for 3 months followed by aspirin alone and aggressive risk factor modification.  May add low-dose Seroquel 12.5 mg as needed at night for sleep and agitation.  Discussed with Dr. Jerral Ralph.  Stroke team will sign off.  Kindly call for questions Delia Heady, MD  To contact Stroke Continuity provider, please refer to [email or web address]. After hours, contact General Neurology

## 2019-12-27 NOTE — Progress Notes (Signed)
Physical Therapy Treatment Patient Details Name: Brandon Perkins MRN: [id number] DOB: 21-Sep-1950 Today's Date: 12/27/2019    History of Present Illness 69 yo male with PMHx of poorly-controlled HTN, GIB, hypertensive retinopathy, cataract, unspecified "arm clot" who presented to UC with slurred speech, weakness, confusion. At UC, SBP > 200, referred to ED, and found to have SBP >240. 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    PT Comments    Patient seen for mobility progression. This session focused on gait training with SPC and RW. Pt is more steady with bilat UE and continues to require min A for balance. Continue to recommend CIR for further skilled PT services to maximize independence and safety with mobility.    Follow Up Recommendations  CIR     Equipment Recommendations  Rolling walker with 5" wheels    Recommendations for Other Services Rehab consult     Precautions / Restrictions Precautions Precautions: Fall Restrictions Weight Bearing Restrictions: No    Mobility  Bed Mobility               General bed mobility comments: pt OOB in chair upon arrival   Transfers Overall transfer level: Needs assistance Equipment used: Straight cane Transfers: Sit to/from Stand Sit to Stand: Min assist         General transfer comment: for safety/balance  Ambulation/Gait Ambulation/Gait assistance: Min assist Gait Distance (Feet): (100 ft X 2 with standing break) Assistive device: Straight cane;Rolling walker (2 wheeled) Gait Pattern/deviations: Step-to pattern;Step-through pattern;Shuffle;Wide base of support Gait velocity: decreased   General Gait Details: initially ambulating in room with SPC and then use of RW due to instability; cues for safe use of AD and assistance for balance especially with turning    Stairs             Wheelchair Mobility    Modified Rankin (Stroke Patients Only)       Balance Overall balance assessment:  Needs assistance Sitting-balance support: Feet supported Sitting balance-Leahy Scale: Good Sitting balance - Comments: donning socks in recliner   Standing balance support: Bilateral upper extremity supported;Single extremity supported;During functional activity Standing balance-Leahy Scale: Fair Standing balance comment: needs UE support for balance             High level balance activites: Direction changes;Turns High Level Balance Comments: Pt did reaching activity while standing at sink with RW            Cognition Arousal/Alertness: Awake/alert Behavior During Therapy: WFL for tasks assessed/performed;Flat affect Overall Cognitive Status: Impaired/Different from baseline Area of Impairment: Awareness;Problem solving;Attention                   Current Attention Level: Selective   Following Commands: Follows multi-step commands consistently   Awareness: Emergent Problem Solving: Decreased initiation;Requires verbal cues;Slow processing General Comments: Pt soft spoken      Exercises      General Comments General comments (skin integrity, edema, etc.): BP prior to OT session 158/88 MAP 108      Pertinent Vitals/Pain Pain Assessment: No/denies pain    Home Living                      Prior Function            PT Goals (current goals can now be found in the care plan section) Acute Rehab PT Goals Patient Stated Goal: regain his independence Progress towards PT goals: Progressing toward goals    Frequency  Min 4X/week      PT Plan Current plan remains appropriate    Co-evaluation              AM-PAC PT "6 Clicks" Mobility   Outcome Measure  Help needed turning from your back to your side while in a flat bed without using bedrails?: A Little Help needed moving from lying on your back to sitting on the side of a flat bed without using bedrails?: A Little Help needed moving to and from a bed to a chair (including a  wheelchair)?: A Little Help needed standing up from a chair using your arms (e.g., wheelchair or bedside chair)?: A Little Help needed to walk in hospital room?: A Little Help needed climbing 3-5 steps with a railing? : A Lot 6 Click Score: 17    End of Session Equipment Utilized During Treatment: Gait belt Activity Tolerance: Patient tolerated treatment well Patient left: in chair;with call bell/phone within reach;with chair alarm set Nurse Communication: Mobility status PT Visit Diagnosis: Other abnormalities of gait and mobility (R26.89);Muscle weakness (generalized) (M62.81);Other symptoms and signs involving the nervous system (R29.898)     Time: 3762-8315 PT Time Calculation (min) (ACUTE ONLY): 21 min  Charges:  $Gait Training: 8-22 mins                     Brandon Perkins, Brandon Perkins Acute Rehabilitation Services Pager: [phone] Office: [phone]     Brandon Perkins 12/27/2019, 4:18 PM

## 2019-12-28 NOTE — TOC Initial Note (Signed)
Transition of Care [facility]) - Initial/Assessment Note    Patient Details  Name: Brandon Perkins MRN: [id number] Date of Birth: [birth year]  Transition of Care [facility]) CM/SW Contact:    Kirstie Peri, [place] Work Phone Number: 12/28/2019, 2:27 PM  Clinical Narrative:                 MSW Intern went to discuss discharge planning with pt. Pt has slurred speech which made the conversation difficult, but he seemed apprehensive with the change in plans. He was advised that he is no longer able to go to CIR and will need to choose between another inpatient rehab or SNF placement. The options were discussed and questions answered. Pt said that he would just rather go home, but he is aware that his condition that would make that unsafe. He said he needed to think about his options and to follow back up with him. We discussed the difficulties in reaching his family members and he stated that he needed to make his own decisions. SW will follow back up with pt for choice. FL2 completed.  Expected Discharge Plan: Skilled Nursing Facility Barriers to Discharge: Continued Medical Work up   Patient Goals and CMS Choice Patient states their goals for this hospitalization and ongoing recovery are:: Pt making choice between SNF and another inpatient rehab facility. CMS Medicare.gov Compare Post Acute Care list provided to:: Patient Choice offered to / list presented to : Patient  Expected Discharge Plan and Services Expected Discharge Plan: [place] In-house Referral: Clinical Social Work   Post Acute Care Choice: [place] Living arrangements for the past 2 months: [place]                                      Prior Living Arrangements/Services Living arrangements for the past 2 months: Single Family Home Lives with:: Self Patient language and need for interpreter reviewed:: Yes Do you feel safe going back to the place where you live?: Yes       Need for Family Participation in Patient Care: No (Comment) Care giver support system in place?: No (comment)   Criminal Activity/Legal Involvement Pertinent to Current Situation/Hospitalization: No - Comment as needed  Activities of Daily Living   ADL Screening (condition at time of admission) Patient's cognitive ability adequate to safely complete daily activities?: Yes Is the patient deaf or have difficulty hearing?: No Does the patient have difficulty seeing, even when wearing glasses/contacts?: No Patient able to express need for assistance with ADLs?: Yes Does the patient have difficulty dressing or bathing?: No Independently performs ADLs?: Yes (appropriate for developmental age) Weakness of Legs: Right  Permission Sought/Granted   Permission granted to share information with : No              Emotional Assessment Appearance:: Appears stated age Attitude/Demeanor/Rapport: Apprehensive Affect (typically observed): Appropriate Orientation: : Oriented to Self, Oriented to Place, Oriented to  Time, Oriented to Situation Alcohol / Substance Use: Not Applicable Psych Involvement: No (comment)  Admission diagnosis:  Hypertensive emergency [I16.1] Patient Active Problem List   Diagnosis Date Noted  . Cerebral thrombosis with cerebral infarction 12/25/2019  . Hypertensive emergency 12/24/2019  . Hypokalemia 11/07/2014  . Hypertensive urgency 11/07/2014  . Elevated blood pressure 11/06/2014  . Syncope, near 11/06/2014  . GI bleeding 11/06/2014  . Hand laceration 11/06/2014   PCP:  Patient, No  Pcp Per Pharmacy:   [id number] [place], [place] - [address] AT [facility] OF [facility] & [address] [place] [address] Phone: [phone] Fax: [phone]  [facility] [id number] [place], [place] - [address] AT [facility] OF [address] & [address] [place] [address] Phone:  [phone] Fax: [phone]     Social Determinants of Health (SDOH) Interventions    Readmission Risk Interventions No flowsheet data found.

## 2019-12-28 NOTE — Progress Notes (Signed)
PROGRESS NOTE    Brandon Perkins  POE:[id number] DOB: Jul 30, 1951 DOA: 12/24/2019 PCP: Patient, No Pcp Per    Brief Narrative:  69 year old gentleman with history of poorly controlled hypertension, reported GI bleeding, hypertensive retinopathy who presented to the urgent care with slurred speech, weakness and confusion.  He was also noted to be hypertensive with blood pressure more than 200.  In the emergency room, his blood pressures were more than 240.  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 Patient was admitted to intensive care unit with clevidipine infusion to control his blood pressure.  5/25, transition to oral antihypertensives and transferred to medical floor.   Assessment & Plan:   Active Problems:   Hypertensive emergency   Cerebral thrombosis with cerebral infarction  Acute thrombotic stroke: Suspect secondary to large vessel disease. Clinical findings, dysarthria and dysmetria.  Difficulty ambulating. CT head findings, right cerebral subacute infarct.  Small vessel disease. MRI of the brain, right superior cerebellar artery territory infarct. CTA of the head and neck, moderate to severe stenosis multiple areas. 2D echocardiogram, normal ejection fraction.  Severe left ventricular hypertrophy consistent with chronic diastolic dysfunction.  No source of emboli. Antiplatelet therapy, not taking any treatment at home.  Started on aspirin and Plavix.  Plan is to continue aspirin and Plavix for 3 months then aspirin alone. LDL 128.  Started on Lipitor 40 mg. Hemoglobin A1c, 6.0.  No indication for treatment. Therapy recommendations, acute inpatient rehab.  Hypertensive emergency: With acute stroke.  He was admitted to the intensive care unit and treated with intravenous infusion.  Blood pressures more than 240 on arrival.  Probably not consistently taking home medications. Resumed on amlodipine 10 mg, chlorthalidone 25 mg.  Started on lisinopril 10 mg.  blood  pressures are acceptable.  Labetalol as needed.  Hyperlipidemia: LDL 128.  Lipitor 40.  DVT prophylaxis: Lovenox subcu Code Status: Full code Family Communication: none at the bedside Disposition Plan: Status is: Inpatient  Remains inpatient appropriate because:Unsafe d/c plan   Dispo: The patient is from: Home              Anticipated d/c is to: CIR              Anticipated d/c date is: 1 day              Patient currently is medically stable to d/c.  To rehab level of care.  Patient is medically stable to discharge to the rehab level of care.        Consultants:   Neurology  PCCM  Procedures:   None  Antimicrobials:   None   Subjective: Seen and examined.  No overnight events.  He was able to talk clearly today.  Objective: Vitals:   12/27/19 1958 12/27/19 2347 12/28/19 0410 12/28/19 0825  BP: (!) 150/83 (!) 144/73 140/72 (!) 147/83  Pulse: 82 79 77 68  Resp: 19 18 18 18   Temp: 98.2 F (36.8 C) 98 F (36.7 C) 98.2 F (36.8 C) 97.9 F (36.6 C)  TempSrc: Oral Oral Oral Oral  SpO2: 98% 98% 98% 100%  Weight:      Height:        Intake/Output Summary (Last 24 hours) at 12/28/2019 1016 Last data filed at 12/28/2019 0900 Gross per 24 hour  Intake 360 ml  Output 850 ml  Net -490 ml   Filed Weights   12/24/19 1244 12/24/19 2200  Weight: 81.6 kg 78.6 kg    Examination:  General  exam: Appears calm and comfortable , sleeping in bed. Respiratory system: Clear to auscultation. Respiratory effort normal. Cardiovascular system: S1 & S2 heard, RRR.  Gastrointestinal system: Abdomen is nondistended, soft and nontender.  Central nervous system: Dysarthria present.  Gait not tested.  Power equal. Extremities: Symmetric 5 x 5 power. Skin: No rashes, lesions or ulcers Psychiatry: Judgement and insight appear normal. Mood & affect appropriate.     Data Reviewed: I have personally reviewed following labs and imaging studies  CBC: Recent Labs  Lab  12/24/19 1349 12/24/19 1417  WBC  --  5.0  NEUTROABS  --  2.7  HGB 13.9 13.0  HCT 41.0 42.6  MCV  --  84.4  PLT  --  179   Basic Metabolic Panel: Recent Labs  Lab 12/24/19 1339 12/24/19 1349 12/24/19 1849 12/25/19 0719 12/26/19 0233 12/26/19 0715  NA 139 136 139 140  --  139  K 5.8* 5.7* 3.9 3.6  --  4.0  CL 104 106 103 99  --  104  CO2 26  --  25 28  --  25  GLUCOSE 101* 94 124* 120*  --  106*  BUN 11 15 9 10   --  22  CREATININE 1.01 1.00 0.90 0.94  --  1.24  CALCIUM 8.8*  --  9.3 9.4  --  9.3  MG  --   --  1.7  --  1.8  --    GFR: Estimated Creatinine Clearance: 64.3 mL/min (by C-G formula based on SCr of 1.24 mg/dL). Liver Function Tests: Recent Labs  Lab 12/24/19 1339  AST 45*  ALT 15  ALKPHOS 49  BILITOT 1.6*  PROT 6.6  ALBUMIN 3.3*   No results for input(s): LIPASE, AMYLASE in the last 168 hours. No results for input(s): AMMONIA in the last 168 hours. Coagulation Profile: No results for input(s): INR, PROTIME in the last 168 hours. Cardiac Enzymes: No results for input(s): CKTOTAL, CKMB, CKMBINDEX, TROPONINI in the last 168 hours. BNP (last 3 results) No results for input(s): PROBNP in the last 8760 hours. HbA1C: No results for input(s): HGBA1C in the last 72 hours. CBG: Recent Labs  Lab 12/24/19 1338 12/25/19 2355  GLUCAP 88 102*   Lipid Profile: No results for input(s): CHOL, HDL, LDLCALC, TRIG, CHOLHDL, LDLDIRECT in the last 72 hours. Thyroid Function Tests: No results for input(s): TSH, T4TOTAL, FREET4, T3FREE, THYROIDAB in the last 72 hours. Anemia Panel: No results for input(s): VITAMINB12, FOLATE, FERRITIN, TIBC, IRON, RETICCTPCT in the last 72 hours. Sepsis Labs: No results for input(s): PROCALCITON, LATICACIDVEN in the last 168 hours.  Recent Results (from the past 240 hour(s))  SARS Coronavirus 2 by RT PCR (hospital order, performed in [facility] hospital lab) Nasopharyngeal Nasopharyngeal Swab     Status: None   Collection Time:  12/24/19  6:49 PM   Specimen: Nasopharyngeal Swab  Result Value Ref Range Status   SARS Coronavirus 2 NEGATIVE NEGATIVE Final    Comment: (NOTE) SARS-CoV-2 target nucleic acids are NOT DETECTED. The SARS-CoV-2 RNA is generally detectable in upper and lower respiratory specimens during the acute phase of infection. The lowest concentration of SARS-CoV-2 viral copies this assay can detect is 250 copies / mL. A negative result does not preclude SARS-CoV-2 infection and should not be used as the sole basis for treatment or other patient management decisions.  A negative result may occur with improper specimen collection / handling, submission of specimen other than nasopharyngeal swab, presence of viral mutation(s) within  the areas targeted by this assay, and inadequate number of viral copies (<250 copies / mL). A negative result must be combined with clinical observations, patient history, and epidemiological information. Fact Sheet for Patients:   BoilerBrush.com.cy Fact Sheet for Healthcare Providers: https://pope.com/ This test is not yet approved or cleared  by the [place] FDA and has been authorized for detection and/or diagnosis of SARS-CoV-2 by FDA under an Emergency Use Authorization (EUA).  This EUA will remain in effect (meaning this test can be used) for the duration of the COVID-19 declaration under Section 564(b)(1) of the Act, 21 U.S.C. section 360bbb-3(b)(1), unless the authorization is terminated or revoked sooner. Performed at [facility], [address] [place], [place] [address]   MRSA PCR Screening     Status: None   Collection Time: 12/24/19 10:14 PM   Specimen: Nasal Mucosa; Nasopharyngeal  Result Value Ref Range Status   MRSA by PCR NEGATIVE NEGATIVE Final    Comment:        The GeneXpert MRSA Assay (FDA approved for NASAL specimens only), is one component of a comprehensive MRSA  colonization surveillance program. It is not intended to diagnose MRSA infection nor to guide or monitor treatment for MRSA infections. Performed at [facility], [address], [place], [place] [address]          Radiology Studies: ECHOCARDIOGRAM COMPLETE  Result Date: 12/26/2019    ECHOCARDIOGRAM REPORT   Patient Name:   Brandon Perkins Date of Exam: 12/26/2019 Medical Rec #:  [id number]      Height:       74.0 in Accession #:    [id number]     Weight:       173.3 lb Date of Birth:  1951-08-01       BSA:          2.045 m Patient Age:    69 years       BP:           162/88 mmHg Patient Gender: M              HR:           66 bpm. Exam Location:  Inpatient Procedure: 2D Echo, Cardiac Doppler and Color Doppler Indications:    Stroke  History:        Patient has prior history of Echocardiogram examinations, most                 recent 11/08/2014. Risk Factors:Hypertension.  Sonographer:    Ross Ludwig RDCS (AE) Referring Phys: [id number] ASHISH ARORA IMPRESSIONS  1. Left ventricular ejection fraction, by estimation, is 55 to 60%. The left ventricle has normal function. The left ventricle has no regional wall motion abnormalities. There is severe left ventricular hypertrophy. Left ventricular diastolic parameters  are consistent with Grade I diastolic dysfunction (impaired relaxation). Consider cardiac amyloidosis.  2. Right ventricular systolic function is normal. The right ventricular size is normal.  3. The mitral valve is normal in structure. No evidence of mitral valve regurgitation. No evidence of mitral stenosis.  4. The aortic valve is tricuspid. Aortic valve regurgitation is mild. Mild aortic valve sclerosis is present, with no evidence of aortic valve stenosis.  5. Peak RV-RA gradient 19 mmHg.  6. The IVC was not visualized. FINDINGS  Left Ventricle: Left ventricular ejection fraction, by estimation, is 55 to 60%. The left ventricle has normal function. The left ventricle has no regional wall  motion abnormalities. The  left ventricular internal cavity size was normal in size. There is  severe left ventricular hypertrophy. Left ventricular diastolic parameters are consistent with Grade I diastolic dysfunction (impaired relaxation). Right Ventricle: The right ventricular size is normal. No increase in right ventricular wall thickness. Right ventricular systolic function is normal. Left Atrium: Left atrial size was normal in size. Right Atrium: Right atrial size was normal in size. Pericardium: There is no evidence of pericardial effusion. Mitral Valve: The mitral valve is normal in structure. Mild mitral annular calcification. No evidence of mitral valve regurgitation. No evidence of mitral valve stenosis. Tricuspid Valve: The tricuspid valve is normal in structure. Tricuspid valve regurgitation is trivial. Aortic Valve: The aortic valve is tricuspid. Aortic valve regurgitation is mild. Aortic regurgitation PHT measures 1716 msec. Mild aortic valve sclerosis is present, with no evidence of aortic valve stenosis. Aortic valve mean gradient measures 4.0 mmHg.  Aortic valve peak gradient measures 6.2 mmHg. Aortic valve area, by VTI measures 2.56 cm. Pulmonic Valve: The pulmonic valve was normal in structure. Pulmonic valve regurgitation is trivial. Aorta: The aortic root is normal in size and structure. Venous: The inferior vena cava was not well visualized. IAS/Shunts: No atrial level shunt detected by color flow Doppler.  LEFT VENTRICLE PLAX 2D LVIDd:         4.01 cm  Diastology LVIDs:         2.84 cm  LV e' lateral:   5.93 cm/s LV PW:         1.62 cm  LV E/e' lateral: 8.0 LV IVS:        1.65 cm  LV e' medial:    3.38 cm/s LVOT diam:     2.20 cm  LV E/e' medial:  14.0 LV SV:         59 LV SV Index:   29 LVOT Area:     3.80 cm  RIGHT VENTRICLE RV Basal diam:  2.97 cm RV S prime:     11.10 cm/s TAPSE (M-mode): 1.8 cm LEFT ATRIUM             Index       RIGHT ATRIUM           Index LA diam:        2.00 cm  0.98 cm/m  RA Area:     16.40 cm LA Vol (A2C):   52.9 ml 25.87 ml/m RA Volume:   41.00 ml  20.05 ml/m LA Vol (A4C):   72.7 ml 35.55 ml/m LA Biplane Vol: 64.9 ml 31.74 ml/m  AORTIC VALVE AV Area (Vmax):    2.78 cm AV Area (Vmean):   2.59 cm AV Area (VTI):     2.56 cm AV Vmax:           125.00 cm/s AV Vmean:          92.000 cm/s AV VTI:            0.232 m AV Peak Grad:      6.2 mmHg AV Mean Grad:      4.0 mmHg LVOT Vmax:         91.40 cm/s LVOT Vmean:        62.700 cm/s LVOT VTI:          0.156 m LVOT/AV VTI ratio: 0.67 AI PHT:            1716 msec  AORTA Ao Root diam: 3.90 cm MITRAL VALVE  TRICUSPID VALVE MV Area (PHT): 3.27 cm    TR Peak grad:   19.9 mmHg MV Decel Time: 232 msec    TR Vmax:        223.00 cm/s MV E velocity: 47.30 cm/s MV A velocity: 68.60 cm/s  SHUNTS MV E/A ratio:  0.69        Systemic VTI:  0.16 m                            Systemic Diam: 2.20 cm Loralie Champagne MD Electronically signed by Loralie Champagne MD Signature Date/Time: 12/26/2019/3:59:06 PM    Final         Scheduled Meds: .  stroke: mapping our early stages of recovery book   Does not apply Once  . amLODipine  10 mg Oral Daily  . aspirin EC  81 mg Oral Daily  . atorvastatin  40 mg Oral Daily  . Chlorhexidine Gluconate Cloth  6 each Topical Daily  . chlorthalidone  25 mg Oral Daily  . clopidogrel  75 mg Oral Daily  . enoxaparin (LOVENOX) injection  40 mg Subcutaneous Q24H  . lisinopril  10 mg Oral Daily   Continuous Infusions:   LOS: 4 days    Time spent: 30 minutes     Barb Merino, MD [facility] Pager [phone]

## 2019-12-28 NOTE — Progress Notes (Signed)
Inpatient Rehab Admissions Coordinator:   Still have not heard from pt's family after leaving multiple messages.  Discussed with TOC team, who states pt said his son sleeps during the day.  No answer from daughter either.  I also have no beds available for this patient, likely until next week.  Unfortunately, will need to pursue SNF at this time, unless I can confirm support at home.  I will follow along from a distance.    Estill Dooms, PT, DPT Admissions Coordinator [phone] 12/28/19  11:03 AM

## 2019-12-28 NOTE — NC FL2 (Signed)
[place] MEDICAID FL2 LEVEL OF CARE SCREENING TOOL     IDENTIFICATION  Patient Name: KEYAN FOLSON Birthdate: 07/16/1951 Sex: male Admission Date (Current Location): 12/24/2019  [facility] and [place] Number:  Producer, television/film/video and Address:  [facility], [address] [place], [place] [address]      Provider Number: [id number]  Attending Physician Name and Address:  Dorcas Carrow, MD  Relative Name and Phone Number:  Laqueta Jean, [phone]    Current Level of Care: Hospital Recommended Level of Care: Skilled Nursing Facility Prior Approval Number:    Date Approved/Denied:   PASRR Number: [id number]  Discharge Plan: SNF    Current Diagnoses: Patient Active Problem List   Diagnosis Date Noted  . Cerebral thrombosis with cerebral infarction 12/25/2019  . Hypertensive emergency 12/24/2019  . Hypokalemia 11/07/2014  . Hypertensive urgency 11/07/2014  . Elevated blood pressure 11/06/2014  . Syncope, near 11/06/2014  . GI bleeding 11/06/2014  . Hand laceration 11/06/2014    Orientation RESPIRATION BLADDER Height & Weight     Self, Time, Situation, Place  Normal Continent Weight: 173 lb 4.5 oz (78.6 kg) Height:  6\' 2"  (188 cm)  BEHAVIORAL SYMPTOMS/MOOD NEUROLOGICAL BOWEL NUTRITION STATUS      Continent Diet(See discharge summary)  AMBULATORY STATUS COMMUNICATION OF NEEDS Skin   Limited Assist Verbally Normal                       Personal Care Assistance Level of Assistance  Bathing, Feeding, Dressing Bathing Assistance: Limited assistance Feeding assistance: Independent Dressing Assistance: Limited assistance     Functional Limitations Info  Sight, Hearing, Speech Sight Info: Adequate Hearing Info: Adequate Speech Info: Impaired(Slurred Speech)    SPECIAL CARE FACTORS FREQUENCY  PT (By licensed PT), OT (By licensed OT), Speech therapy     PT Frequency: 5x week OT Frequency: 5x week     Speech Therapy  Frequency: 2x week      Contractures Contractures Info: Not present    Additional Factors Info  Code Status, Allergies Code Status Info: Full Allergies Info: Asprin           Current Medications (12/28/2019):  This is the current hospital active medication list Current Facility-Administered Medications  Medication Dose Route Frequency Provider Last Rate Last Admin  .  stroke: mapping our early stages of recovery book   Does not apply Once 12/30/2019, MD   Stopped at 12/24/19 1910  . amLODipine (NORVASC) tablet 10 mg  10 mg Oral Daily 12/26/19, MD   10 mg at 12/28/19 0951  . aspirin EC tablet 81 mg  81 mg Oral Daily 12/30/19, MD   81 mg at 12/28/19 0951  . atorvastatin (LIPITOR) tablet 40 mg  40 mg Oral Daily 12/30/19, MD   40 mg at 12/28/19 0951  . Chlorhexidine Gluconate Cloth 2 % PADS 6 each  6 each Topical Daily 12/30/19, MD   6 each at 12/27/19 1135  . chlorthalidone (HYGROTON) tablet 25 mg  25 mg Oral Daily 12/29/19, MD   25 mg at 12/28/19 0951  . clopidogrel (PLAVIX) tablet 75 mg  75 mg Oral Daily 12/30/19, MD   75 mg at 12/28/19 0951  . docusate sodium (COLACE) capsule 100 mg  100 mg Oral BID PRN Agarwala, 12/30/19, MD      . enoxaparin (LOVENOX) injection 40 mg  40 mg Subcutaneous Q24H Daleen Bo, MD  40 mg at 12/27/19 2149  . lisinopril (ZESTRIL) tablet 10 mg  10 mg Oral Daily Kipp Brood, MD   10 mg at 12/28/19 0951  . polyethylene glycol (MIRALAX / GLYCOLAX) packet 17 g  17 g Oral Daily PRN Agarwala, Einar Grad, MD      . QUEtiapine (SEROQUEL) tablet 12.5 mg  12.5 mg Oral BID BM & HS PRN Donzetta Starch, NP         Discharge Medications: Please see discharge summary for a list of discharge medications.  Relevant Imaging Results:  Relevant Lab Results:   Additional Information SS# [place], [place] Work

## 2019-12-28 NOTE — Progress Notes (Signed)
  Speech Language Pathology Treatment: Cognitive-Linquistic  Patient Details Name: Brandon Perkins MRN: [id number] DOB: 11-Sep-1950 Today's Date: 12/28/2019 Time: 8366-2947 SLP Time Calculation (min) (ACUTE ONLY): 17 min  Assessment / Plan / Recommendation Clinical Impression  Pt was seen for skilled ST intervention targeting goals for intelligibility of speech, functional recall, and problem solving. Pt speech is difficult to understand, due to minimal oral movement, low volume, and rapid rate of speech. SLP reviewed and demonstrated strategies for maximizing intelligibility. Pt exhibited minimal carryover of strategies, even at single word level. Pt was provided with functional situations, and was able to provide reasonable responses (use of call bell, not getting up alone to avoid falls). Pt appears to have limited awareness of his deficits and their functional impact. Continued ST intervention will be beneficial at next level of care.    HPI HPI: Pt is a 69 yo male presenting with R sided weakness with aphasia and slurred speech. 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 PMH: HTN, HTN retinopathy, GIB      SLP Plan  Continue with current plan of care       Recommendations   Continued ST at next venue                Follow up Recommendations: 24 hour supervision/assistance;Other (comment)(cont'd ST at next venue) SLP Visit Diagnosis: Dysarthria and anarthria (R47.1);Cognitive communication deficit (R41.841) Plan: Continue with current plan of care       GO               Wilian Kwong B. Murvin Natal, [facility], CCC-SLP Speech Language Pathologist Office: [phone] Pager: [phone]  Leigh Aurora 12/28/2019, 3:11 PM

## 2019-12-29 ENCOUNTER — Inpatient Hospital Stay (HOSPITAL_COMMUNITY)
Admission: RE | Admit: 2019-12-29 | Discharge: 2020-01-09 | DRG: 057 | Disposition: A | Discharge status: Home Health | Payer: Medicare Other | Source: Intra-hospital | Attending: Physical Medicine & Rehabilitation | Admitting: Physical Medicine & Rehabilitation

## 2019-12-29 ENCOUNTER — Other Ambulatory Visit: Payer: Self-pay

## 2019-12-29 ENCOUNTER — Encounter (HOSPITAL_COMMUNITY): Payer: Self-pay | Admitting: Physical Medicine & Rehabilitation

## 2019-12-29 DIAGNOSIS — I10 Essential (primary) hypertension: Secondary | ICD-10-CM | POA: Diagnosis present

## 2019-12-29 DIAGNOSIS — Z820 Family history of epilepsy and other diseases of the nervous system: Secondary | ICD-10-CM

## 2019-12-29 DIAGNOSIS — Z823 Family history of stroke: Secondary | ICD-10-CM

## 2019-12-29 DIAGNOSIS — I69328 Other speech and language deficits following cerebral infarction: Secondary | ICD-10-CM

## 2019-12-29 DIAGNOSIS — N179 Acute kidney failure, unspecified: Secondary | ICD-10-CM

## 2019-12-29 DIAGNOSIS — Z800 Family history of malignant neoplasm of digestive organs: Secondary | ICD-10-CM | POA: Diagnosis not present

## 2019-12-29 DIAGNOSIS — Z833 Family history of diabetes mellitus: Secondary | ICD-10-CM | POA: Diagnosis not present

## 2019-12-29 DIAGNOSIS — I69354 Hemiplegia and hemiparesis following cerebral infarction affecting left non-dominant side: Principal | ICD-10-CM

## 2019-12-29 DIAGNOSIS — I69322 Dysarthria following cerebral infarction: Secondary | ICD-10-CM

## 2019-12-29 DIAGNOSIS — Z86718 Personal history of other venous thrombosis and embolism: Secondary | ICD-10-CM

## 2019-12-29 DIAGNOSIS — I63541 Cerebral infarction due to unspecified occlusion or stenosis of right cerebellar artery: Secondary | ICD-10-CM | POA: Diagnosis not present

## 2019-12-29 DIAGNOSIS — Z886 Allergy status to analgesic agent status: Secondary | ICD-10-CM

## 2019-12-29 DIAGNOSIS — I6330 Cerebral infarction due to thrombosis of unspecified cerebral artery: Secondary | ICD-10-CM

## 2019-12-29 DIAGNOSIS — R1314 Dysphagia, pharyngoesophageal phase: Secondary | ICD-10-CM | POA: Diagnosis present

## 2019-12-29 DIAGNOSIS — I69391 Dysphagia following cerebral infarction: Secondary | ICD-10-CM

## 2019-12-29 DIAGNOSIS — I69320 Aphasia following cerebral infarction: Secondary | ICD-10-CM | POA: Diagnosis not present

## 2019-12-29 DIAGNOSIS — E785 Hyperlipidemia, unspecified: Secondary | ICD-10-CM | POA: Diagnosis present

## 2019-12-29 LAB — CBC
HCT: 42.7 % (ref 39.0–52.0)
Hemoglobin: 13.4 g/dL (ref 13.0–17.0)
MCH: 26 pg (ref 26.0–34.0)
MCHC: 31.4 g/dL (ref 30.0–36.0)
MCV: 82.9 fL (ref 80.0–100.0)
Platelets: 242 10*3/uL (ref 150–400)
RBC: 5.15 MIL/uL (ref 4.22–5.81)
RDW: 15.7 % — ABNORMAL HIGH (ref 11.5–15.5)
WBC: 6.1 10*3/uL (ref 4.0–10.5)
nRBC: 0 % (ref 0.0–0.2)

## 2019-12-29 LAB — CREATININE, SERUM
Creatinine, Ser: 1.49 mg/dL — ABNORMAL HIGH (ref 0.61–1.24)
GFR calc Af Amer: 55 mL/min — ABNORMAL LOW (ref >60)
GFR calc non Af Amer: 48 mL/min — ABNORMAL LOW (ref >60)

## 2019-12-29 MED ORDER — ASPIRIN EC 81 MG PO TBEC
81.0000 mg | DELAYED_RELEASE_TABLET | Freq: Every day | ORAL | Status: DC
Start: 2019-12-29 — End: 2021-01-14

## 2019-12-29 MED ORDER — CHLORTHALIDONE 25 MG PO TABS: 25.0000 mg | ORAL_TABLET | Freq: Every day | ORAL | Status: DC

## 2019-12-29 MED ORDER — ATORVASTATIN CALCIUM 40 MG PO TABS: 40.0000 mg | ORAL_TABLET | Freq: Every day | ORAL | Status: DC

## 2019-12-29 MED ORDER — AMLODIPINE BESYLATE 10 MG PO TABS: 10.0000 mg | ORAL_TABLET | Freq: Every day | ORAL | 0 refills | Status: DC

## 2019-12-29 MED ORDER — CHLORTHALIDONE 25 MG PO TABS
25.0000 mg | ORAL_TABLET | Freq: Every day | ORAL | Status: DC
  Administered 2019-12-30 – 2020-01-09 (×11): 25 mg via ORAL
  Filled 2019-12-29 (×12): qty 1

## 2019-12-29 MED ORDER — QUETIAPINE FUMARATE 25 MG PO TABS
12.5000 mg | ORAL_TABLET | Freq: Two times a day (BID) | ORAL | Status: DC | PRN
  Administered 2020-01-04 – 2020-01-07 (×3): 12.5 mg via ORAL
  Filled 2019-12-29 (×3): qty 1

## 2019-12-29 MED ORDER — ENOXAPARIN SODIUM 40 MG/0.4ML ~~LOC~~ SOLN
40.0000 mg | SUBCUTANEOUS | Status: DC
  Administered 2019-12-29 – 2020-01-08 (×11): 40 mg via SUBCUTANEOUS
  Filled 2019-12-29 (×11): qty 0.4

## 2019-12-29 MED ORDER — LISINOPRIL 10 MG PO TABS: 10.0000 mg | ORAL_TABLET | Freq: Every day | ORAL | Status: DC

## 2019-12-29 MED ORDER — DOCUSATE SODIUM 100 MG PO CAPS
100.0000 mg | ORAL_CAPSULE | Freq: Two times a day (BID) | ORAL | Status: DC | PRN
  Filled 2019-12-29: qty 1

## 2019-12-29 MED ORDER — POLYETHYLENE GLYCOL 3350 17 G PO PACK
17.0000 g | PACK | Freq: Every day | ORAL | Status: DC | PRN
  Filled 2019-12-29: qty 1

## 2019-12-29 MED ORDER — AMLODIPINE BESYLATE 10 MG PO TABS
10.0000 mg | ORAL_TABLET | Freq: Every day | ORAL | Status: DC
  Administered 2019-12-30 – 2020-01-09 (×11): 10 mg via ORAL
  Filled 2019-12-29 (×11): qty 1

## 2019-12-29 MED ORDER — QUETIAPINE FUMARATE 25 MG PO TABS: 12.5000 mg | ORAL_TABLET | Freq: Two times a day (BID) | ORAL | Status: DC | PRN

## 2019-12-29 MED ORDER — LISINOPRIL 10 MG PO TABS
10.0000 mg | ORAL_TABLET | Freq: Every day | ORAL | Status: DC
  Administered 2019-12-30 – 2020-01-01 (×3): 10 mg via ORAL
  Filled 2019-12-29 (×3): qty 1

## 2019-12-29 MED ORDER — ENOXAPARIN SODIUM 40 MG/0.4ML ~~LOC~~ SOLN: 40.0000 mg | SUBCUTANEOUS | Status: DC

## 2019-12-29 MED ORDER — ASPIRIN EC 81 MG PO TBEC
81.0000 mg | DELAYED_RELEASE_TABLET | Freq: Every day | ORAL | Status: DC
  Administered 2019-12-30 – 2020-01-09 (×11): 81 mg via ORAL
  Filled 2019-12-29 (×11): qty 1

## 2019-12-29 MED ORDER — CLOPIDOGREL BISULFATE 75 MG PO TABS
75.0000 mg | ORAL_TABLET | Freq: Every day | ORAL | Status: DC
  Administered 2019-12-30 – 2020-01-09 (×11): 75 mg via ORAL
  Filled 2019-12-29 (×11): qty 1

## 2019-12-29 MED ORDER — CLOPIDOGREL BISULFATE 75 MG PO TABS: 75.0000 mg | ORAL_TABLET | Freq: Every day | ORAL | 0 refills | Status: DC

## 2019-12-29 MED ORDER — ATORVASTATIN CALCIUM 40 MG PO TABS
40.0000 mg | ORAL_TABLET | Freq: Every day | ORAL | Status: DC
  Administered 2019-12-30 – 2020-01-09 (×11): 40 mg via ORAL
  Filled 2019-12-29 (×11): qty 1

## 2019-12-29 NOTE — Progress Notes (Signed)
Physical Medicine and Rehabilitation Consult Reason for Consult: Decreased functional ability with slurred speech Referring Physician: Critical care     HPI: Brandon Perkins is a 69 y.o. right-handed male with history of hypertension.  Per chart review patient lives alone independent with assistive device prior to admission.  1 level home 3 steps to entry.  Presented 12/24/2019 with slurred speech and altered mental status.  Elevated blood pressure systolic in the 200s placed on Cleviprex.  Cranial CT scan negative for acute changes.  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  Admission chemistries with potassium 5.8, alcohol negative, SARS coronavirus negative, urinalysis negative nitrite.  Echocardiogram pending.  Currently on aspirin and Plavix for CVA prophylaxis.  Subcutaneous Lovenox for DVT prophylaxis.  Close monitoring of blood pressure.  Tolerating a regular consistency diet.  Therapy evaluations completed with recommendations of physical medicine rehab consult.     Review of Systems  Constitutional: Negative for chills and fever.  HENT: Negative for hearing loss.   Eyes: Negative for blurred vision and double vision.  Respiratory: Negative for cough and shortness of breath.   Cardiovascular: Negative for chest pain, palpitations and leg swelling.  Gastrointestinal: Positive for constipation. Negative for heartburn, nausea and vomiting.  Genitourinary: Negative for dysuria, flank pain and hematuria.  Musculoskeletal: Positive for myalgias.  Skin: Negative for rash.  Neurological: Positive for speech change and weakness.  All other systems reviewed and are negative.       Past Medical History:  Diagnosis Date  . Blood clot in vein      left arm vein blood clot, not  know the detail, treated with ASA per pt  . Cataract      OU  . Hypertension    . Hypertensive retinopathy      OU    History reviewed. No pertinent surgical history.      Family History  Problem Relation Age of Onset  . Dementia Mother    . Colon cancer Father    . Stroke Sister          Sister had brain aneurysm  . Diabetes Brother      Social History:  reports that he has never smoked. He has never used smokeless tobacco. He reports that he does not drink alcohol or use drugs. Allergies:       Allergies  Allergen Reactions  . Aspirin        When takes a lot of aspirin-- reaction unknown          Medications Prior to Admission  Medication Sig Dispense Refill  . amLODipine (NORVASC) 10 MG tablet Take 1 tablet (10 mg total) by mouth daily. (Patient not taking: Reported on 12/24/2019) 30 tablet 0  . aspirin EC 81 MG tablet Take 1 tablet (81 mg total) by mouth daily. (Patient not taking: Reported on 12/24/2019)      . hydrochlorothiazide (HYDRODIURIL) 25 MG tablet Take 1 tablet (25 mg total) by mouth daily. (Patient not taking: Reported on 12/24/2019) 30 tablet 0  . oxyCODONE-acetaminophen (PERCOCET/ROXICET) 5-325 MG per tablet Take 1 tablet by mouth every 4 (four) hours as needed for moderate pain. (Patient not taking: Reported on 12/24/2019) 15 tablet 0      Home: Home Living Family/patient expects to  be discharged to:: Private residence Living Arrangements: Alone Available Help at Discharge: Family Type of Home: House Home Access: Stairs to enter Secretary/administrator of Steps: 3 Entrance Stairs-Rails: Right Home Layout: One level Bathroom Shower/Tub: Engineer, manufacturing systems: Standard Home Equipment: Medical laboratory scientific officer - single point  Lives With: Alone  Functional History: Prior Function Level of Independence: Independent with assistive device(s) Comments: pt reports using SPC for mobility tasks, reports friend typically assists with driving/getting groceries (reports he  doesn't like going to the store). Was performing iADL tasks at home  Functional Status:  Mobility: Bed Mobility Overal bed mobility: Needs Assistance Bed Mobility: Supine to Sit Supine to sit: Min guard, HOB elevated General bed mobility comments: for lines and safety Transfers Overall transfer level: Needs assistance Equipment used: Rolling walker (2 wheeled) Transfers: Sit to/from Stand Sit to Stand: Min assist, +2 physical assistance General transfer comment: steadying assist throughout, VCs for safe hand placement initially  Ambulation/Gait Ambulation/Gait assistance: Min assist Gait Distance (Feet): 80 Feet Assistive device: Rolling walker (2 wheeled) Gait Pattern/deviations: Step-through pattern, Decreased stride length, Trunk flexed, Wide base of support General Gait Details: cues for forward gaze and feet inside walker and for assist with turns with walker   ADL: ADL Overall ADL's : Needs assistance/impaired Eating/Feeding: NPO Grooming: Wash/dry face, Set up, Sitting Upper Body Bathing: Set up, Min guard, Sitting Lower Body Bathing: Minimal assistance, +2 for safety/equipment, Sit to/from stand Upper Body Dressing : Set up, Min guard, Sitting Lower Body Dressing: Minimal assistance, +2 for safety/equipment, Sit to/from stand Lower Body Dressing Details (indicate cue type and reason): pt was able to don socks seated EOB via figure 4 Toilet Transfer: Minimal assistance, +2 for safety/equipment, Ambulation, RW Toilet Transfer Details (indicate cue type and reason): simulated via transfer to recliner, room and hallway level mobility Toileting- Clothing Manipulation and Hygiene: Minimal assistance, Sit to/from stand, Sitting/lateral lean Functional mobility during ADLs: Minimal assistance, +2 for safety/equipment, Rolling walker General ADL Comments: pt with generalized weakness, decreased standing balance and impaired cognition   Cognition: Cognition Overall Cognitive  Status: Impaired/Different from baseline Arousal/Alertness: Awake/alert Orientation Level: Oriented X4 Attention: Selective Selective Attention: Impaired Selective Attention Impairment: Verbal basic Memory: Impaired Memory Impairment: Retrieval deficit, Decreased recall of new information Awareness: Appears intact(intellectual awareness appears to be intact) Problem Solving: Impaired Problem Solving Impairment: Verbal complex, Verbal basic Cognition Arousal/Alertness: Awake/alert Behavior During Therapy: WFL for tasks assessed/performed Overall Cognitive Status: Impaired/Different from baseline Area of Impairment: Following commands, Awareness, Problem solving, Attention, Orientation Orientation Level: Disoriented to, Time(" 2019") Current Attention Level: Selective Following Commands: Follows one step commands with increased time, Follows one step commands consistently Awareness: Emergent Problem Solving: Decreased initiation, Requires verbal cues, Slow processing General Comments: pt often having to repeat himself given dysarthric speech, A&O x3   Blood pressure (!) 151/69, pulse 62, temperature 99 F (37.2 C), temperature source Oral, resp. rate 11, height 6\' 2"  (1.88 m), weight 78.6 kg, SpO2 96 %. Physical Exam  Constitutional: No distress.  HENT:  Head: Normocephalic and atraumatic.  Eyes: Pupils are equal, round, and reactive to light.  Left eye lags with tracking  Cardiovascular: Normal rate.  Respiratory: Effort normal.  GI: Soft.  Musculoskeletal:        General: Normal range of motion.     Cervical back: Normal range of motion.  Neurological:  Patient is alert in no acute distress.  Makes eye contact with examiner.  Fairly dysarthric. Speech low volume. fair insight and awareness. Recalls biographical  information. Slightly slow processing.  Mild right arm and leg limb ataxia. Strength 4/5 UE prox to distal. LE: 3+/5 HF, KE and 4/5 ADF/PF. No focal sensory abnl.    Skin: Skin is warm. He is not diaphoretic.  Psychiatric: He has a normal mood and affect. His behavior is normal.      Lab Results Last 24 Hours       Results for orders placed or performed during the hospital encounter of 12/24/19 (from the past 24 hour(s))  Magnesium     Status: None    Collection Time: 12/24/19  6:49 PM  Result Value Ref Range    Magnesium 1.7 1.7 - 2.4 mg/dL  SARS Coronavirus 2 by RT PCR (hospital order, performed in [place] hospital lab) Nasopharyngeal Nasopharyngeal Swab     Status: None    Collection Time: 12/24/19  6:49 PM    Specimen: Nasopharyngeal Swab  Result Value Ref Range    SARS Coronavirus 2 NEGATIVE NEGATIVE  Basic metabolic panel     Status: Abnormal    Collection Time: 12/24/19  6:49 PM  Result Value Ref Range    Sodium 139 135 - 145 mmol/L    Potassium 3.9 3.5 - 5.1 mmol/L    Chloride 103 98 - 111 mmol/L    CO2 25 22 - 32 mmol/L    Glucose, Bld 124 (H) 70 - 99 mg/dL    BUN 9 8 - 23 mg/dL    Creatinine, Ser 0.90 0.61 - 1.24 mg/dL    Calcium 9.3 8.9 - 10.3 mg/dL    GFR calc non Af Amer >60 >60 mL/min    GFR calc Af Amer >60 >60 mL/min    Anion gap 11 5 - 15  MRSA PCR Screening     Status: None    Collection Time: 12/24/19 10:14 PM    Specimen: Nasal Mucosa; Nasopharyngeal  Result Value Ref Range    MRSA by PCR NEGATIVE NEGATIVE  Basic metabolic panel     Status: Abnormal    Collection Time: 12/25/19  7:19 AM  Result Value Ref Range    Sodium 140 135 - 145 mmol/L    Potassium 3.6 3.5 - 5.1 mmol/L    Chloride 99 98 - 111 mmol/L    CO2 28 22 - 32 mmol/L    Glucose, Bld 120 (H) 70 - 99 mg/dL    BUN 10 8 - 23 mg/dL    Creatinine, Ser 0.94 0.61 - 1.24 mg/dL    Calcium 9.4 8.9 - 10.3 mg/dL    GFR calc non Af Amer >60 >60 mL/min    GFR calc Af Amer >60 >60 mL/min    Anion gap 13 5 - 15  Hemoglobin A1c     Status: Abnormal    Collection Time: 12/25/19  7:19 AM  Result Value Ref Range    Hgb A1c MFr Bld 6.0 (H) 4.8 - 5.6 %    Mean  Plasma Glucose 125.5 mg/dL  HIV Antibody (routine testing w rflx)     Status: None    Collection Time: 12/25/19  7:19 AM  Result Value Ref Range    HIV Screen 4th Generation wRfx Non Reactive Non Reactive  Lipid panel     Status: Abnormal    Collection Time: 12/25/19  7:19 AM  Result Value Ref Range    Cholesterol 202 (H) 0 - 200 mg/dL    Triglycerides 51 <150 mg/dL    HDL 64 >40 mg/dL    Total  CHOL/HDL Ratio 3.2 RATIO    VLDL 10 0 - 40 mg/dL    LDL Cholesterol 161 (H) 0 - 99 mg/dL       Imaging Results (Last 48 hours)  CT ANGIO HEAD W OR WO CONTRAST   Result Date: 12/24/2019 CLINICAL DATA:  69 year old male with 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 EXAM: CT ANGIOGRAPHY HEAD AND NECK TECHNIQUE: Multidetector CT imaging of the head and neck was performed using the standard protocol during bolus administration of intravenous contrast. Multiplanar CT image reconstructions and MIPs were obtained to evaluate the vascular anatomy. Carotid stenosis measurements (when applicable) are obtained utilizing NASCET criteria, using the distal internal carotid diameter as the denominator. CONTRAST:  75mL OMNIPAQUE IOHEXOL 350 MG/ML SOLN COMPARISON:  Head CT and MRI earlier today. FINDINGS: CTA NECK Skeleton: Advanced cervical spine disc and endplate degeneration with reversal of lordosis. Carious posterior right maxillary dentition. No acute osseous abnormality identified. Upper chest: Negative. Other neck: No acute findings. Aortic arch: 3 vessel arch configuration with mild for age arch atherosclerosis. Right carotid system: Tortuous brachiocephalic artery without plaque or stenosis. Normal proximal right CCA. There is low-density soft plaque within the medial right CCA at the level of the thyroid cartilage on series 5, image 126, but no significant stenosis. Capacious right carotid bifurcation with minimal calcified plaque of right bulb. Tortuous right  ICA just below the skull base without stenosis. Left carotid system: Subtle soft plaque in the left CCA which is mildly tortuous. Mild calcified plaque in the posterior left ICA bulb without stenosis. Similar left ICA tortuosity at C1-C2. Vertebral arteries: Mildly tortuous proximal right subclavian artery without plaque or stenosis. Normal right vertebral artery origin. The right vertebral is patent to the skull base without plaque or stenosis. No proximal left subclavian artery plaque or stenosis. Normal left vertebral artery origin. Codominant left vertebral is patent to the skull base without plaque or stenosis. CTA HEAD Posterior circulation: Codominant V4 segments with minimal calcified plaque. No stenosis on the left. There is mild right V4 segment stenosis (series 7, image 145) which may be related to soft plaque. Both PICA origins remain patent. Patent vertebrobasilar junction. Patent and normal proximal and mid basilar artery. However, the distal basilar tapers, with patent but irregular basilar tip, highly irregular bilateral SCA and PCA origins (series 11, image 22). There is at least partial patency of the right SCA visible on series 8, image 102, but all 4 basilar artery branches in this region are highly irregular. The P2 and P3 segments appear more normal, although remain moderately irregular. No PCA branch occlusion is identified. Anterior circulation: Both ICA siphons are patent with mild ectasia, minimal calcified plaque and no stenosis. Normal ophthalmic artery origins. Posterior communicating arteries are diminutive or absent. Patent carotid termini but severe irregularity and stenosis at both ACA origins rim anise int of that at the basilar tip (series 11, image 19). Both MCA origins are more normal. Bilateral ACAs are patent but diminutive and irregular. There is moderate to severe irregularity and stenosis of mid and distal A2 segments (series 12, image 20). Mildly irregular left MCA M1  without stenosis. Patent left MCA bifurcation. Mildly irregular left MCA branches. Tapered and irregular right MCA M1 which becomes moderately irregular and stenotic at the right MCA bifurcation (series 11, image 17). However, the major right MCA branches remain patent although moderately irregular throughout (series 12, image 13). Venous sinuses: Early contrast timing, not  well evaluated. Anatomic variants: None. Review of the MIP images confirms the above findings IMPRESSION: 1. 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 2. But negative for any complete occlusion; and the Right SCA remains at least partially patent. 3. Also widespread irregularity and stenoses of the second order intracranial branches, including moderate stenosis at the Right MCA bifurcation. 4. The above is favored due to advanced intracranial atherosclerosis, despite only minimal/mild atherosclerosis in the neck. There is no significant carotid or vertebral artery stenosis. Salient findings were communicated to Dr. Otelia Limes at 8:16 pm on 12/24/2019 by text page via the [facility] messaging system. Electronically Signed   By: Odessa Fleming M.D.   On: 12/24/2019 20:16    CT Head Wo Contrast   Result Date: 12/24/2019 CLINICAL DATA:  Focal neuro deficit.  Aphasia.  Stroke suspected. EXAM: CT HEAD WITHOUT CONTRAST TECHNIQUE: Contiguous axial images were obtained from the base of the skull through the vertex without intravenous contrast. COMPARISON:  None. FINDINGS: Brain: There is no evidence of acute intracranial hemorrhage, mass lesion, brain edema or extra-axial fluid collection. There is atrophy with diffuse prominence of the ventricles and subarachnoid spaces. Cavum septum pellucidum and vergae noted incidentally. Multifocal encephalomalacia is present, most notably in the left occipital lobe and in both cerebellar hemispheres. The right cerebellar  component is slightly less distinct and could be a late subacute infarct. Old lacunar infarcts are present within the right lentiform nuclei and pons. There are chronic small vessel ischemic changes in the periventricular white matter. There is no CT evidence of acute cortical infarction. Vascular: Intracranial vascular calcifications. No hyperdense vessel identified. Skull: Negative for fracture or suspicious focal lesion. Sinuses/Orbits: Left maxillary sinus polyp or mucous retention cysts. The visualized paranasal sinuses, mastoid air cells and middle ears are otherwise clear. No significant orbital findings. Other: None. IMPRESSION: 1. No CT evidence of acute cortical infarction or hemorrhage. 2. Multifocal encephalomalacia as described. Right cerebellar component could reflect a late subacute infarct. 3. Atrophy and chronic small vessel ischemic changes. Electronically Signed   By: Carey Bullocks M.D.   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 EXAM: CT ANGIOGRAPHY HEAD AND NECK TECHNIQUE: Multidetector CT imaging of the head and neck was performed using the standard protocol during bolus administration of intravenous contrast. Multiplanar CT image reconstructions and MIPs were obtained to evaluate the vascular anatomy. Carotid stenosis measurements (when applicable) are obtained utilizing NASCET criteria, using the distal internal carotid diameter as the denominator. CONTRAST:  52mL OMNIPAQUE IOHEXOL 350 MG/ML SOLN COMPARISON:  Head CT and MRI earlier today. FINDINGS: CTA NECK Skeleton: Advanced cervical spine disc and endplate degeneration with reversal of lordosis. Carious posterior right maxillary dentition. No acute osseous abnormality identified. Upper chest: Negative. Other neck: No acute findings. Aortic arch: 3 vessel arch  configuration with mild for age arch atherosclerosis. Right carotid system: Tortuous brachiocephalic artery without plaque or stenosis. Normal proximal right CCA. There is low-density soft plaque within the medial right CCA at the level of the thyroid cartilage on series 5, image 126, but no significant stenosis. Capacious right carotid bifurcation with minimal calcified plaque of right bulb. Tortuous right ICA just below the skull base without stenosis. Left carotid system: Subtle  soft plaque in the left CCA which is mildly tortuous. Mild calcified plaque in the posterior left ICA bulb without stenosis. Similar left ICA tortuosity at C1-C2. Vertebral arteries: Mildly tortuous proximal right subclavian artery without plaque or stenosis. Normal right vertebral artery origin. The right vertebral is patent to the skull base without plaque or stenosis. No proximal left subclavian artery plaque or stenosis. Normal left vertebral artery origin. Codominant left vertebral is patent to the skull base without plaque or stenosis. CTA HEAD Posterior circulation: Codominant V4 segments with minimal calcified plaque. No stenosis on the left. There is mild right V4 segment stenosis (series 7, image 145) which may be related to soft plaque. Both PICA origins remain patent. Patent vertebrobasilar junction. Patent and normal proximal and mid basilar artery. However, the distal basilar tapers, with patent but irregular basilar tip, highly irregular bilateral SCA and PCA origins (series 11, image 22). There is at least partial patency of the right SCA visible on series 8, image 102, but all 4 basilar artery branches in this region are highly irregular. The P2 and P3 segments appear more normal, although remain moderately irregular. No PCA branch occlusion is identified. Anterior circulation: Both ICA siphons are patent with mild ectasia, minimal calcified plaque and no stenosis. Normal ophthalmic artery origins. Posterior communicating  arteries are diminutive or absent. Patent carotid termini but severe irregularity and stenosis at both ACA origins rim anise int of that at the basilar tip (series 11, image 19). Both MCA origins are more normal. Bilateral ACAs are patent but diminutive and irregular. There is moderate to severe irregularity and stenosis of mid and distal A2 segments (series 12, image 20). Mildly irregular left MCA M1 without stenosis. Patent left MCA bifurcation. Mildly irregular left MCA branches. Tapered and irregular right MCA M1 which becomes moderately irregular and stenotic at the right MCA bifurcation (series 11, image 17). However, the major right MCA branches remain patent although moderately irregular throughout (series 12, image 13). Venous sinuses: Early contrast timing, not well evaluated. Anatomic variants: None. Review of the MIP images confirms the above findings IMPRESSION: 1. 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 2. But negative for any complete occlusion; and the Right SCA remains at least partially patent. 3. Also widespread irregularity and stenoses of the second order intracranial branches, including moderate stenosis at the Right MCA bifurcation. 4. The above is favored due to advanced intracranial atherosclerosis, despite only minimal/mild atherosclerosis in the neck. There is no significant carotid or vertebral artery stenosis. Salient findings were communicated to Dr. Otelia Limes at 8:16 pm on 12/24/2019 by text page via the [facility] messaging system. Electronically Signed   By: Odessa Fleming M.D.   On: 12/24/2019 20:16    MR BRAIN WO CONTRAST   Result Date: 12/24/2019 CLINICAL DATA:  69 year old male with aphasia, neurologic deficit. EXAM: MRI HEAD WITHOUT CONTRAST TECHNIQUE: Multiplanar, multiecho pulse sequences of the brain and surrounding structures were obtained without intravenous contrast.  COMPARISON:  Head CT 1255 hours today. FINDINGS: Brain: Patchy and confluent restricted diffusion in the right superior cerebellar artery territory (series 11, image 50). Associated T2 and FLAIR hyperintensity, T1 hypointensity, no evidence of acute hemorrhage. No posterior fossa mass effect. Superimposed chronic infarcts in the contralateral left cerebellum, bilateral pons, bilateral deep gray nuclei, bilateral corona radiata. Chronic cortical encephalomalacia in the left parietal and occipital lobes. Occasional chronic micro hemorrhages including in the  left brainstem. No midline shift, mass effect, evidence of mass lesion, ventriculomegaly, extra-axial collection or acute intracranial hemorrhage. Cervicomedullary junction and pituitary are within normal limits. Cavum septum pellucidum (normal variant). Vascular: Major intracranial vascular flow voids are preserved. Generalized intracranial artery tortuosity. Skull and upper cervical spine: Partially visible cervical spine degeneration with evidence of mild degenerative spinal stenosis at C3-C4 (series 13, image 13). Visualized bone marrow signal is within normal limits. Sinuses/Orbits: Negative orbits. Mild maxillary sinus mucous retention cysts. Other: Mastoids are clear. Visible internal auditory structures appear normal. Scalp and face soft tissues appear negative. IMPRESSION: 1. 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 No associated hemorrhage or mass effect. 2. Underlying advanced chronic ischemic disease. 3. Partially visible cervical spine degeneration with mild spinal stenosis at C3-C4. Electronically Signed   By: Odessa FlemingH  Hall M.D.   On: 12/24/2019 16:58    DG Chest Portable 1 View   Result Date: 12/24/2019 CLINICAL DATA:  Hypertension.  Slurred speech. EXAM: PORTABLE CHEST 1 VIEW COMPARISON:  None. FINDINGS: Assessment heart size is limited due to portable technique. The  heart may be borderline to mildly enlarged. The hila, mediastinum, lungs, and pleura are otherwise unremarkable. IMPRESSION: No acute abnormalities. Electronically Signed   By: Gerome Samavid  Williams III M.D   On: 12/24/2019 13:29         Assessment/Plan: Diagnosis: right superior cerebellar artery infarct 1. Does the need for close, 24 hr/day medical supervision in concert with the patient's rehab needs make it unreasonable for this patient to be served in a less intensive setting? Yes 2. Co-Morbidities requiring supervision/potential complications: HTN, electrolyte abnl/nutrition 3. Due to bladder management, bowel management, safety, skin/wound care, disease management, medication administration, pain management and patient education, does the patient require 24 hr/day rehab nursing? Yes 4. Does the patient require coordinated care of a physician, rehab nurse, therapy disciplines of PT, OT, SLP to address physical and functional deficits in the context of the above medical diagnosis(es)? Yes Addressing deficits in the following areas: balance, endurance, locomotion, strength, transferring, bowel/bladder control, bathing, dressing, feeding, grooming, toileting, cognition, speech and psychosocial support 5. Can the patient actively participate in an intensive therapy program of at least 3 hrs of therapy per day at least 5 days per week? Yes 6. The potential for patient to make measurable gains while on inpatient rehab is excellent 7. Anticipated functional outcomes upon discharge from inpatient rehab are modified independent  with PT, modified independent with OT, modified independent and supervision with SLP. 8. Estimated rehab length of stay to reach the above functional goals is: 7-11 days 9. Anticipated discharge destination: Home 10. Overall Rehab/Functional Prognosis: excellent   RECOMMENDATIONS: This patient's condition is appropriate for continued rehabilitative care in the following setting:  CIR Patient has agreed to participate in recommended program. Yes Note that insurance prior authorization may be required for reimbursement for recommended care.   Comment: Rehab Admissions Coordinator to follow up.   Thanks,   Ranelle OysterZachary T. Swartz, MD, [place] DomFAAPMR   I have personally performed a face to face diagnostic evaluation of this patient. Additionally, I have examined pertinent labs and radiographic images. I have reviewed and concur with the physician assistant's documentation above.     Mcarthur RossettiDaniel J Angiulli, PA-C 12/25/2019

## 2019-12-29 NOTE — Progress Notes (Signed)
Physical Therapy Treatment Patient Details Name: Brandon Perkins MRN: [id number] DOB: 1950/12/06 Today's Date: 12/29/2019    History of Present Illness 69 yo male with PMHx of poorly-controlled HTN, GIB, hypertensive retinopathy, cataract, unspecified "arm clot" who presented to UC with slurred speech, weakness, confusion. At UC, SBP > 200, referred to ED, and found to have SBP >240. 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    PT Comments    Patient seen for mobility progression. Pt requires min guard-min A for gait training due to impaired balance and gait deviations increasing risk for falls. Continue to recommend CIR for further skilled PT services to maximize independence and safety with mobility.     Follow Up Recommendations  CIR     Equipment Recommendations  Rolling walker with 5" wheels    Recommendations for Other Services Rehab consult     Precautions / Restrictions Precautions Precautions: Fall Restrictions Weight Bearing Restrictions: No    Mobility  Bed Mobility               General bed mobility comments: pt OOB in chair upon arrival   Transfers Overall transfer level: Needs assistance Equipment used: Straight cane Transfers: Sit to/from Stand Sit to Stand: Min guard         General transfer comment: cues for safety  Ambulation/Gait Ambulation/Gait assistance: Min assist;Min guard Gait Distance (Feet): 150 Feet Assistive device: Straight cane Gait Pattern/deviations: Step-to pattern;Step-through pattern;Wide base of support;Antalgic;Decreased stride length;Trunk flexed;Decreased stance time - right;Decreased step length - left   Gait velocity interpretation: <1.31 ft/sec, indicative of household ambulator General Gait Details: pt with decreased cadence and bilat step lengths and unable to increase gait speed without increased gait deviations; pt with LOB laterally and posteriorly during session and stepped on sock with AD;  assistance needed to steady    Stairs             Wheelchair Mobility    Modified Rankin (Stroke Patients Only) Modified Rankin (Stroke Patients Only) Pre-Morbid Rankin Score: No symptoms Modified Rankin: Moderately severe disability     Balance Overall balance assessment: Needs assistance Sitting-balance support: Feet supported Sitting balance-Leahy Scale: Good     Standing balance support: Bilateral upper extremity supported;Single extremity supported;During functional activity Standing balance-Leahy Scale: Fair                              Cognition Arousal/Alertness: Awake/alert Behavior During Therapy: WFL for tasks assessed/performed;Flat affect Overall Cognitive Status: Impaired/Different from baseline Area of Impairment: Problem solving;Safety/judgement                       Following Commands: Follows multi-step commands consistently;Follows multi-step commands with increased time Safety/Judgement: Decreased awareness of deficits   Problem Solving: Decreased initiation;Requires verbal cues;Slow processing General Comments: pt with tangential thoughts and appears to have decreased insight into deficits       Exercises      General Comments        Pertinent Vitals/Pain Pain Assessment: No/denies pain    Home Living                      Prior Function            PT Goals (current goals can now be found in the care plan section) Progress towards PT goals: Progressing toward goals    Frequency    Min 4X/week  PT Plan Current plan remains appropriate    Co-evaluation              AM-PAC PT "6 Clicks" Mobility   Outcome Measure  Help needed turning from your back to your side while in a flat bed without using bedrails?: A Little Help needed moving from lying on your back to sitting on the side of a flat bed without using bedrails?: A Little Help needed moving to and from a bed to a chair  (including a wheelchair)?: A Little Help needed standing up from a chair using your arms (e.g., wheelchair or bedside chair)?: A Little Help needed to walk in hospital room?: A Little Help needed climbing 3-5 steps with a railing? : A Lot 6 Click Score: 17    End of Session Equipment Utilized During Treatment: Gait belt Activity Tolerance: Patient tolerated treatment well Patient left: in chair;with call bell/phone within reach;with chair alarm set Nurse Communication: Mobility status PT Visit Diagnosis: Other abnormalities of gait and mobility (R26.89);Muscle weakness (generalized) (M62.81);Other symptoms and signs involving the nervous system (R29.898)     Time: 5329-9242 PT Time Calculation (min) (ACUTE ONLY): 52 min  Charges:  $Gait Training: 23-37 mins $Therapeutic Activity: 8-22 mins                     Earney Navy, PTA Acute Rehabilitation Services Pager: [phone] Office: [phone]     Darliss Cheney 12/29/2019, 4:09 PM

## 2019-12-29 NOTE — Progress Notes (Signed)
PMR Admission Coordinator Pre-Admission Assessment   Patient: Brandon Perkins is an 69 y.o., male MRN: [id number] DOB: 12-21-1950 Height: 6\' 2"  (188 cm) Weight: 78.6 kg                                                                                                                                                  Insurance Information HMO:     PPO:      PCP:      IPA:      80/20:      OTHER:  PRIMARY: Medicare A and B      Policy#: [id number]      Subscriber: pt CM Name:       Phone#:      Fax#:  Pre-Cert#: verified 01-08-1995:  Benefits:  Phone #:      Name:  Eff. Date: A and B 09/03/17     Deduct: $1484      Out of Pocket Max: n/a      Life Max: n/a  CIR: 100%      SNF: 20 full days Outpatient: 80%     Co-Pay: 20% Home Health: 100%      Co-Pay:  DME: 80%     Co-Pay: 20% Providers:  SECONDARY:       Policy#:       Phone#:    11/01/17:       Phone#:    The "Data Collection Information Summary" for patients in Inpatient Rehabilitation Facilities with attached "Privacy Act Statement-Health Care Records" was provided and verbally reviewed with: Patient and Family   Emergency Contact Information Contact Information     Name Relation Home Work Mobile    Brandon Perkins Son [phone]        [phone] Daughter     [phone]       Current Medical History  Patient Admitting Diagnosis: CVA    History of Present Illness:  Brandon Perkins is a 69 year old right-handed male with history of hypertension. He presented on 12/24/19 with dysarthria and AMS. Elevated blood pressure systolics in the 200s and placed on Cleviprex.  Cranial CT scan unremarkable for acute intracranial process. 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  Admission chemistries with  potassium 5.8, alcohol negative, SARS coronavirus negative, urinalysis negative nitrite.  Echocardiogram with EF of 60%, grade 1 diastolic dysfunction. Maintain on aspirin and Plavix for CVA prophylaxis x3 months then aspirin alone.  Subcutaneous Lovenox for DVT prophylaxis.  Tolerating a regular diet.  Therapy evaluations completed and patient was recommended for a comprehensive rehab program.   Complete NIHSS TOTAL: 1 Glasgow Coma Scale Score: 15  Past Medical History      Past Medical History:  Diagnosis Date  . Blood clot in vein      left arm vein blood clot, not know the detail, treated with ASA per pt  . Cataract      OU  . Hypertension    . Hypertensive retinopathy      OU      Family History  family history includes Colon cancer in his father; Dementia in his mother; Diabetes in his brother; Stroke in his sister.   Prior Rehab/Hospitalizations:  Has the patient had prior rehab or hospitalizations prior to admission? No   Has the patient had major surgery during 100 days prior to admission? No   Current Medications    Current Facility-Administered Medications:  .   stroke: mapping our early stages of recovery book, , Does not apply, Once, Brandon Brood, MD, Stopped at 12/24/19 1910 .  amLODipine (NORVASC) tablet 10 mg, 10 mg, Oral, Daily, Perkins, Ravi, MD, 10 mg at 12/29/19 9518 .  aspirin EC tablet 81 mg, 81 mg, Oral, Daily, Perkins, Ravi, MD, 81 mg at 12/29/19 0923 .  atorvastatin (LIPITOR) tablet 40 mg, 40 mg, Oral, Daily, Perkins, Ravi, MD, 40 mg at 12/29/19 0922 .  Chlorhexidine Gluconate Cloth 2 % PADS 6 each, 6 each, Topical, Daily, Brandon Brood, MD, 6 each at 12/29/19 [phone] .  chlorthalidone (HYGROTON) tablet 25 mg, 25 mg, Oral, Daily, Perkins, Ravi, MD, 25 mg at 12/29/19 6063 .  clopidogrel (PLAVIX) tablet 75 mg, 75 mg, Oral, Daily, Perkins, Ravi, MD, 75 mg at 12/29/19 0923 .  docusate sodium (COLACE) capsule 100 mg, 100 mg, Oral, BID PRN, Perkins, Ravi,  MD .  enoxaparin (LOVENOX) injection 40 mg, 40 mg, Subcutaneous, Q24H, Perkins, Ravi, MD, 40 mg at 12/28/19 2055 .  lisinopril (ZESTRIL) tablet 10 mg, 10 mg, Oral, Daily, Perkins, Ravi, MD, 10 mg at 12/29/19 0160 .  polyethylene glycol (MIRALAX / GLYCOLAX) packet 17 g, 17 g, Oral, Daily PRN, Perkins, Ravi, MD .  QUEtiapine (SEROQUEL) tablet 12.5 mg, 12.5 mg, Oral, BID BM & HS PRN, Brandon Starch, NP   Patients Current Diet:     Diet Order                      Diet - low sodium heart healthy           Diet Heart Room service appropriate? Yes; Fluid consistency: Thin  Diet effective now                   Precautions / Restrictions Precautions Precautions: Fall Restrictions Weight Bearing Restrictions: No    Has the patient had 2 or more falls or a fall with injury in the past year?Yes   Prior Activity Level Community (5-7x/wk): independent with SPC prior to admission, not driving   Prior Functional Level Prior Function Level of Independence: Independent with assistive device(s) Comments: pt reports using SPC for mobility tasks, reports friend typically assists with driving/getting groceries (reports he doesn't like going to the store). Was performing iADL tasks at home    Self Care: Did the patient need help bathing, dressing, using the toilet or eating?  Independent   Indoor Mobility: Did the patient need assistance with walking from room to room (with or without device)? Independent   Stairs: Did the patient need assistance with internal or external stairs (with or without device)? Independent   Functional Cognition: Did the patient need help  planning regular tasks such as shopping or remembering to take medications? Independent   Home Assistive Devices / Equipment Home Equipment: Cane - single point   Prior Device Use: Indicate devices/aids used by the patient prior to current illness, exacerbation or injury? spc   Current Functional Level Cognition    Arousal/Alertness: Awake/alert Overall Cognitive Status: Impaired/Different from baseline Current Attention Level: Selective Orientation Level: Oriented to person Following Commands: Follows multi-step commands consistently General Comments: Pt soft spoken Attention: Selective Selective Attention: Impaired Selective Attention Impairment: Verbal basic Memory: Impaired Memory Impairment: Retrieval deficit, Decreased recall of new information Awareness: Appears intact(intellectual awareness appears to be intact) Problem Solving: Impaired Problem Solving Impairment: Verbal complex, Verbal basic    Extremity Assessment (includes Sensation/Coordination)   Upper Extremity Assessment: Overall WFL for tasks assessed LUE Deficits / Details: LUE grossly weaker than RUE (3+/5) LUE Sensation: (denies sensation changes) LUE Coordination: WNL  Lower Extremity Assessment: Overall WFL for tasks assessed     ADLs   Overall ADL's : Needs assistance/impaired Eating/Feeding: NPO Grooming: Wash/dry hands, Wash/dry face, Standing, Min guard Upper Body Bathing: Set up, Min guard, Sitting Lower Body Bathing: Minimal assistance, +2 for safety/equipment, Sit to/from stand Upper Body Dressing : Set up, Min guard, Sitting Lower Body Dressing: Min guard, Sit to/from stand Lower Body Dressing Details (indicate cue type and reason): pt was able to doff/donn socks seated in recliner via figure 4; min A-guard sit to stand  Toilet Transfer: Minimal assistance, Ambulation, RW Toilet Transfer Details (indicate cue type and reason): simulated via transfer to recliner Toileting- Clothing Manipulation and Hygiene: Minimal assistance, Sit to/from stand, Sitting/lateral lean Functional mobility during ADLs: Minimal assistance, Rolling walker General ADL Comments: pt with decreased UE coordination     Mobility   Overal bed mobility: Needs Assistance Bed Mobility: Supine to Sit Supine to sit: Min guard, HOB  elevated General bed mobility comments: pt OOB in chair upon arrival      Transfers   Overall transfer level: Needs assistance Equipment used: Straight cane Transfers: Sit to/from Stand Sit to Stand: Min assist General transfer comment: for safety/balance     Ambulation / Gait / Stairs / Psychologist, prison and probation services   Ambulation/Gait Ambulation/Gait assistance: Editor, commissioning (Feet): (100 ft X 2 with standing break) Assistive device: Straight cane, Rolling walker (2 wheeled) Gait Pattern/deviations: Step-to pattern, Step-through pattern, Shuffle, Wide base of support General Gait Details: initially ambulating in room with SPC and then use of RW due to instability; cues for safe use of AD and assistance for balance especially with turning  Gait velocity: decreased     Posture / Balance Dynamic Sitting Balance Sitting balance - Comments: donning socks in recliner Balance Overall balance assessment: Needs assistance Sitting-balance support: Feet supported Sitting balance-Leahy Scale: Good Sitting balance - Comments: donning socks in recliner Standing balance support: Bilateral upper extremity supported, Single extremity supported, During functional activity Standing balance-Leahy Scale: Fair Standing balance comment: needs UE support for balance High level balance activites: Direction changes, Turns High Level Balance Comments: Pt did reaching activity while standing at sink with RW     Special needs/care consideration Diabetic management yes and Designated visitor daughter Derenda Mis and son Laqueta Jean        Previous Home Environment (from acute therapy documentation) Living Arrangements: Alone  Lives With: Alone Available Help at Discharge: Family Type of Home: House Home Layout: One level Home Access: Stairs to enter Entrance Stairs-Rails: Right Entrance Stairs-Number of Steps: 3 Bathroom Shower/Tub: Tub/shower  unit Bathroom Toilet: Standard   Discharge  Living Setting Plans for Discharge Living Setting: Patient's home, Alone Type of Home at Discharge: House Discharge Home Layout: One level Discharge Home Access: Stairs to enter Entrance Stairs-Rails: Right Entrance Stairs-Number of Steps: 3 Discharge Bathroom Shower/Tub: Tub/shower unit Discharge Bathroom Toilet: Standard Discharge Bathroom Accessibility: Yes How Accessible: Accessible via walker Does the patient have any problems obtaining your medications?: No   Social/Family/Support Systems Anticipated Caregiver: Derenda Mis (dtr), Laqueta Jean (son) Anticipated Caregiver's Contact Information: [name] [phone] Ability/Limitations of Caregiver: [name] works during the day, [name] works at [employer] Availability: Intermittent Discharge Plan Discussed with Primary Caregiver: Yes Is Caregiver In Agreement with Plan?: Yes Does Caregiver/Family have Issues with Lodging/Transportation while Pt is in Rehab?: No     Goals Patient/Family Goal for Rehab: PT/OT mod I, SLP supervision Expected length of stay: 4-6 days Pt/Family Agrees to Admission and willing to participate: Yes Program Orientation Provided & Reviewed with Pt/Caregiver Including Roles  & Responsibilities: Yes     Decrease burden of Care through IP rehab admission: n/a     Possible need for SNF placement upon discharge: Not anticipated.      Patient Condition: This patient's medical and functional status has changed since the consult dated: 12/26/19 in which the Rehabilitation Physician determined and documented that the patient's condition is appropriate for intensive rehabilitative care in an inpatient rehabilitation facility. See "History of Present Illness" (above) for medical update. Functional changes are: min assist x100'. Patient's medical and functional status update has been discussed with the Rehabilitation physician and patient remains appropriate for inpatient  rehabilitation. Will admit to inpatient rehab today.   Preadmission Screen Completed By:  Stephania Fragmin, PT, DPT 12/29/2019 1:24 PM ______________________________________________________________________   Discussed status with Dr. Allena Katz on 12/29/19 at  1:28 PM.  and received approval for admission today.   Admission Coordinator:  Freddie Apley, DPT time 1:28 PM Dorna Bloom 12/29/19

## 2019-12-29 NOTE — Progress Notes (Signed)
Inpatient Rehab Admissions Coordinator:   I have a bed available for pt to admit to CIR today.  Dr. Jerral Ralph in agreement. I will let pt/family and TOC team know.   Estill Dooms, PT, DPT Admissions Coordinator [phone] 12/29/19  1:21 PM

## 2019-12-29 NOTE — H&P (Signed)
Physical Medicine and Rehabilitation Admission H&P     Chief Complaint  Patient presents with  . Aphasia  :  HPI: Brandon Perkins is a 69 year old right-handed male with history of hypertension. History taken form chart review and patient due to cognition. Patient lives alone independent with assistive device. 1 level home 3 steps to entry. He presented on 12/24/19 with dysarthria and AMS. Elevated blood pressure systolics in the 200s and placed on Cleviprex. Cranial CT scan unremarkable for acute intracranial process. 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 Admission chemistries with potassium 5.8, alcohol negative, SARS coronavirus negative, urinalysis negative nitrite. Echocardiogram with EF of 60%, grade 1 diastolic dysfunction. Maintain on aspirin and Plavix for CVA prophylaxis x3 months then aspirin alone. Subcutaneous Lovenox for DVT prophylaxis. Tolerating a regular diet. Therapy evaluations completed and patient was admitted for a comprehensive rehab program. Please see preadmission assessment from earlier today as well.  Review of Systems  Constitutional: Negative for chills and fever.  HENT: Negative for hearing loss.  Eyes: Negative for blurred vision and double vision.  Respiratory: Negative for cough and shortness of breath.  Cardiovascular: Negative for chest pain, palpitations and leg swelling.  Gastrointestinal: Positive for constipation. Negative for heartburn, nausea and vomiting.  Genitourinary: Negative for dysuria, flank pain and hematuria.  Musculoskeletal: Positive for myalgias.  Skin: Negative for rash.  Neurological: Positive for speech change. Negative for focal weakness.  All other systems reviewed and are negative.       Past Medical History:  Diagnosis Date   . Blood clot in vein    left arm vein blood clot, not know the detail, treated with ASA per pt  . Cataract    OU  . Hypertension   . Hypertensive retinopathy    OU   History reviewed. No pertinent cranial surgical history.       Family History  Problem Relation Age of Onset  . Dementia Mother   . Colon cancer Father   . Stroke Sister    Sister had brain aneurysm  . Diabetes Brother    Social History: reports that he has never smoked. He has never used smokeless tobacco. He reports that he does not drink alcohol or use drugs.  Allergies:       Allergies  Allergen Reactions  . Aspirin     When takes a lot of aspirin-- reaction unknown         Medications Prior to Admission  Medication Sig Dispense Refill  . hydrochlorothiazide (HYDRODIURIL) 25 MG tablet Take 1 tablet (25 mg total) by mouth daily. (Patient not taking: Reported on 12/24/2019) 30 tablet 0  . oxyCODONE-acetaminophen (PERCOCET/ROXICET) 5-325 MG per tablet Take 1 tablet by mouth every 4 (four) hours as needed for moderate pain. (Patient not taking: Reported on 12/24/2019) 15 tablet 0  . [DISCONTINUED] amLODipine (NORVASC) 10 MG tablet Take 1 tablet (10 mg total) by mouth daily. (Patient not taking: Reported on 12/24/2019) 30 tablet 0  . [DISCONTINUED] aspirin EC 81 MG tablet Take 1 tablet (81 mg total) by mouth daily. (Patient not taking: Reported on 12/24/2019)     Drug Regimen Review  Drug regimen was reviewed and remains appropriate with no significant issues identified  Home:  Home Living  Family/patient expects to be discharged to:: Private residence  Living Arrangements:  Alone  Available Help at Discharge: Family  Type of Home: House  Home Access: Stairs to enter  Technical brewer of Steps: 3  Entrance Stairs-Rails: Right  Home Layout: One level  Bathroom Shower/Tub: Administrator, Civil Service: Standard  Home Equipment: Radio producer - single point  Lives With: Alone  Functional History:  Prior  Function  Level of Independence: Independent with assistive device(s)  Comments: pt reports using SPC for mobility tasks, reports friend typically assists with driving/getting groceries (reports he doesn't like going to the store). Was performing iADL tasks at home  Functional Status:  Mobility:  Bed Mobility  Overal bed mobility: Needs Assistance  Bed Mobility: Supine to Sit  Supine to sit: Min guard, HOB elevated  General bed mobility comments: pt OOB in chair upon arrival  Transfers  Overall transfer level: Needs assistance  Equipment used: Straight cane  Transfers: Sit to/from Stand  Sit to Stand: Min assist  General transfer comment: for safety/balance  Ambulation/Gait  Ambulation/Gait assistance: Min assist  Gait Distance (Feet): (100 ft X 2 with standing break)  Assistive device: Straight cane, Rolling walker (2 wheeled)  Gait Pattern/deviations: Step-to pattern, Step-through pattern, Shuffle, Wide base of support  General Gait Details: initially ambulating in room with SPC and then use of RW due to instability; cues for safe use of AD and assistance for balance especially with turning  Gait velocity: decreased   ADL:  ADL  Overall ADL's : Needs assistance/impaired  Eating/Feeding: NPO  Grooming: Wash/dry hands, Wash/dry face, Standing, Min guard  Upper Body Bathing: Set up, Min guard, Sitting  Lower Body Bathing: Minimal assistance, +2 for safety/equipment, Sit to/from stand  Upper Body Dressing : Set up, Min guard, Sitting  Lower Body Dressing: Min guard, Sit to/from stand  Lower Body Dressing Details (indicate cue type and reason): pt was able to doff/donn socks seated in recliner via figure 4; min A-guard sit to stand  Toilet Transfer: Minimal assistance, Ambulation, RW  Toilet Transfer Details (indicate cue type and reason): simulated via transfer to recliner  Toileting- Clothing Manipulation and Hygiene: Minimal assistance, Sit to/from stand, Sitting/lateral lean   Functional mobility during ADLs: Minimal assistance, Rolling walker  General ADL Comments: pt with decreased UE coordination  Cognition:  Cognition  Overall Cognitive Status: Impaired/Different from baseline  Arousal/Alertness: Awake/alert  Orientation Level: Oriented to person  Attention: Selective  Selective Attention: Impaired  Selective Attention Impairment: Verbal basic  Memory: Impaired  Memory Impairment: Retrieval deficit, Decreased recall of new information  Awareness: Appears intact(intellectual awareness appears to be intact)  Problem Solving: Impaired  Problem Solving Impairment: Verbal complex, Verbal basic  Cognition  Arousal/Alertness: Awake/alert  Behavior During Therapy: WFL for tasks assessed/performed, Flat affect  Overall Cognitive Status: Impaired/Different from baseline  Area of Impairment: Awareness, Problem solving, Attention  Orientation Level: Disoriented to, Time(" 2019")  Current Attention Level: Selective  Following Commands: Follows multi-step commands consistently  Awareness: Emergent  Problem Solving: Decreased initiation, Requires verbal cues, Slow processing  General Comments: Pt soft spoken  Physical Exam:  Blood pressure (!) (P) 154/76, pulse (!) (P) 54, temperature (P) 97.7 F (36.5 C), temperature source (P) Oral, resp. rate (P) 18, height 6\' 2"  (1.88 m), weight 78.6 kg, SpO2 (P) 100 %.  Physical Exam  Vitals reviewed.  Constitutional: He appears well-developed and well-nourished.  HENT:  Head: Normocephalic and atraumatic.  Eyes: EOM are normal. Right eye exhibits no discharge. Left eye exhibits no discharge.  Neck: No tracheal deviation present. No  thyromegaly present.  Respiratory: Effort normal. No stridor. No respiratory distress.  GI: Soft. He exhibits no distension.  Musculoskeletal:  Comments: No edema or tenderness in extremities  Neurological: He is alert.  Alert Makes eye contact with examiner. Follows commands. Provides  name,age and DOB. Dysarthria Motor: Grossly 5/5 throughout Mild LUE >RUE dysmetria Sensation intact to light touch  Skin: Skin is warm and dry.  Psychiatric: His affect is blunt. His speech is slurred.  Tangential at times   Lab Results Last 48 Hours     Imaging Results (Last 48 hours)    Medical Problem List and Plan:  1. 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 Antithrombotics:  -DVT/anticoagulation: Lovenox  -antiplatelet therapy: Aspirin Plavix x3 months then aspirin alone  3. Pain Management: Tylenol as needed  Monitor for headaches.  4. Mood: Provide emotional support  -antipsychotic agents: Seroquel 12.5 mg twice daily  5. Neuropsych: This patient is ?fully capable of making decisions on his own behalf.  6. Skin/Wound Care: Routine skin checks  7. Fluids/Electrolytes/Nutrition: Routine in and outs. CMP ordered.  8. Hypertension. Hygroton 25 mg daily, Norvasc 10 mg daily lisinopril 10 mg daily.  Monitor with increased mobility.  9. Hyperlipidemia. Lipitor  Mcarthur Rossetti Angiulli, PA-C  12/29/2019  I have personally performed a face to face diagnostic evaluation, including, but not limited to relevant history and physical exam findings, of this patient and developed relevant assessment and plan. Additionally, I have reviewed and concur with the physician assistant's documentation above.  Maryla Morrow, MD, ABPMR  The patient's status has not changed. The original post admission physician evaluation remains appropriate, and any changes from the pre-admission screening or documentation from the acute chart are noted above.   Maryla Morrow, MD, ABPMR

## 2019-12-29 NOTE — Discharge Summary (Signed)
Physician Discharge Summary  Brandon Perkins OZY:[id number] DOB: July 10, 1951 DOA: 12/24/2019  PCP: Patient, No Pcp Per  Admit date: 12/24/2019 Discharge date: 12/29/2019  Admitted From: home  Disposition:  CIR   Recommendations for Outpatient Follow-up:  1. Follow up with PCP in 1-2 weeks after discharge   Discharge Condition:stable   CODE STATUS:full code  Diet recommendation: Low-salt diet.  Discharge summary: 69 year old gentleman with history of poorly controlled hypertension, reported GI bleeding, hypertensive retinopathy who presented to the urgent care with slurred speech, weakness and confusion.  He was also noted to be hypertensive with blood pressure more than 200.  In the emergency room, his blood pressures were more than 240.  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 Patient was admitted to intensive care unit with clevidipine infusion to control his blood pressure. 5/25, transition to oral antihypertensives and transferred to medical floor.  Acute thrombotic stroke: Suspect secondary to large vessel disease. Clinical findings, dysarthria and dysmetria.  Difficulty ambulating. CT head findings, right cerebral subacute infarct.  Small vessel disease. MRI of the brain, right superior cerebellar artery territory infarct. CTA of the head and neck, moderate to severe stenosis multiple areas. 2D echocardiogram, normal ejection fraction.  Severe left ventricular hypertrophy consistent with chronic diastolic dysfunction.  No source of emboli. Antiplatelet therapy, not taking any treatment at home.  Started on aspirin and Plavix.  Plan is to continue aspirin and Plavix for 3 months then aspirin alone. LDL 128.  Started on Lipitor 40 mg. Hemoglobin A1c, 6.0.  No indication for treatment. Therapy recommendations, acute inpatient rehab.  Hypertensive emergency: With acute stroke.  He was admitted to the intensive care unit and treated with intravenous infusion.  Blood  pressures more than 240 on arrival.  Probably not consistently taking home medications. Resumed on amlodipine 10 mg, chlorthalidone 25 mg.  Started on lisinopril 10 mg.  blood pressures are acceptable.  Labetalol as needed.  Hyperlipidemia: LDL 128.  Lipitor 40.  Medically stable to transfer to acute inpatient rehab today.  Discharge Diagnoses:  Active Problems:   Hypertensive emergency   Cerebral thrombosis with cerebral infarction    Discharge Instructions  Discharge Instructions    Diet - low sodium heart healthy   Complete by: As directed    Increase activity slowly   Complete by: As directed      Allergies as of 12/29/2019      Reactions   Aspirin    When takes a lot of aspirin-- reaction unknown      Medication List    STOP taking these medications   hydrochlorothiazide 25 MG tablet Commonly known as: HYDRODIURIL   oxyCODONE-acetaminophen 5-325 MG tablet Commonly known as: PERCOCET/ROXICET     TAKE these medications   amLODipine 10 MG tablet Commonly known as: NORVASC Take 1 tablet (10 mg total) by mouth daily.   aspirin EC 81 MG tablet Take 1 tablet (81 mg total) by mouth daily.   atorvastatin 40 MG tablet Commonly known as: LIPITOR Take 1 tablet (40 mg total) by mouth daily. Start taking on: Dec 30, 2019   chlorthalidone 25 MG tablet Commonly known as: HYGROTON Take 1 tablet (25 mg total) by mouth daily. Start taking on: Dec 30, 2019   clopidogrel 75 MG tablet Commonly known as: PLAVIX Take 1 tablet (75 mg total) by mouth daily for 17 days. Start taking on: Dec 30, 2019   lisinopril 10 MG tablet Commonly known as: ZESTRIL Take 1 tablet (10 mg total) by  mouth daily. Start taking on: Dec 30, 2019   QUEtiapine 25 MG tablet Commonly known as: SEROQUEL Take 0.5 tablets (12.5 mg total) by mouth 3 times/day as needed-between meals & bedtime (for agitation, insomnia).       Allergies  Allergen Reactions  . Aspirin     When takes a lot of  aspirin-- reaction unknown    Consultations:  Inpatient rehab  Neurology   Procedures/Studies: CT ANGIO HEAD W OR WO CONTRAST  Result Date: 12/24/2019 CLINICAL DATA:  69 year old male with 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 EXAM: CT ANGIOGRAPHY HEAD AND NECK TECHNIQUE: Multidetector CT imaging of the head and neck was performed using the standard protocol during bolus administration of intravenous contrast. Multiplanar CT image reconstructions and MIPs were obtained to evaluate the vascular anatomy. Carotid stenosis measurements (when applicable) are obtained utilizing NASCET criteria, using the distal internal carotid diameter as the denominator. CONTRAST:  75mL OMNIPAQUE IOHEXOL 350 MG/ML SOLN COMPARISON:  Head CT and MRI earlier today. FINDINGS: CTA NECK Skeleton: Advanced cervical spine disc and endplate degeneration with reversal of lordosis. Carious posterior right maxillary dentition. No acute osseous abnormality identified. Upper chest: Negative. Other neck: No acute findings. Aortic arch: 3 vessel arch configuration with mild for age arch atherosclerosis. Right carotid system: Tortuous brachiocephalic artery without plaque or stenosis. Normal proximal right CCA. There is low-density soft plaque within the medial right CCA at the level of the thyroid cartilage on series 5, image 126, but no significant stenosis. Capacious right carotid bifurcation with minimal calcified plaque of right bulb. Tortuous right ICA just below the skull base without stenosis. Left carotid system: Subtle soft plaque in the left CCA which is mildly tortuous. Mild calcified plaque in the posterior left ICA bulb without stenosis. Similar left ICA tortuosity at C1-C2. Vertebral arteries: Mildly tortuous proximal right subclavian artery without plaque or stenosis. Normal right vertebral artery origin. The right vertebral is patent to the skull base without plaque  or stenosis. No proximal left subclavian artery plaque or stenosis. Normal left vertebral artery origin. Codominant left vertebral is patent to the skull base without plaque or stenosis. CTA HEAD Posterior circulation: Codominant V4 segments with minimal calcified plaque. No stenosis on the left. There is mild right V4 segment stenosis (series 7, image 145) which may be related to soft plaque. Both PICA origins remain patent. Patent vertebrobasilar junction. Patent and normal proximal and mid basilar artery. However, the distal basilar tapers, with patent but irregular basilar tip, highly irregular bilateral SCA and PCA origins (series 11, image 22). There is at least partial patency of the right SCA visible on series 8, image 102, but all 4 basilar artery branches in this region are highly irregular. The P2 and P3 segments appear more normal, although remain moderately irregular. No PCA branch occlusion is identified. Anterior circulation: Both ICA siphons are patent with mild ectasia, minimal calcified plaque and no stenosis. Normal ophthalmic artery origins. Posterior communicating arteries are diminutive or absent. Patent carotid termini but severe irregularity and stenosis at both ACA origins rim anise int of that at the basilar tip (series 11, image 19). Both MCA origins are more normal. Bilateral ACAs are patent but diminutive and irregular. There is moderate to severe irregularity and stenosis of mid and distal A2 segments (series 12, image 20). Mildly irregular left MCA M1 without stenosis. Patent left MCA bifurcation. Mildly irregular left MCA branches. Tapered and irregular right MCA M1 which  becomes moderately irregular and stenotic at the right MCA bifurcation (series 11, image 17). However, the major right MCA branches remain patent although moderately irregular throughout (series 12, image 13). Venous sinuses: Early contrast timing, not well evaluated. Anatomic variants: None. Review of the MIP images  confirms the above findings IMPRESSION: 1. 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 2. But negative for any complete occlusion; and the Right SCA remains at least partially patent. 3. Also widespread irregularity and stenoses of the second order intracranial branches, including moderate stenosis at the Right MCA bifurcation. 4. The above is favored due to advanced intracranial atherosclerosis, despite only minimal/mild atherosclerosis in the neck. There is no significant carotid or vertebral artery stenosis. Salient findings were communicated to Dr. Otelia Limes at 8:16 pm on 12/24/2019 by text page via the [facility] messaging system. Electronically Signed   By: Odessa Fleming M.D.   On: 12/24/2019 20:16   CT Head Wo Contrast  Result Date: 12/24/2019 CLINICAL DATA:  Focal neuro deficit.  Aphasia.  Stroke suspected. EXAM: CT HEAD WITHOUT CONTRAST TECHNIQUE: Contiguous axial images were obtained from the base of the skull through the vertex without intravenous contrast. COMPARISON:  None. FINDINGS: Brain: There is no evidence of acute intracranial hemorrhage, mass lesion, brain edema or extra-axial fluid collection. There is atrophy with diffuse prominence of the ventricles and subarachnoid spaces. Cavum septum pellucidum and vergae noted incidentally. Multifocal encephalomalacia is present, most notably in the left occipital lobe and in both cerebellar hemispheres. The right cerebellar component is slightly less distinct and could be a late subacute infarct. Old lacunar infarcts are present within the right lentiform nuclei and pons. There are chronic small vessel ischemic changes in the periventricular white matter. There is no CT evidence of acute cortical infarction. Vascular: Intracranial vascular calcifications. No hyperdense vessel identified. Skull: Negative for fracture or suspicious focal lesion. Sinuses/Orbits:  Left maxillary sinus polyp or mucous retention cysts. The visualized paranasal sinuses, mastoid air cells and middle ears are otherwise clear. No significant orbital findings. Other: None. IMPRESSION: 1. No CT evidence of acute cortical infarction or hemorrhage. 2. Multifocal encephalomalacia as described. Right cerebellar component could reflect a late subacute infarct. 3. Atrophy and chronic small vessel ischemic changes. Electronically Signed   By: Carey Bullocks M.D.   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 EXAM: CT ANGIOGRAPHY HEAD AND NECK TECHNIQUE: Multidetector CT imaging of the head and neck was performed using the standard protocol during bolus administration of intravenous contrast. Multiplanar CT image reconstructions and MIPs were obtained to evaluate the vascular anatomy. Carotid stenosis measurements (when applicable) are obtained utilizing NASCET criteria, using the distal internal carotid diameter as the denominator. CONTRAST:  29mL OMNIPAQUE IOHEXOL 350 MG/ML SOLN COMPARISON:  Head CT and MRI earlier today. FINDINGS: CTA NECK Skeleton: Advanced cervical spine disc and endplate degeneration with reversal of lordosis. Carious posterior right maxillary dentition. No acute osseous abnormality identified. Upper chest: Negative. Other neck: No acute findings. Aortic arch: 3 vessel arch configuration with mild for age arch atherosclerosis. Right carotid system: Tortuous brachiocephalic artery without plaque or stenosis. Normal proximal right CCA. There is low-density soft plaque within the medial right CCA at the level of the thyroid cartilage on series  5, image 126, but no significant stenosis. Capacious right carotid bifurcation with minimal calcified plaque of right bulb. Tortuous right ICA just below the skull base without  stenosis. Left carotid system: Subtle soft plaque in the left CCA which is mildly tortuous. Mild calcified plaque in the posterior left ICA bulb without stenosis. Similar left ICA tortuosity at C1-C2. Vertebral arteries: Mildly tortuous proximal right subclavian artery without plaque or stenosis. Normal right vertebral artery origin. The right vertebral is patent to the skull base without plaque or stenosis. No proximal left subclavian artery plaque or stenosis. Normal left vertebral artery origin. Codominant left vertebral is patent to the skull base without plaque or stenosis. CTA HEAD Posterior circulation: Codominant V4 segments with minimal calcified plaque. No stenosis on the left. There is mild right V4 segment stenosis (series 7, image 145) which may be related to soft plaque. Both PICA origins remain patent. Patent vertebrobasilar junction. Patent and normal proximal and mid basilar artery. However, the distal basilar tapers, with patent but irregular basilar tip, highly irregular bilateral SCA and PCA origins (series 11, image 22). There is at least partial patency of the right SCA visible on series 8, image 102, but all 4 basilar artery branches in this region are highly irregular. The P2 and P3 segments appear more normal, although remain moderately irregular. No PCA branch occlusion is identified. Anterior circulation: Both ICA siphons are patent with mild ectasia, minimal calcified plaque and no stenosis. Normal ophthalmic artery origins. Posterior communicating arteries are diminutive or absent. Patent carotid termini but severe irregularity and stenosis at both ACA origins rim anise int of that at the basilar tip (series 11, image 19). Both MCA origins are more normal. Bilateral ACAs are patent but diminutive and irregular. There is moderate to severe irregularity and stenosis of mid and distal A2 segments (series 12, image 20). Mildly irregular left MCA M1 without stenosis. Patent left MCA  bifurcation. Mildly irregular left MCA branches. Tapered and irregular right MCA M1 which becomes moderately irregular and stenotic at the right MCA bifurcation (series 11, image 17). However, the major right MCA branches remain patent although moderately irregular throughout (series 12, image 13). Venous sinuses: Early contrast timing, not well evaluated. Anatomic variants: None. Review of the MIP images confirms the above findings IMPRESSION: 1. 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 2. But negative for any complete occlusion; and the Right SCA remains at least partially patent. 3. Also widespread irregularity and stenoses of the second order intracranial branches, including moderate stenosis at the Right MCA bifurcation. 4. The above is favored due to advanced intracranial atherosclerosis, despite only minimal/mild atherosclerosis in the neck. There is no significant carotid or vertebral artery stenosis. Salient findings were communicated to Dr. Otelia Limes at 8:16 pm on 12/24/2019 by text page via the [facility] messaging system. Electronically Signed   By: Odessa Fleming M.D.   On: 12/24/2019 20:16   MR BRAIN WO CONTRAST  Result Date: 12/24/2019 CLINICAL DATA:  69 year old male with aphasia, neurologic deficit. EXAM: MRI HEAD WITHOUT CONTRAST TECHNIQUE: Multiplanar, multiecho pulse sequences of the brain and surrounding structures were obtained without intravenous contrast. COMPARISON:  Head CT 1255 hours today. FINDINGS: Brain: Patchy and confluent restricted diffusion in the right superior cerebellar artery territory (series 11, image 50). Associated T2 and FLAIR hyperintensity, T1 hypointensity, no evidence of acute hemorrhage. No posterior fossa mass effect. Superimposed chronic infarcts in  the contralateral left cerebellum, bilateral pons, bilateral deep gray nuclei, bilateral corona radiata. Chronic cortical  encephalomalacia in the left parietal and occipital lobes. Occasional chronic micro hemorrhages including in the left brainstem. No midline shift, mass effect, evidence of mass lesion, ventriculomegaly, extra-axial collection or acute intracranial hemorrhage. Cervicomedullary junction and pituitary are within normal limits. Cavum septum pellucidum (normal variant). Vascular: Major intracranial vascular flow voids are preserved. Generalized intracranial artery tortuosity. Skull and upper cervical spine: Partially visible cervical spine degeneration with evidence of mild degenerative spinal stenosis at C3-C4 (series 13, image 13). Visualized bone marrow signal is within normal limits. Sinuses/Orbits: Negative orbits. Mild maxillary sinus mucous retention cysts. Other: Mastoids are clear. Visible internal auditory structures appear normal. Scalp and face soft tissues appear negative. IMPRESSION: 1. 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 No associated hemorrhage or mass effect. 2. Underlying advanced chronic ischemic disease. 3. Partially visible cervical spine degeneration with mild spinal stenosis at C3-C4. Electronically Signed   By: Odessa Fleming M.D.   On: 12/24/2019 16:58   DG Chest Portable 1 View  Result Date: 12/24/2019 CLINICAL DATA:  Hypertension.  Slurred speech. EXAM: PORTABLE CHEST 1 VIEW COMPARISON:  None. FINDINGS: Assessment heart size is limited due to portable technique. The heart may be borderline to mildly enlarged. The hila, mediastinum, lungs, and pleura are otherwise unremarkable. IMPRESSION: No acute abnormalities. Electronically Signed   By: Gerome Sam III M.D   On: 12/24/2019 13:29   ECHOCARDIOGRAM COMPLETE  Result Date: 12/26/2019    ECHOCARDIOGRAM REPORT   Patient Name:   Brandon Perkins Date of Exam: 12/26/2019 Medical Rec #:  [id number]      Height:       74.0 in Accession #:    [id number]     Weight:        173.3 lb Date of Birth:  03/04/1951       BSA:          2.045 m Patient Age:    69 years       BP:           162/88 mmHg Patient Gender: M              HR:           66 bpm. Exam Location:  Inpatient Procedure: 2D Echo, Cardiac Doppler and Color Doppler Indications:    Stroke  History:        Patient has prior history of Echocardiogram examinations, most                 recent 11/08/2014. Risk Factors:Hypertension.  Sonographer:    Ross Ludwig RDCS (AE) Referring Phys: [id number] ASHISH ARORA IMPRESSIONS  1. Left ventricular ejection fraction, by estimation, is 55 to 60%. The left ventricle has normal function. The left ventricle has no regional wall motion abnormalities. There is severe left ventricular hypertrophy. Left ventricular diastolic parameters  are consistent with Grade I diastolic dysfunction (impaired relaxation). Consider cardiac amyloidosis.  2. Right ventricular systolic function is normal. The right ventricular size is normal.  3. The mitral valve is normal in structure. No evidence of mitral valve regurgitation. No evidence of mitral stenosis.  4. The aortic valve is tricuspid. Aortic valve regurgitation is mild. Mild aortic valve sclerosis is present, with no evidence of aortic valve stenosis.  5. Peak RV-RA gradient 19 mmHg.  6. The IVC was not visualized. FINDINGS  Left Ventricle: Left ventricular  ejection fraction, by estimation, is 55 to 60%. The left ventricle has normal function. The left ventricle has no regional wall motion abnormalities. The left ventricular internal cavity size was normal in size. There is  severe left ventricular hypertrophy. Left ventricular diastolic parameters are consistent with Grade I diastolic dysfunction (impaired relaxation). Right Ventricle: The right ventricular size is normal. No increase in right ventricular wall thickness. Right ventricular systolic function is normal. Left Atrium: Left atrial size was normal in size. Right Atrium: Right atrial size was normal  in size. Pericardium: There is no evidence of pericardial effusion. Mitral Valve: The mitral valve is normal in structure. Mild mitral annular calcification. No evidence of mitral valve regurgitation. No evidence of mitral valve stenosis. Tricuspid Valve: The tricuspid valve is normal in structure. Tricuspid valve regurgitation is trivial. Aortic Valve: The aortic valve is tricuspid. Aortic valve regurgitation is mild. Aortic regurgitation PHT measures 1716 msec. Mild aortic valve sclerosis is present, with no evidence of aortic valve stenosis. Aortic valve mean gradient measures 4.0 mmHg.  Aortic valve peak gradient measures 6.2 mmHg. Aortic valve area, by VTI measures 2.56 cm. Pulmonic Valve: The pulmonic valve was normal in structure. Pulmonic valve regurgitation is trivial. Aorta: The aortic root is normal in size and structure. Venous: The inferior vena cava was not well visualized. IAS/Shunts: No atrial level shunt detected by color flow Doppler.  LEFT VENTRICLE PLAX 2D LVIDd:         4.01 cm  Diastology LVIDs:         2.84 cm  LV e' lateral:   5.93 cm/s LV PW:         1.62 cm  LV E/e' lateral: 8.0 LV IVS:        1.65 cm  LV e' medial:    3.38 cm/s LVOT diam:     2.20 cm  LV E/e' medial:  14.0 LV SV:         59 LV SV Index:   29 LVOT Area:     3.80 cm  RIGHT VENTRICLE RV Basal diam:  2.97 cm RV S prime:     11.10 cm/s TAPSE (M-mode): 1.8 cm LEFT ATRIUM             Index       RIGHT ATRIUM           Index LA diam:        2.00 cm 0.98 cm/m  RA Area:     16.40 cm LA Vol (A2C):   52.9 ml 25.87 ml/m RA Volume:   41.00 ml  20.05 ml/m LA Vol (A4C):   72.7 ml 35.55 ml/m LA Biplane Vol: 64.9 ml 31.74 ml/m  AORTIC VALVE AV Area (Vmax):    2.78 cm AV Area (Vmean):   2.59 cm AV Area (VTI):     2.56 cm AV Vmax:           125.00 cm/s AV Vmean:          92.000 cm/s AV VTI:            0.232 m AV Peak Grad:      6.2 mmHg AV Mean Grad:      4.0 mmHg LVOT Vmax:         91.40 cm/s LVOT Vmean:        62.700 cm/s LVOT  VTI:          0.156 m LVOT/AV VTI ratio: 0.67 AI PHT:  1716 msec  AORTA Ao Root diam: 3.90 cm MITRAL VALVE               TRICUSPID VALVE MV Area (PHT): 3.27 cm    TR Peak grad:   19.9 mmHg MV Decel Time: 232 msec    TR Vmax:        223.00 cm/s MV E velocity: 47.30 cm/s MV A velocity: 68.60 cm/s  SHUNTS MV E/A ratio:  0.69        Systemic VTI:  0.16 m                            Systemic Diam: 2.20 cm Marca Anconaalton Mclean MD Electronically signed by Marca Anconaalton Mclean MD Signature Date/Time: 12/26/2019/3:59:06 PM    Final       Subjective: Patient seen and examined multiple times today.  He initially refused to go to rehab and wanted to go home.  Later today, he is agreeable to go to acute inpatient rehab. Has some difficulty walking.  He has some difficulty talking and takes long time to eat food.   Discharge Exam: Vitals:   12/29/19 0739 12/29/19 1214  BP: (!) 154/76 (!) 149/85  Pulse: (!) 54 73  Resp: 18 16  Temp: 97.7 F (36.5 C) 98 F (36.7 C)  SpO2: 100% 99%   Vitals:   12/28/19 2343 12/29/19 0410 12/29/19 0739 12/29/19 1214  BP: (!) 143/58 (!) 144/72 (!) 154/76 (!) 149/85  Pulse: 64 (!) 58 (!) 54 73  Resp: 18 17 18 16   Temp: 98.2 F (36.8 C) 97.8 F (36.6 C) 97.7 F (36.5 C) 98 F (36.7 C)  TempSrc: Oral Oral Oral Oral  SpO2: 100% 95% 100% 99%  Weight:      Height:        General: Pt is alert, awake, not in acute distress Cardiovascular: RRR, S1/S2 +, no rubs, no gallops Respiratory: CTA bilaterally, no wheezing, no rhonchi Abdominal: Soft, NT, ND, bowel sounds + Extremities: no edema, no cyanosis Neuro exam: Mild dysarthria.  No motor or sensory deficits both upper and lower extremities.   The results of significant diagnostics from this hospitalization (including imaging, microbiology, ancillary and laboratory) are listed below for reference.     Microbiology: Recent Results (from the past 240 hour(s))  SARS Coronavirus 2 by RT PCR (hospital order[facility]) Nasopharyngeal Nasopharyngeal Swab     Status: None   Collection Time: 12/24/19  6:49 PM   Specimen: Nasopharyngeal Swab  Result Value Ref Range Status   SARS Coronavirus 2 NEGATIVE NEGATIVE Final    Comment: (NOTE) SARS-CoV-2 target nucleic acids are NOT DETECTED. The SARS-CoV-2 RNA is generally detectable in upper and lower respiratory specimens during the acute phase of infection. The lowest concentration of SARS-CoV-2 viral copies this assay can detect is 250 copies / mL. A negative result does not preclude SARS-CoV-2 infection and should not be used as the sole basis for treatment or other patient management decisions.  A negative result may occur with improper specimen collection / handling, submission of specimen other than nasopharyngeal swab, presence of viral mutation(s) within the areas targeted by this assay, and inadequate number of viral copies (<250 copies / mL). A negative result must be combined with clinical observations, patient history, and epidemiological information. Fact Sheet for Patients:   BoilerBrush.com.cyhttps://www.fda.gov/media/136312/download Fact Sheet for Healthcare Providers: https://pope.com/https://www.fda.gov/media/136313/download This test is not yet approved or cleared  by the  [place] Architectural technologist and has been authorized for detection and/or diagnosis of SARS-CoV-2 by FDA under an Print production planner (EUA).  This EUA will remain in effect (meaning this test can be used) for the duration of the COVID-19 declaration under Section 564(b)(1) of the Act, 21 U.S.C. section 360bbb-3(b)(1), unless the authorization is terminated or revoked sooner. Performed at [facility], [place] [address], [place], [place] [address]   MRSA PCR Screening     Status: None   Collection Time: 12/24/19 10:14 PM   Specimen: Nasal Mucosa; Nasopharyngeal  Result Value Ref Range Status   MRSA by PCR NEGATIVE NEGATIVE Final    Comment:        The GeneXpert MRSA Assay  (FDA approved for NASAL specimens only), is one component of a comprehensive MRSA colonization surveillance program. It is not intended to diagnose MRSA infection nor to guide or monitor treatment for MRSA infections. Performed at [facility], [place] [address], [place], [place] [address]      Labs: BNP (last 3 results) No results for input(s): BNP in the last 8760 hours. Basic Metabolic Panel: Recent Labs  Lab 12/24/19 1339 12/24/19 1349 12/24/19 1849 12/25/19 0719 12/26/19 0233 12/26/19 0715  NA 139 136 139 140  --  139  K 5.8* 5.7* 3.9 3.6  --  4.0  CL 104 106 103 99  --  104  CO2 26  --  25 28  --  25  GLUCOSE 101* 94 124* 120*  --  106*  BUN 11 15 9 10   --  22  CREATININE 1.01 1.00 0.90 0.94  --  1.24  CALCIUM 8.8*  --  9.3 9.4  --  9.3  MG  --   --  1.7  --  1.8  --    Liver Function Tests: Recent Labs  Lab 12/24/19 1339  AST 45*  ALT 15  ALKPHOS 49  BILITOT 1.6*  PROT 6.6  ALBUMIN 3.3*   No results for input(s): LIPASE, AMYLASE in the last 168 hours. No results for input(s): AMMONIA in the last 168 hours. CBC: Recent Labs  Lab 12/24/19 1349 12/24/19 1417  WBC  --  5.0  NEUTROABS  --  2.7  HGB 13.9 13.0  HCT 41.0 42.6  MCV  --  84.4  PLT  --  179   Cardiac Enzymes: No results for input(s): CKTOTAL, CKMB, CKMBINDEX, TROPONINI in the last 168 hours. BNP: Invalid input(s): POCBNP CBG: Recent Labs  Lab 12/24/19 1338 12/25/19 2355  GLUCAP 88 102*   D-Dimer No results for input(s): DDIMER in the last 72 hours. Hgb A1c No results for input(s): HGBA1C in the last 72 hours. Lipid Profile No results for input(s): CHOL, HDL, LDLCALC, TRIG, CHOLHDL, LDLDIRECT in the last 72 hours. Thyroid function studies No results for input(s): TSH, T4TOTAL, T3FREE, THYROIDAB in the last 72 hours.  Invalid input(s): FREET3 Anemia work up No results for input(s): VITAMINB12, FOLATE, FERRITIN, TIBC, IRON, RETICCTPCT in the last 72 hours. Urinalysis     Component Value Date/Time   COLORURINE YELLOW 12/24/2019 1339   APPEARANCEUR CLEAR 12/24/2019 1339   LABSPEC 1.018 12/24/2019 1339   PHURINE 7.0 12/24/2019 1339   GLUCOSEU NEGATIVE 12/24/2019 1339   HGBUR NEGATIVE 12/24/2019 1339   BILIRUBINUR NEGATIVE 12/24/2019 1339   KETONESUR NEGATIVE 12/24/2019 1339   PROTEINUR NEGATIVE 12/24/2019 1339   NITRITE NEGATIVE 12/24/2019 1339   LEUKOCYTESUR TRACE (A) 12/24/2019 1339   Sepsis Labs Invalid input(s): PROCALCITONIN,  WBC,  LACTICIDVEN Microbiology Recent Results (from the past 240 hour(s))  SARS Coronavirus 2 by RT PCR (hospital order, performed in [facility] hospital lab) Nasopharyngeal Nasopharyngeal Swab     Status: None   Collection Time: 12/24/19  6:49 PM   Specimen: Nasopharyngeal Swab  Result Value Ref Range Status   SARS Coronavirus 2 NEGATIVE NEGATIVE Final    Comment: (NOTE) SARS-CoV-2 target nucleic acids are NOT DETECTED. The SARS-CoV-2 RNA is generally detectable in upper and lower respiratory specimens during the acute phase of infection. The lowest concentration of SARS-CoV-2 viral copies this assay can detect is 250 copies / mL. A negative result does not preclude SARS-CoV-2 infection and should not be used as the sole basis for treatment or other patient management decisions.  A negative result may occur with improper specimen collection / handling, submission of specimen other than nasopharyngeal swab, presence of viral mutation(s) within the areas targeted by this assay, and inadequate number of viral copies (<250 copies / mL). A negative result must be combined with clinical observations, patient history, and epidemiological information. Fact Sheet for Patients:   BoilerBrush.com.cy Fact Sheet for Healthcare Providers: https://pope.com/ This test is not yet approved or cleared  by the [place] FDA and has been authorized for detection and/or diagnosis of  SARS-CoV-2 by FDA under an Emergency Use Authorization (EUA).  This EUA will remain in effect (meaning this test can be used) for the duration of the COVID-19 declaration under Section 564(b)(1) of the Act, 21 U.S.C. section 360bbb-3(b)(1), unless the authorization is terminated or revoked sooner. Performed at [facility], [address], [place], [place] [address]   MRSA PCR Screening     Status: None   Collection Time: 12/24/19 10:14 PM   Specimen: Nasal Mucosa; Nasopharyngeal  Result Value Ref Range Status   MRSA by PCR NEGATIVE NEGATIVE Final    Comment:        The GeneXpert MRSA Assay (FDA approved for NASAL specimens only), is one component of a comprehensive MRSA colonization surveillance program. It is not intended to diagnose MRSA infection nor to guide or monitor treatment for MRSA infections. Performed at [facility], [address] [place], [place] [address]      Time coordinating discharge:  35 minutes  SIGNED:   Dorcas Carrow, MD  [facility] 12/29/2019, 1:22 PM

## 2019-12-29 NOTE — Progress Notes (Signed)
Patient arrived on the unit stable and in chair

## 2019-12-29 NOTE — TOC Progression Note (Signed)
Transition of Care [facility]) - Progression Note    Patient Details  Name: Brandon Perkins MRN: [id number] Date of Birth: 1950/08/05  [facility] CM/SW Contact  Terrilee Croak, Student-Social Work Phone Number: 12/29/2019, 10:31 AM  Clinical Narrative:    MSW Intern was notified that family was in the room, and they wanted to discuss discharge options. Daughter at bedside and would like pt to go to SNF, but pt is refusing. PT would like to return home and states he can take care of himself. Daughter and MSW Intern attempted to explain about safety and the rehab process, pt became emotional. Daughter asked for more time to try to convince him. Support given, will follow up after therapies and CIR speak with him.   Expected Discharge Plan: Skilled Nursing Facility Barriers to Discharge: Continued Medical Work up  Expected Discharge Plan and Services Expected Discharge Plan: Skilled Nursing Facility In-house Referral: Clinical Social Work   Post Acute Care Choice: Skilled Nursing Facility Living arrangements for the past 2 months: Single Family Home                                       Social Determinants of Health (SDOH) Interventions    Readmission Risk Interventions No flowsheet data found.

## 2019-12-29 NOTE — PMR Pre-admission (Addendum)
PMR Admission Coordinator Pre-Admission Assessment   Patient: Brandon Perkins is an 69 y.o., male MRN: [id number] DOB: 05/03/1951 Height: 6' 2" (188 cm) Weight: 78.6 kg                                                                                                                                                  Insurance Information HMO:     PPO:      PCP:      IPA:      80/20:      OTHER:  PRIMARY: Medicare A and B      Policy#: [id number]      Subscriber: pt CM Name:       Phone#:      Fax#:  Pre-Cert#: verified online      Employer:  Benefits:  Phone #:      Name:  Eff. Date: A and B 09/03/17     Deduct: $1484      Out of Pocket Max: n/a      Life Max: n/a  CIR: 100%      SNF: 20 full days Outpatient: 80%     Co-Pay: 20% Home Health: 100%      Co-Pay:  DME: 80%     Co-Pay: 20% Providers:  SECONDARY:       Policy#:       Phone#:    Financial Counselor:       Phone#:    The "Data Collection Information Summary" for patients in Inpatient Rehabilitation Facilities with attached "Privacy Act Statement-Health Care Records" was provided and verbally reviewed with: Patient and Family   Emergency Contact Information Contact Information     Name Relation Home Work Mobile    Cummings,Damien Son [phone]        Landcaster,Felicia Daughter     [phone]       Current Medical History  Patient Admitting Diagnosis: CVA    History of Present Illness:  Brandon Perkins is a 69-year-old right-handed male with history of hypertension. He presented on 12/24/19 with dysarthria and AMS. Elevated blood pressure systolics in the 200s and placed on Cleviprex.  Cranial CT scan unremarkable for acute intracranial process. 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  Admission chemistries with  potassium 5.8, alcohol negative, SARS coronavirus negative, urinalysis negative nitrite.  Echocardiogram with EF of 60%, grade 1 diastolic dysfunction. Maintain on aspirin and Plavix for CVA prophylaxis x3 months then aspirin alone.  Subcutaneous Lovenox for DVT prophylaxis.  Tolerating a regular diet.  Therapy evaluations completed and patient was recommended for a comprehensive rehab program.   Complete NIHSS TOTAL: 1 Glasgow Coma Scale Score: 15     Past Medical History      Past Medical History:  Diagnosis Date  . Blood clot in vein      left arm vein blood clot, not know the detail, treated with ASA per pt  . Cataract      OU  . Hypertension    . Hypertensive retinopathy      OU      Family History  family history includes Colon cancer in his father; Dementia in his mother; Diabetes in his brother; Stroke in his sister.   Prior Rehab/Hospitalizations:  Has the patient had prior rehab or hospitalizations prior to admission? No   Has the patient had major surgery during 100 days prior to admission? No   Current Medications    Current Facility-Administered Medications:  .   stroke: mapping our early stages of recovery book, , Does not apply, Once, Agarwala, Ravi, MD, Stopped at 12/24/19 1910 .  amLODipine (NORVASC) tablet 10 mg, 10 mg, Oral, Daily, Agarwala, Ravi, MD, 10 mg at 12/29/19 0922 .  aspirin EC tablet 81 mg, 81 mg, Oral, Daily, Agarwala, Ravi, MD, 81 mg at 12/29/19 0923 .  atorvastatin (LIPITOR) tablet 40 mg, 40 mg, Oral, Daily, Agarwala, Ravi, MD, 40 mg at 12/29/19 0922 .  Chlorhexidine Gluconate Cloth 2 % PADS 6 each, 6 each, Topical, Daily, Agarwala, Ravi, MD, 6 each at 12/29/19 0923 .  chlorthalidone (HYGROTON) tablet 25 mg, 25 mg, Oral, Daily, Agarwala, Ravi, MD, 25 mg at 12/29/19 0922 .  clopidogrel (PLAVIX) tablet 75 mg, 75 mg, Oral, Daily, Agarwala, Ravi, MD, 75 mg at 12/29/19 0923 .  docusate sodium (COLACE) capsule 100 mg, 100 mg, Oral, BID PRN, Agarwala, Ravi,  MD .  enoxaparin (LOVENOX) injection 40 mg, 40 mg, Subcutaneous, Q24H, Agarwala, Ravi, MD, 40 mg at 12/28/19 2055 .  lisinopril (ZESTRIL) tablet 10 mg, 10 mg, Oral, Daily, Agarwala, Ravi, MD, 10 mg at 12/29/19 0922 .  polyethylene glycol (MIRALAX / GLYCOLAX) packet 17 g, 17 g, Oral, Daily PRN, Agarwala, Ravi, MD .  QUEtiapine (SEROQUEL) tablet 12.5 mg, 12.5 mg, Oral, BID BM & HS PRN, Biby, Sharon L, NP   Patients Current Diet:     Diet Order                      Diet - low sodium heart healthy           Diet Heart Room service appropriate? Yes; Fluid consistency: Thin  Diet effective now                   Precautions / Restrictions Precautions Precautions: Fall Restrictions Weight Bearing Restrictions: No    Has the patient had 2 or more falls or a fall with injury in the past year?Yes   Prior Activity Level Community (5-7x/wk): independent with SPC prior to admission, not driving   Prior Functional Level Prior Function Level of Independence: Independent with assistive device(s) Comments: pt reports using SPC for mobility tasks, reports friend typically assists with driving/getting groceries (reports he doesn't like going to the store). Was performing iADL tasks at home    Self Care: Did the patient need help bathing, dressing, using the toilet or eating?  Independent   Indoor Mobility: Did the patient need assistance with walking from room to room (with or without device)? Independent   Stairs: Did the patient need assistance with internal or external stairs (with or without device)? Independent   Functional Cognition: Did the patient need help   planning regular tasks such as shopping or remembering to take medications? Independent   Home Assistive Devices / Equipment Home Equipment: Cane - single point   Prior Device Use: Indicate devices/aids used by the patient prior to current illness, exacerbation or injury? spc   Current Functional Level Cognition    Arousal/Alertness: Awake/alert Overall Cognitive Status: Impaired/Different from baseline Current Attention Level: Selective Orientation Level: Oriented to person Following Commands: Follows multi-step commands consistently General Comments: Pt soft spoken Attention: Selective Selective Attention: Impaired Selective Attention Impairment: Verbal basic Memory: Impaired Memory Impairment: Retrieval deficit, Decreased recall of new information Awareness: Appears intact(intellectual awareness appears to be intact) Problem Solving: Impaired Problem Solving Impairment: Verbal complex, Verbal basic    Extremity Assessment (includes Sensation/Coordination)   Upper Extremity Assessment: Overall WFL for tasks assessed LUE Deficits / Details: LUE grossly weaker than RUE (3+/5) LUE Sensation: (denies sensation changes) LUE Coordination: WNL  Lower Extremity Assessment: Overall WFL for tasks assessed     ADLs   Overall ADL's : Needs assistance/impaired Eating/Feeding: NPO Grooming: Wash/dry hands, Wash/dry face, Standing, Min guard Upper Body Bathing: Set up, Min guard, Sitting Lower Body Bathing: Minimal assistance, +2 for safety/equipment, Sit to/from stand Upper Body Dressing : Set up, Min guard, Sitting Lower Body Dressing: Min guard, Sit to/from stand Lower Body Dressing Details (indicate cue type and reason): pt was able to doff/donn socks seated in recliner via figure 4; min A-guard sit to stand  Toilet Transfer: Minimal assistance, Ambulation, RW Toilet Transfer Details (indicate cue type and reason): simulated via transfer to recliner Toileting- Clothing Manipulation and Hygiene: Minimal assistance, Sit to/from stand, Sitting/lateral lean Functional mobility during ADLs: Minimal assistance, Rolling walker General ADL Comments: pt with decreased UE coordination     Mobility   Overal bed mobility: Needs Assistance Bed Mobility: Supine to Sit Supine to sit: Min guard, HOB  elevated General bed mobility comments: pt OOB in chair upon arrival      Transfers   Overall transfer level: Needs assistance Equipment used: Straight cane Transfers: Sit to/from Stand Sit to Stand: Min assist General transfer comment: for safety/balance     Ambulation / Gait / Stairs / Wheelchair Mobility   Ambulation/Gait Ambulation/Gait assistance: Min assist Gait Distance (Feet): (100 ft X 2 with standing break) Assistive device: Straight cane, Rolling walker (2 wheeled) Gait Pattern/deviations: Step-to pattern, Step-through pattern, Shuffle, Wide base of support General Gait Details: initially ambulating in room with SPC and then use of RW due to instability; cues for safe use of AD and assistance for balance especially with turning  Gait velocity: decreased     Posture / Balance Dynamic Sitting Balance Sitting balance - Comments: donning socks in recliner Balance Overall balance assessment: Needs assistance Sitting-balance support: Feet supported Sitting balance-Leahy Scale: Good Sitting balance - Comments: donning socks in recliner Standing balance support: Bilateral upper extremity supported, Single extremity supported, During functional activity Standing balance-Leahy Scale: Fair Standing balance comment: needs UE support for balance High level balance activites: Direction changes, Turns High Level Balance Comments: Pt did reaching activity while standing at sink with RW     Special needs/care consideration Diabetic management yes and Designated visitor daughter Felicia Landcaster and son Damien Cummings        Previous Home Environment (from acute therapy documentation) Living Arrangements: Alone  Lives With: Alone Available Help at Discharge: Family Type of Home: House Home Layout: One level Home Access: Stairs to enter Entrance Stairs-Rails: Right Entrance Stairs-Number of Steps: 3 Bathroom Shower/Tub: Tub/shower   unit Bathroom Toilet: Standard   Discharge  Living Setting Plans for Discharge Living Setting: Patient's home, Alone Type of Home at Discharge: House Discharge Home Layout: One level Discharge Home Access: Stairs to enter Entrance Stairs-Rails: Right Entrance Stairs-Number of Steps: 3 Discharge Bathroom Shower/Tub: Tub/shower unit Discharge Bathroom Toilet: Standard Discharge Bathroom Accessibility: Yes How Accessible: Accessible via walker Does the patient have any problems obtaining your medications?: No   Social/Family/Support Systems Anticipated Caregiver: Felicia Landcaster (dtr), Damien Cummings (son) Anticipated Caregiver's Contact Information: Felicia [phone], Damien [phone] Ability/Limitations of Caregiver: Felicia works during the day, Damien works at night Caregiver Availability: Intermittent Discharge Plan Discussed with Primary Caregiver: Yes Is Caregiver In Agreement with Plan?: Yes Does Caregiver/Family have Issues with Lodging/Transportation while Pt is in Rehab?: No     Goals Patient/Family Goal for Rehab: PT/OT mod I, SLP supervision Expected length of stay: 4-6 days Pt/Family Agrees to Admission and willing to participate: Yes Program Orientation Provided & Reviewed with Pt/Caregiver Including Roles  & Responsibilities: Yes     Decrease burden of Care through IP rehab admission: n/a     Possible need for SNF placement upon discharge: Not anticipated.      Patient Condition: This patient's medical and functional status has changed since the consult dated: 12/26/19 in which the Rehabilitation Physician determined and documented that the patient's condition is appropriate for intensive rehabilitative care in an inpatient rehabilitation facility. See "History of Present Illness" (above) for medical update. Functional changes are: min assist x100'. Patient's medical and functional status update has been discussed with the Rehabilitation physician and patient remains appropriate for inpatient  rehabilitation. Will admit to inpatient rehab today.   Preadmission Screen Completed By:  Caitlin E Warren, PT, DPT 12/29/2019 1:24 PM ______________________________________________________________________   Discussed status with Dr. Torey Regan on 12/29/19 at  1:28 PM.  and received approval for admission today.   Admission Coordinator:  Caitlin E Warren,PT, DPT time 1:28 PM /Date 12/29/19    

## 2019-12-29 NOTE — Plan of Care (Signed)
  Problem: RH SAFETY Goal: RH STG ADHERE TO SAFETY PRECAUTIONS W/ASSISTANCE/DEVICE Description: STG Adhere to Safety Precautions With min assist  Outcome: Progressing Goal: RH STG DECREASED RISK OF FALL WITH ASSISTANCE Description: STG Decreased Risk of Fall With mod I assist   Outcome: Progressing

## 2019-12-29 NOTE — Progress Notes (Signed)
I spoke with Inda Merlin, pts son, related to his work schedule. He said the best time to reach him is later in the afternoon about 4pm because he works night shift. His sister Marti Sleigh is available during daytime hours.

## 2019-12-30 ENCOUNTER — Inpatient Hospital Stay (HOSPITAL_COMMUNITY): Payer: Medicare Other

## 2019-12-30 DIAGNOSIS — I63541 Cerebral infarction due to unspecified occlusion or stenosis of right cerebellar artery: Secondary | ICD-10-CM

## 2019-12-30 NOTE — Evaluation (Signed)
Speech Language Pathology Assessment and Plan  Patient Details  Name: Brandon Perkins MRN: [id number] Date of Birth: Mar 04, 1951  SLP Diagnosis: Cognitive Impairments;Dysarthria  Rehab Potential: Good ELOS: 7-10 days    Today's Date: 12/30/2019 SLP Individual Time: 1435-1550 SLP Individual Time Calculation (min): 75 min   Problem List:  Patient Active Problem List   Diagnosis Date Noted  . Cerebral infarction involving right cerebellar artery ([place]) 12/29/2019  . Benign essential HTN   . Dyslipidemia   . Dysarthria, post-stroke   . Cerebral thrombosis with cerebral infarction 12/25/2019  . Hypertensive emergency 12/24/2019  . Hypokalemia 11/07/2014  . Hypertensive urgency 11/07/2014  . Elevated blood pressure 11/06/2014  . Syncope, near 11/06/2014  . GI bleeding 11/06/2014  . Hand laceration 11/06/2014   Past Medical History:  Past Medical History:  Diagnosis Date  . Blood clot in vein    left arm vein blood clot, not know the detail, treated with ASA per pt  . Cataract    OU  . Hypertension   . Hypertensive retinopathy    OU   Past Surgical History: History reviewed. No pertinent surgical history.  Assessment / Plan / Recommendation Clinical Impression Brandon Perkins is a 69 year old right-handed male with history of hypertension. History taken form chart review and patient due to cognition. Patient lives alone independent with assistive device. 1 level home 3 steps to entry. He presented on 12/24/19 with dysarthria and AMS. Elevated blood pressure systolics in the 712W and placed on Cleviprex. Cranial CT scan unremarkable for acute intracranial process. 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 Admission chemistries with potassium 5.8, alcohol negative, SARS coronavirus  negative, urinalysis negative nitrite. Echocardiogram with EF of 58%, grade 1 diastolic dysfunction. Maintain on aspirin and Plavix for CVA prophylaxis x3 months then aspirin alone. Subcutaneous Lovenox for DVT prophylaxis. Tolerating a regular diet. Therapy evaluations completed and patient was admitted for a comprehensive rehab program.  Pt demonstrated severe to moderate cognitive linguistic impairments, deficits include selective attention (due to internal distractions/topic maintenance), mildly complex problem solving, executive function/judgement, delayed processing, short term recall and intellectual awareness. SLP administered cognitive linguistic assessment Cognistat, pt demonstrated severe deficits in short term recall, moderate-severe deficits in construction task, moderate deficits in similarties (abstract language) and mild deficits following multistep commands, calculations and judgement. Pt demonstrated intermittent intellectual awareness of cognitive, physical and speech deficits. Pt demonstrated phonemic errors in repetition and limited verbal description during subsections on Cognistat, but naming tasks were [facility] as well as in conversation. Pt demonstrates moderate dysarthria characterized by imprecise articulation, reduced oral movement, rapid rate and intermittent reduced vocal intensity. Pt presents with 50-70% intelligibility at phrase level with little awareness of poor intelligibility and denies acute speech changes, supporting baseline deficits (not noted in chart.) Pt demonstrated difficulty controlling execess salvia secretions, absorbing with a towel. Pt did not want to swallow the secretions and SLP provided a cup to expectorate secretions. Pt expressed difficulty swallowing and globus sensation. SLP conducted BSS, in which oral motor exam was unremarkable. Pt consumed regular textures and thin via cup/straw snack with no overt s/s aspiration. Pt's demonstrated appropriate oral  clearance and swallow appeared timely. SLP educated pt on swallow strategies for possible GRED and general aspiration. SLP does not recommend services for dysphagia at this time. SLP will focus  on cognitive and speech skills during short length of stay. Pt would benefit from skills ST services in order to maximize functional independence and reduce burden of care, requring 24 hour supervision and continued ST services.   Skilled Therapeutic Interventions          Skilled ST services focused on speech skills. SLP facilitated speech and cognitive lingustic assessment, provided education on current deficits and plan for treatment. Pt denies changes in speech intelligibility from acute CVA, stating baseline deficits. Pt allowed SLP to record part of a conversation on his phone to aid in biofeedback and intellectual awareness, however the volume button was not working and pt could not hear recording. SLP deleted video with permission from pt. Pt attempted to call a friend to confirm baseline verse acute speech changes, however friend was not available. SLP instructed pt in speech intelligibility strategies to increase intelligibility with slow rate, opening oral cavity and over articulation, pt demonstrated ability to increase intelligibility with max-mod A verbal cues for strategies. All questions answered to satisfaction. Pt was left in room with call bell within reach and chair alarm set. ST recommends to continue skilled ST services.    SLP Assessment  Patient will need skilled Speech Lanaguage Pathology Services during CIR admission    Recommendations  SLP Diet Recommendations: Thin Liquid Administration via: Spoon;Cup Medication Administration: Whole meds with liquid Supervision: Patient able to self feed Compensations: Minimize environmental distractions;Small sips/bites;Slow rate;Other (Comment)(alternate liquids and solids) Postural Changes and/or Swallow Maneuvers: Seated upright 90 degrees Oral  Care Recommendations: Oral care BID Patient destination: Home Follow up Recommendations: Home Health SLP;24 hour supervision/assistance Equipment Recommended: None recommended by SLP    SLP Frequency 3 to 5 out of 7 days   SLP Duration  SLP Intensity  SLP Treatment/Interventions 7-10 days  Minumum of 1-2 x/day, 30 to 90 minutes  Cognitive remediation/compensation;Cueing hierarchy;Functional tasks;Internal/external aids;Speech/Language facilitation    Pain Pain Assessment Pain Score: 0-No pain  Prior Functioning Cognitive/Linguistic Baseline: Baseline deficits Baseline deficit details: pt states baseline speech deficits and deines acute changes, not noted in chart Type of Home: House  Lives With: Alone Available Help at Discharge: Family;Available PRN/intermittently Education: high school Vocation: Part time employment  SLP Evaluation Cognition Overall Cognitive Status: Impaired/Different from baseline Arousal/Alertness: Awake/alert Orientation Level: Oriented X4 Attention: Selective Selective Attention: Impaired Selective Attention Impairment: Verbal basic Memory: Impaired Memory Impairment: Retrieval deficit;Decreased recall of new information Awareness: Impaired Awareness Impairment: Intellectual impairment Problem Solving: Impaired Problem Solving Impairment: Functional complex;Verbal complex Safety/Judgment: Impaired  Comprehension Auditory Comprehension Overall Auditory Comprehension: Impaired Commands: Impaired One Step Basic Commands: 75-100% accurate Two Step Basic Commands: 50-74% accurate Multistep Basic Commands: 50-74% accurate Conversation: Simple Interfering Components: Attention EffectiveTechniques: Extra processing time Visual Recognition/Discrimination Discrimination: Within Function Limits Reading Comprehension Reading Status: Within funtional limits Expression Expression Primary Mode of Expression: Verbal Verbal Expression Overall  Verbal Expression: Impaired Initiation: No impairment Automatic Speech: Name;Social Response Level of Generative/Spontaneous Verbalization: Phrase Repetition: Impaired Level of Impairment: Phrase level;Sentence level(phonetic errors) Naming: No impairment Interfering Components: Speech intelligibility Written Expression Dominant Hand: Right Oral Motor Oral Motor/Sensory Function Overall Oral Motor/Sensory Function: Mild impairment Facial ROM: Within Functional Limits Facial Symmetry: Within Functional Limits Facial Strength: Within Functional Limits Facial Sensation: Reduced right Lingual ROM: Within Functional Limits Lingual Symmetry: Within Functional Limits Lingual Strength: Within Functional Limits Velum: Within Functional Limits Mandible: Within Functional Limits Motor Speech Overall Motor Speech: Impaired Respiration: Within functional limits Phonation: Low vocal intensity Resonance: Within functional limits Articulation:  Impaired Level of Impairment: Phrase Intelligibility: Intelligibility reduced Word: 75-100% accurate Phrase: 50-74% accurate Motor Planning: Witnin functional limits Motor Speech Errors: Not applicable Effective Techniques: Increased vocal intensity;Over-articulate;Slow rate   PMSV Assessment  PMSV Trial Intelligibility: Intelligibility reduced Word: 75-100% accurate Phrase: 50-74% accurate  Bedside Swallowing Assessment General Diet Prior to this Study: Regular;Thin liquids Behavior/Cognition: Alert;Cooperative Oral Cavity - Dentition: Adequate natural dentition;Missing dentition Self-Feeding Abilities: Able to feed self Patient Positioning: Upright in chair/Tumbleform Baseline Vocal Quality: Normal Volitional Cough: Strong Volitional Swallow: Able to elicit  Oral Care Assessment Does patient have any of the following "high(er) risk" factors?: None of the above Does patient have any of the following "at risk" factors?: None of the  above Patient is AT RISK: Order set for Adult Oral Care Protocol initiated -  "At Risk Patients" option selected (see row information) Patient is LOW RISK: Follow universal precautions (see row information) Ice Chips Ice chips: Not tested Thin Liquid Thin Liquid: Within functional limits Presentation: Straw;Self Fed Nectar Thick Nectar Thick Liquid: Not tested Honey Thick Honey Thick Liquid: Not tested Puree Puree: Not tested Solid Solid: Within functional limits Presentation: Self Fed BSE Assessment Risk for Aspiration Impact on safety and function: Mild aspiration risk Other Related Risk Factors: Decreased management of secretions  Short Term Goals: Week 1: SLP Short Term Goal 1 (Week 1): STG=LTG due to short ELOS  Refer to Care Plan for Long Term Goals  Recommendations for other services: None   Discharge Criteria: Patient will be discharged from SLP if patient refuses treatment 3 consecutive times without medical reason, if treatment goals not met, if there is a change in medical status, if patient makes no progress towards goals or if patient is discharged from hospital.  The above assessment, treatment plan, treatment alternatives and goals were discussed and mutually agreed upon: by patient  Alegria Dominique  [facility] 12/30/2019, 4:32 PM

## 2019-12-30 NOTE — Evaluation (Signed)
Physical Therapy Assessment and Plan  Patient Details  Name: Brandon Perkins MRN: [id number] Date of Birth: 04/08/1951  PT Diagnosis: Abnormal posture, Abnormality of gait, Difficulty walking, Hemiparesis non-dominant and Muscle weakness Rehab Potential: Good ELOS: 7-10 days   Today's Date: 12/30/2019 PT Individual Time: 1100-1155 PT Individual Time Calculation (min): 55 min    Problem List:  Patient Active Problem List   Diagnosis Date Noted  . Cerebral infarction involving right cerebellar artery ([place]) 12/29/2019  . Benign essential HTN   . Dyslipidemia   . Dysarthria, post-stroke   . Cerebral thrombosis with cerebral infarction 12/25/2019  . Hypertensive emergency 12/24/2019  . Hypokalemia 11/07/2014  . Hypertensive urgency 11/07/2014  . Elevated blood pressure 11/06/2014  . Syncope, near 11/06/2014  . GI bleeding 11/06/2014  . Hand laceration 11/06/2014    Past Medical History:  Past Medical History:  Diagnosis Date  . Blood clot in vein    left arm vein blood clot, not know the detail, treated with ASA per pt  . Cataract    OU  . Hypertension   . Hypertensive retinopathy    OU   Past Surgical History: History reviewed. No pertinent surgical history.  Assessment & Plan Clinical Impression: Patient is a 69 y.o. year old male with history of hypertension. He presented on 12/24/19 with dysarthria and AMS. Elevated blood pressure systolics in the 403K and placed on Cleviprex.  Cranial CT scan unremarkable for acute intracranial process. 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  Admission chemistries with potassium 5.8, alcohol negative, SARS coronavirus negative, urinalysis negative nitrite.  Echocardiogram with EF of 74%, grade 1 diastolic dysfunction. Maintain on  aspirin and Plavix for CVA prophylaxis x3 months then aspirin alone.  Subcutaneous Lovenox for DVT prophylaxis.  Tolerating a regular diet.  Therapy evaluations completed and patient was recommended for a comprehensive rehab program.  Patient currently requires min with mobility secondary to muscle weakness, impaired timing and sequencing, decreased coordination and decreased motor planning and decreased standing balance, decreased postural control, hemiplegia and decreased balance strategies.  Prior to hospitalization, patient was modified independent  with mobility and lived with Alone in a House home. Home access is 3Stairs to enter.  Patient will benefit from skilled PT intervention to maximize safe functional mobility, minimize fall risk and decrease caregiver burden for planned discharge home with intermittent assist.  Anticipate patient will benefit from follow up [facility] at discharge.  PT - End of Session Activity Tolerance: Tolerates 30+ min activity with multiple rests Endurance Deficit: Yes Endurance Deficit Description: required rest breaks throughout session PT Assessment Rehab Potential (ACUTE/IP ONLY): Good PT Barriers to Discharge: Lack of/limited family support;Decreased caregiver support PT Barriers to Discharge Comments: family is only able to provide intermittent assist PT Patient demonstrates impairments in the following area(s): Balance;Endurance;Motor PT Transfers Functional Problem(s): Bed Mobility;Bed to Chair;Car;Furniture PT Locomotion Functional Problem(s): Ambulation;Wheelchair Mobility;Stairs PT Plan PT Intensity: Minimum of 1-2 x/day ,45 to 90 minutes PT Frequency: 5 out of 7 days PT Duration Estimated Length of Stay: 7-10 days PT Treatment/Interventions: Ambulation/gait training;Discharge planning;Functional mobility training;Psychosocial support;Therapeutic Activities;Balance/vestibular training;Disease management/prevention;Neuromuscular re-education;Skin care/wound  management;Therapeutic Exercise;Wheelchair propulsion/positioning;Cognitive remediation/compensation;DME/adaptive equipment instruction;Pain management;Splinting/orthotics;UE/LE Strength taining/ROM;Community reintegration;Functional electrical stimulation;Patient/family education;Stair training;UE/LE Coordination activities PT Transfers Anticipated Outcome(s): Mod I with LRAD PT Locomotion Anticipated Outcome(s): Supervision with LRAD  PT Recommendation Follow Up Recommendations: Home health PT Patient destination: Home Equipment Recommended: To be determined Equipment Details: has [facility]  Skilled Therapeutic Intervention Evaluation completed (see details above and below) with education on PT POC and goals and individual treatment initiated with focus on functional mobility/transfers, generalized strengthening, dynamic standing balance/coordination, ambulation, simulated car transfers, stair navigation, and improved activity tolerance. Received pt sitting in recliner, pt educated on PT evaluation, CIR policies, and therapy schedule and agreeable. Pt denied any pain during session. Pt transferred stand<>pivot recliner<>bed without AD min A with cues for turning technique. Pt transferred sit<>supine, rolled to L/R, and transferred supine<>sit with supervision and increased time. Pt transferred stand<>pivot bed<>WC without AD min A. Pt performed WC mobility 8f using bilateral LEs and supervision to therapy gym. Pt performed simulated car transfer from [place] min A. Pt ambulated 649fwithout AD min A. Pt demonstrated decreased stride length, decreased bilateral foot clearance, shuffling gait pattern, and decreased trunk rotation. Pt navigated 12 steps with 2 rails with min A ascending with a step through pattern and descending with a step to pattern. Pt [facility] and ambulated additional 1060fithout AD min A back to recliner. Concluded session with pt sitting in  recliner, needs within reach, and chair pad alarm on.   PT Evaluation Precautions/Restrictions Precautions Precautions: Fall Restrictions Weight Bearing Restrictions: No Home Living/Prior Functioning Home Living Available Help at Discharge: Family;Available PRN/intermittently Type of Home: House Home Access: Stairs to enter [employer] Steps: 3 Entrance Stairs-Rails: Right;Can reach both;Left Home Layout: One level Bathroom Shower/Tub: TubChiropodisttandard Bathroom Accessibility: Yes Additional Comments: pt is unsure if WC will fit through door  Lives With: Alone Prior Function Level of Independence: Requires assistive device for independence;Independent with basic ADLs;Independent with homemaking with ambulation  Able to Take Stairs?: Yes Driving: No Comments: pt reports using SPC for mobility tasks, reports friend typically assists with driving/getting groceries (reports he doesn't like going to the store). Was performing iADL tasks at home  Cognition Overall Cognitive Status: Impaired/Different from baseline Arousal/Alertness: Awake/alert Orientation Level: Oriented X4 Memory: Impaired Awareness: Appears intact Problem Solving: Appears intact Safety/Judgment: Appears intact Sensation Sensation Light Touch: Appears Intact Proprioception: Appears Intact Coordination Gross Motor Movements are Fluid and Coordinated: No Fine Motor Movements are Fluid and Coordinated: No Coordination and Movement Description: grossly uncoordinated due to LE weakness, decreased postural control/balance, and decreased endurance Finger Nose Finger Test: dysmetria bilaterally Heel Shin Test: decreased bilaterally Motor  Motor Motor: Hemiplegia;Abnormal postural alignment and control Motor - Skilled Clinical Observations: mild hemiparesis, LE weakness, decreased postural control/balance, and decreased endurance  Mobility Bed Mobility Bed Mobility: Rolling  Right;Rolling Left;Sit to Supine;Supine to Sit Rolling Right: Supervision/verbal cueing Rolling Left: Supervision/Verbal cueing Supine to Sit: Supervision/Verbal cueing Sit to Supine: Supervision/Verbal cueing Transfers Transfers: Sit to Stand;Stand to Sit;Stand Pivot Transfers Sit to Stand: Minimal Assistance - Patient > 75% Stand to Sit: Minimal Assistance - Patient > 75% Stand Pivot Transfers: Minimal Assistance - Patient > 75% Stand Pivot Transfer Details: Verbal cues for sequencing;Verbal cues for technique Stand Pivot Transfer Details (indicate cue type and reason): verbal cues for turning technique and safety Transfer (Assistive device): None Locomotion  Gait Ambulation: Yes Gait Assistance: Minimal Assistance - Patient > 75% Gait Distance (Feet): 60 Feet Assistive device: None Gait Assistance Details: Verbal cues for sequencing;Verbal cues for gait pattern;Verbal cues for precautions/safety Gait Assistance Details: verbal cues to increase stride length for safety  Gait Gait: Yes Gait Pattern: Impaired Gait Pattern: Decreased dorsiflexion - right;Decreased dorsiflexion - left;Decreased trunk rotation;Shuffle;Decreased stride length;Decreased step length - right;Decreased step length - left;Poor foot clearance - left;Poor foot clearance - right;Narrow base of support Gait velocity: decreased Stairs / Additional Locomotion Stairs: Yes Stairs Assistance: Minimal Assistance - Patient > 75% Stair Management Technique: Two rails Number of Stairs: 12 Height of Stairs: 6 Wheelchair Mobility Wheelchair Mobility: Yes Wheelchair Assistance: Chartered loss adjuster: Both lower extermities Wheelchair Parts Management: Needs assistance Distance: 76f  Trunk/Postural Assessment  Cervical Assessment Cervical Assessment: Exceptions to WFL(forward head) Thoracic Assessment Thoracic Assessment: Exceptions to WFL(rounded shoulders) Lumbar Assessment Lumbar  Assessment: Exceptions to WFL(posterior pelvic tilt) Postural Control Postural Control: Deficits on evaluation  Balance Balance Balance Assessed: Yes Static Sitting Balance Static Sitting - Balance Support: No upper extremity supported;Feet supported Static Sitting - Level of Assistance: 5: Stand by assistance(supervision) Dynamic Sitting Balance Dynamic Sitting - Balance Support: No upper extremity supported;Feet supported Dynamic Sitting - Level of Assistance: 5: Stand by assistance(supervision) Static Standing Balance Static Standing - Balance Support: No upper extremity supported Static Standing - Level of Assistance: 5: Stand by assistance(CGA) Dynamic Standing Balance Dynamic Standing - Balance Support: No upper extremity supported Dynamic Standing - Level of Assistance: 4: Min assist Extremity Assessment  RLE Assessment RLE Assessment: Exceptions to WMinn[facility] LLCGeneral Strength Comments: grossly generalized to 4-/5 LLE Assessment LLE Assessment: Exceptions to WCuyuna [facility] Medical CenterGeneral Strength Comments: grossly generalized to 4-/5 (except hip flexion 3+/5)   Refer to Care Plan for Long Term Goals  Recommendations for other services: None   Discharge Criteria: Patient will be discharged from PT if patient refuses treatment 3 consecutive times without medical reason, if treatment goals not met, if there is a change in medical status, if patient makes no progress towards goals or if patient is discharged from hospital.  The above assessment, treatment plan, treatment alternatives and goals were discussed and mutually agreed upon: by patient  AAlfonse AlpersPT, DPT  12/30/2019, 7:24 AM

## 2019-12-30 NOTE — Evaluation (Signed)
Occupational Therapy Assessment and Plan  Patient Details  Name: Brandon Perkins MRN: [id number] Date of Birth: 12/16/1950  OT Diagnosis: abnormal posture, cognitive deficits, hemiplegia affecting non-dominant side and muscle weakness (generalized) Rehab Potential:   ELOS: 7-10   Today's Date: 12/30/2019 OT Individual Time: 7342-8768 OT Individual Time Calculation (min): 75 min     Problem List:  Patient Active Problem List   Diagnosis Date Noted  . Cerebral infarction involving right cerebellar artery ([place]) 12/29/2019  . Benign essential HTN   . Dyslipidemia   . Dysarthria, post-stroke   . Cerebral thrombosis with cerebral infarction 12/25/2019  . Hypertensive emergency 12/24/2019  . Hypokalemia 11/07/2014  . Hypertensive urgency 11/07/2014  . Elevated blood pressure 11/06/2014  . Syncope, near 11/06/2014  . GI bleeding 11/06/2014  . Hand laceration 11/06/2014    Past Medical History:  Past Medical History:  Diagnosis Date  . Blood clot in vein    left arm vein blood clot, not know the detail, treated with ASA per pt  . Cataract    OU  . Hypertension   . Hypertensive retinopathy    OU   Past Surgical History: History reviewed. No pertinent surgical history.  Assessment & Plan Clinical Impression:69 yo male with PMHx of poorly-controlled HTN, GIB, hypertensive retinopathy, cataract, unspecified "arm clot" who presented to UC with slurred speech, weakness, confusion. At UC, SBP > 200, referred to ED, and found to have SBP >240. 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   Patient currently requires min with basic self-care skills secondary to muscle weakness, decreased cardiorespiratoy endurance, decreased coordination and decreased motor planning, decreased initiation, decreased attention, decreased awareness, decreased safety awareness, decreased memory, and delayed processing, and decreased standing balance, decreased postural control, hemiplegia, and  decreased balance strategies.  Prior to hospitalization, patient could complete BADL/IADL with modified independent .  Patient will benefit from skilled intervention to decrease level of assist with basic self-care skills and increase independence with basic self-care skills prior to discharge home with care partner.  Anticipate patient will require 24 hour supervision and follow up home health.  OT - End of Session Endurance Deficit: Yes Endurance Deficit Description: required rest breaks throughout session OT Assessment Rehab Potential (ACUTE ONLY): Good OT Barriers to Discharge: Decreased caregiver support;Lack of/limited family support OT Barriers to Discharge Comments: likely will require 24/7 S OT Patient demonstrates impairments in the following area(s): Balance;Cognition;Endurance;Motor;Safety OT Basic ADL's Functional Problem(s): Grooming;Bathing;Dressing;Toileting OT Transfers Functional Problem(s): Toilet;Tub/Shower OT Plan OT Intensity: Minimum of 1-2 x/day, 45 to 90 minutes OT Frequency: 5 out of 7 days OT Duration/Estimated Length of Stay: 7-10 OT Treatment/Interventions: Balance/vestibular training;Discharge planning;Pain management;Self Care/advanced ADL retraining;Therapeutic Activities;UE/LE Coordination activities;Visual/perceptual remediation/compensation;Therapeutic Exercise;Skin care/wound managment;Patient/family education;Functional mobility training;Disease mangement/prevention;Cognitive remediation/compensation;Community reintegration;DME/adaptive equipment instruction;Neuromuscular re-education;Psychosocial support;UE/LE Strength taining/ROM OT Self Feeding Anticipated Outcome(s): S OT Basic Self-Care Anticipated Outcome(s): S OT Toileting Anticipated Outcome(s): S OT Bathroom Transfers Anticipated Outcome(s): S OT Recommendation Patient destination: Home Follow Up Recommendations: Home health OT Equipment Recommended: Tub/shower seat;To be determined;3 in 1  bedside comode   Skilled Therapeutic Intervention 1:1. Pt received in recliner working on breakfast. No report of apin. Edu re OT role/purpose, CIR, ELOS and POC. Pt completes all ADLs and transfers with MIN A for standing balance at shower level sit to stand and seated on BSC for dressing sit to stand. Pt requires increased time for processing, initiation, and sequencing with min VC. Pt with mild L hemi in UE but remains functional throughout ADL.  Pt reporting has tub with glass doors and will likely requires supervision for bathing/Tub transfer tasks at home. Exited session with pt seated in recliner, call light in reach and all needs met  OT Evaluation Precautions/Restrictions  Precautions Precautions: Fall Restrictions Weight Bearing Restrictions: No General Chart Reviewed: Yes Family/Caregiver Present: No Vital Signs Therapy Vitals Temp: 97.9 F (36.6 C) Pulse Rate: 69 Resp: 19 BP: (!) 153/87 Patient Position (if appropriate): Lying Oxygen Therapy SpO2: 98 % O2 Device: Room Air Pain Pain Assessment Pain Score: 0-No pain Home Living/Prior Functioning Home Living Family/patient expects to be discharged to:: Private residence Living Arrangements: Alone Available Help at Discharge: Family Type of Home: House Home Access: Stairs to enter Technical brewer of Steps: 3 Entrance Stairs-Rails: Right Home Layout: One level Bathroom Shower/Tub: Chiropodist: Standard  Lives With: Alone Prior Function Level of Independence: Independent with basic ADLs, Independent with homemaking with ambulation, Requires assistive device for independence(SPC) Comments: pt reports using SPC for mobility tasks, reports friend typically assists with driving/getting groceries (reports he doesn't like going to the store). Was performing iADL tasks at home  ADL   Vision Baseline Vision/History: No visual deficits;Cataracts Patient Visual Report: No change from  baseline Additional Comments: will continue to assess but able to read small print with increased time which pt reports is baseline Perception  Perception: Within Functional Limits Praxis Praxis: Impaired Praxis Impairment Details: Motor planning Cognition Overall Cognitive Status: Impaired/Different from baseline Arousal/Alertness: Awake/alert Orientation Level: Person;Place;Situation Person: Oriented Place: Oriented Situation: Oriented Year: 2021 Month: May Day of Week: Correct Memory: Impaired Memory Impairment: Retrieval deficit;Decreased recall of new information Immediate Memory Recall: Sock;Blue;Bed Memory Recall Sock: Not able to recall Memory Recall Blue: Without Cue Memory Recall Bed: With Cue Attention: Selective Sensation Sensation Light Touch: Appears Intact Hot/Cold: Appears Intact Coordination Gross Motor Movements are Fluid and Coordinated: Yes Fine Motor Movements are Fluid and Coordinated: No Finger Nose Finger Test: slow Motor  Motor Motor: Hemiplegia Motor - Skilled Clinical Observations: mild L Mobility  Transfers Sit to Stand: Minimal Assistance - Patient > 75% Stand to Sit: Minimal Assistance - Patient > 75%  Trunk/Postural Assessment  Cervical Assessment Cervical Assessment: Exceptions to WFL(head forward) Thoracic Assessment Thoracic Assessment: Exceptions to WFL(rounded shoulders) Lumbar Assessment Lumbar Assessment: Exceptions to WFL(posterior pelvic tilt) Postural Control Postural Control: Deficits on evaluation(delayed)  Balance Balance Balance Assessed: Yes Dynamic Sitting Balance Dynamic Sitting - Level of Assistance: 5: Stand by assistance Dynamic Standing Balance Dynamic Standing - Level of Assistance: 4: Min assist Extremity/Trunk Assessment RUE Assessment RUE Assessment: Within Functional Limits LUE Assessment LUE Assessment: Exceptions to [facility] General Strength Comments: generalized weakness anddiscoordiantion LUE Body  System: Neuro Brunstrum levels for arm and hand: Hand;Arm Brunstrum level for arm: Stage V Relative Independence from Synergy Brunstrum level for hand: Stage VI Isolated joint movements     Refer to Care Plan for Long Term Goals  Recommendations for other services: Therapeutic Recreation  Pet therapy and Kitchen group   Discharge Criteria: Patient will be discharged from OT if patient refuses treatment 3 consecutive times without medical reason, if treatment goals not met, if there is a change in medical status, if patient makes no progress towards goals or if patient is discharged from hospital.  The above assessment, treatment plan, treatment alternatives and goals were discussed and mutually agreed upon: by patient  Tonny Branch 12/30/2019, 7:26 AM

## 2019-12-30 NOTE — Progress Notes (Signed)
[place] PHYSICAL MEDICINE & REHABILITATION PROGRESS NOTE   Subjective/Complaints:  No issues overnite   ROS- no CP, SOB, N/V/D  Objective:   No results found. Recent Labs    12/29/19 1653  WBC 6.1  HGB 13.4  HCT 42.7  PLT 242   Recent Labs    12/29/19 1653  CREATININE 1.49*   No intake or output data in the 24 hours ending 12/30/19 0854   Physical Exam: Vital Signs Blood pressure (!) 153/87, pulse 69, temperature 97.9 F (36.6 C), resp. rate 19, height 6\' 2"  (1.88 m), weight 75.2 kg, SpO2 98 %.   General: No acute distress Mood and affect are appropriate Heart: Regular rate and rhythm no rubs murmurs or extra sounds Lungs: Clear to auscultation, breathing unlabored, no rales or wheezes Abdomen: Positive bowel sounds, soft nontender to palpation, nondistended Extremities: No clubbing, cyanosis, or edema Skin: No evidence of breakdown, no evidence of rash Neurologic:  motor strength is 5/5 in bilateral deltoid, bicep, tricep, grip, hip flexor, knee extensors, ankle dorsiflexor and plantar flexor Sensory exam normal sensation to light touch and proprioception in bilateral upper and lower extremities Cerebellar exam normal finger to nose to finger as well as heel to shin in bilateral upper and lower extremities Musculoskeletal: Full range of motion in all 4 extremities. No joint swelling   Assessment/Plan: 1. 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  Physiatrist is providing close team supervision and 24 hour management of active medical problems listed below.  Physiatrist and rehab team continue to assess barriers to discharge/monitor patient progress toward functional and medical goals  Care Tool:  Bathing              Bathing assist       Upper Body Dressing/Undressing Upper body dressing   What is the patient  wearing?: Hospital gown only    Upper body assist      Lower Body Dressing/Undressing Lower body dressing      What is the patient wearing?: Hospital gown only     Lower body assist       Toileting Toileting    Toileting assist Assist for toileting: Contact Guard/Touching assist     Transfers Chair/bed transfer  Transfers assist           Locomotion Ambulation   Ambulation assist              Walk 10 feet activity   Assist           Walk 50 feet activity   Assist           Walk 150 feet activity   Assist           Walk 10 feet on uneven surface  activity   Assist           Wheelchair     Assist               Wheelchair 50 feet with 2 turns activity    Assist            Wheelchair 150 feet activity     Assist          Blood pressure (!) 153/87, pulse 69, temperature 97.9 F (36.6 C), resp. rate 19, height 6\' 2"  (1.88 m), weight 75.2 kg, SpO2 98 %.    Medical Problem List and Plan:  1. Slurred speech  and altered mental status with left-sided weakness secondary to right superior cerebellar infarct secondary to large vessel disease  -patient may shower  -ELOS/Goals: 4-7 days/Mod I/Supervision  Admit to CIR  2. Antithrombotics:  -DVT/anticoagulation: Lovenox  -antiplatelet therapy: Aspirin Plavix x3 months then aspirin alone  3. Pain Management: Tylenol as needed  Monitor for headaches.  4. Mood: Provide emotional support  -antipsychotic agents: Seroquel 12.5 mg twice daily  5. Neuropsych: This patient is ?fully capable of making decisions on his own behalf.  6. Skin/Wound Care: Routine skin checks  7. Fluids/Electrolytes/Nutrition: Routine in and outs. CMP ordered.  8. Hypertension. Hygroton 25 mg daily, Norvasc 10 mg daily lisinopril 10 mg daily.  Monitor with increased mobility.  Vitals:   12/29/19 2005 12/30/19 0339  BP: (!) 149/73 (!) 153/87  Pulse: 65 69  Resp: 18 19  Temp:  97.7 F (36.5 C) 97.9 F (36.6 C)  SpO2: 97% 98%   9. Hyperlipidemia. Lipitor      LOS: 1 days A FACE TO FACE EVALUATION WAS PERFORMED  Erick Colace 12/30/2019, 8:54 AM

## 2019-12-31 NOTE — Plan of Care (Signed)
  Problem: RH SAFETY Goal: RH STG ADHERE TO SAFETY PRECAUTIONS W/ASSISTANCE/DEVICE Description: STG Adhere to Safety Precautions With min assist  Outcome: Progressing Goal: RH STG DECREASED RISK OF FALL WITH ASSISTANCE Description: STG Decreased Risk of Fall With mod I assist   Outcome: Progressing   Problem: RH COGNITION-NURSING Goal: RH STG USES MEMORY AIDS/STRATEGIES W/ASSIST TO PROBLEM SOLVE Description: STG Uses Memory Aids/Strategies With min assist to Problem Solve. Outcome: Progressing Goal: RH STG ANTICIPATES NEEDS/CALLS FOR ASSIST W/ASSIST/CUES Description: STG Anticipates Needs/Calls for Assist With min assist Outcome: Progressing   Problem: RH KNOWLEDGE DEFICIT Goal: RH STG INCREASE KNOWLEDGE OF DIABETES Description: Patient will be able to identify 2 signs/symptoms of hypoglycemia and hyperglycemia. Patient will be able to verbalize 2 interventions in addition to medication for diabetes control.  Outcome: Progressing Goal: RH STG INCREASE KNOWLEDGE OF HYPERTENSION Description: Patient will be able to identify what treatment medications patient is currently on and explain correlation between HTN and stroke risk Outcome: Progressing Goal: RH STG INCREASE KNOWLEGDE OF HYPERLIPIDEMIA Description: Patient will be able to verbalize what medication he is on for HLD control Outcome: Progressing Goal: RH STG INCREASE KNOWLEDGE OF STROKE PROPHYLAXIS Description: Patient will be able to explain their own stroke risk factors and identify what medications they are on for stroke prophylaxis  Outcome: Progressing

## 2019-12-31 NOTE — Plan of Care (Signed)
  Problem: RH SAFETY Goal: RH STG ADHERE TO SAFETY PRECAUTIONS W/ASSISTANCE/DEVICE Description: STG Adhere to Safety Precautions With min assist  Outcome: Progressing Goal: RH STG DECREASED RISK OF FALL WITH ASSISTANCE Description: STG Decreased Risk of Fall With mod I assist   Outcome: Progressing   Problem: RH COGNITION-NURSING Goal: RH STG USES MEMORY AIDS/STRATEGIES W/ASSIST TO PROBLEM SOLVE Description: STG Uses Memory Aids/Strategies With min assist to Problem Solve. Outcome: Progressing Goal: RH STG ANTICIPATES NEEDS/CALLS FOR ASSIST W/ASSIST/CUES Description: STG Anticipates Needs/Calls for Assist With min assist Outcome: Progressing   Problem: RH KNOWLEDGE DEFICIT Goal: RH STG INCREASE KNOWLEDGE OF DIABETES Description: Patient will be able to identify 2 signs/symptoms of hypoglycemia and hyperglycemia. Patient will be able to verbalize 2 interventions in addition to medication for diabetes control.  Outcome: Progressing Goal: RH STG INCREASE KNOWLEDGE OF HYPERTENSION Description: Patient will be able to identify what treatment medications patient is currently on and explain correlation between HTN and stroke risk Outcome: Progressing Goal: RH STG INCREASE KNOWLEGDE OF HYPERLIPIDEMIA Description: Patient will be able to verbalize what medication he is on for HLD control Outcome: Progressing Goal: RH STG INCREASE KNOWLEDGE OF STROKE PROPHYLAXIS Description: Patient will be able to explain their own stroke risk factors and identify what medications they are on for stroke prophylaxis  Outcome: Progressing   

## 2020-01-01 ENCOUNTER — Inpatient Hospital Stay (HOSPITAL_COMMUNITY): Payer: Medicare Other | Admitting: Occupational Therapy

## 2020-01-01 ENCOUNTER — Inpatient Hospital Stay (HOSPITAL_COMMUNITY): Payer: Medicare Other

## 2020-01-01 ENCOUNTER — Inpatient Hospital Stay (HOSPITAL_COMMUNITY): Payer: Medicare Other | Admitting: Physical Therapy

## 2020-01-01 LAB — COMPREHENSIVE METABOLIC PANEL
ALT: 18 U/L (ref 0–44)
AST: 22 U/L (ref 15–41)
Albumin: 3.3 g/dL — ABNORMAL LOW (ref 3.5–5.0)
Alkaline Phosphatase: 62 U/L (ref 38–126)
Anion gap: 8 (ref 5–15)
BUN: 31 mg/dL — ABNORMAL HIGH (ref 8–23)
CO2: 29 mmol/L (ref 22–32)
Calcium: 9.1 mg/dL (ref 8.9–10.3)
Chloride: 100 mmol/L (ref 98–111)
Creatinine, Ser: 1.41 mg/dL — ABNORMAL HIGH (ref 0.61–1.24)
GFR calc Af Amer: 59 mL/min — ABNORMAL LOW (ref >60)
GFR calc non Af Amer: 51 mL/min — ABNORMAL LOW (ref >60)
Glucose, Bld: 105 mg/dL — ABNORMAL HIGH (ref 70–99)
Potassium: 4.4 mmol/L (ref 3.5–5.1)
Sodium: 137 mmol/L (ref 135–145)
Total Bilirubin: 0.9 mg/dL (ref 0.3–1.2)
Total Protein: 7.2 g/dL (ref 6.5–8.1)

## 2020-01-01 LAB — CBC WITH DIFFERENTIAL/PLATELET
Abs Immature Granulocytes: 0.02 K/uL (ref 0.00–0.07)
Basophils Absolute: 0 K/uL (ref 0.0–0.1)
Basophils Relative: 1 %
Eosinophils Absolute: 0.1 K/uL (ref 0.0–0.5)
Eosinophils Relative: 3 %
HCT: 40.4 % (ref 39.0–52.0)
Hemoglobin: 12.7 g/dL — ABNORMAL LOW (ref 13.0–17.0)
Immature Granulocytes: 0 %
Lymphocytes Relative: 44 %
Lymphs Abs: 2.3 K/uL (ref 0.7–4.0)
MCH: 25.9 pg — ABNORMAL LOW (ref 26.0–34.0)
MCHC: 31.4 g/dL (ref 30.0–36.0)
MCV: 82.4 fL (ref 80.0–100.0)
Monocytes Absolute: 0.5 K/uL (ref 0.1–1.0)
Monocytes Relative: 10 %
Neutro Abs: 2.2 K/uL (ref 1.7–7.7)
Neutrophils Relative %: 42 %
Platelets: 237 K/uL (ref 150–400)
RBC: 4.9 MIL/uL (ref 4.22–5.81)
RDW: 15.5 % (ref 11.5–15.5)
WBC: 5.2 K/uL (ref 4.0–10.5)
nRBC: 0 % (ref 0.0–0.2)

## 2020-01-01 MED ORDER — LISINOPRIL 5 MG PO TABS
5.0000 mg | ORAL_TABLET | Freq: Every day | ORAL | Status: DC
  Administered 2020-01-02 – 2020-01-09 (×8): 5 mg via ORAL
  Filled 2020-01-01 (×8): qty 1

## 2020-01-01 MED ORDER — METOPROLOL TARTRATE 12.5 MG HALF TABLET: 12.5000 mg | ORAL_TABLET | Freq: Two times a day (BID) | ORAL | Status: DC

## 2020-01-01 NOTE — Progress Notes (Addendum)
[place] PHYSICAL MEDICINE & REHABILITATION PROGRESS NOTE   Subjective/Complaints:  Pt reports sleeping OK- LBM last night- breakfast doesn't taste great, but trying to eat- ate >75% so far.   No pain; denies any particular issues   ROS-  Pt denies SOB, abd pain, CP, N/V/C/D, and vision changes   Objective:   No results found. Recent Labs    12/29/19 1653 01/01/20 0557  WBC 6.1 5.2  HGB 13.4 12.7*  HCT 42.7 40.4  PLT 242 237   Recent Labs    12/29/19 1653 01/01/20 0557  NA  --  137  K  --  4.4  CL  --  100  CO2  --  29  GLUCOSE  --  105*  BUN  --  31*  CREATININE 1.49* 1.41*  CALCIUM  --  9.1    Intake/Output Summary (Last 24 hours) at 01/01/2020 1034 Last data filed at 01/01/2020 0700 Gross per 24 hour  Intake 770 ml  Output --  Net 770 ml     Physical Exam: Vital Signs Blood pressure (!) 145/80, pulse (!) 53, temperature 97.7 F (36.5 C), resp. rate 17, height 6\' 2"  (1.88 m), weight 75.2 kg, SpO2 100 %.   General: Awake, alert, sitting EOB eating breakfast, NAD Mood and affect are appropriate Heart: bradycardic, regular rhythm Lungs: CTA b/l Abdomen: soft, NT, ND, (+)BS Extremities: No clubbing, cyanosis, or edema Skin: No evidence of breakdown, no evidence of rash Neurologic:  motor strength is 5/5 in bilateral deltoid, bicep, tricep, grip, hip flexor, knee extensors, ankle dorsiflexor and plantar flexor Sensory exam normal sensation to light touch and proprioception in bilateral upper and lower extremities Cerebellar exam normal finger to nose to finger as well as heel to shin in bilateral upper and lower extremities Musculoskeletal: Full range of motion in all 4 extremities. No joint swelling   Assessment/Plan: 1. 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  Physiatrist is providing close team supervision and  24 hour management of active medical problems listed below.  Physiatrist and rehab team continue to assess barriers to discharge/monitor patient progress toward functional and medical goals  Care Tool:  Bathing    Body parts bathed by patient: Right arm, Left arm, Chest, Abdomen, Front perineal area, Buttocks, Right upper leg, Left upper leg, Right lower leg, Left lower leg, Face         Bathing assist Assist Level: Minimal Assistance - Patient > 75%     Upper Body Dressing/Undressing Upper body dressing   What is the patient wearing?: Pull over shirt    Upper body assist Assist Level: Supervision/Verbal cueing    Lower Body Dressing/Undressing Lower body dressing      What is the patient wearing?: Pants, Underwear/pull up     Lower body assist Assist for lower body dressing: Minimal Assistance - Patient > 75%     Toileting Toileting    Toileting assist Assist for toileting: Independent with assistive device Assistive Device Comment: cane   Transfers Chair/bed transfer  Transfers assist     Chair/bed transfer assist level: Supervision/Verbal cueing     Locomotion Ambulation   Ambulation assist      Assist level: Supervision/Verbal cueing Assistive device: Walker-rolling Max distance: 150   Walk 10 feet activity   Assist     Assist level: Supervision/Verbal cueing Assistive device: Walker-rolling   Walk 50 feet activity  Assist    Assist level: Supervision/Verbal cueing Assistive device: Walker-rolling    Walk 150 feet activity   Assist Walk 150 feet activity did not occur: Safety/medical concerns(fatigue, LE weakness, decreased postural control)  Assist level: Supervision/Verbal cueing Assistive device: Walker-rolling    Walk 10 feet on uneven surface  activity   Assist Walk 10 feet on uneven surfaces activity did not occur: Safety/medical concerns(fatigue, LE weakness, decreased postural control)          Wheelchair     Assist Will patient use wheelchair at discharge?: No Type of Wheelchair: Manual    Wheelchair assist level: Supervision/Verbal cueing Max wheelchair distance: 35ft    Wheelchair 50 feet with 2 turns activity    Assist        Assist Level: Supervision/Verbal cueing   Wheelchair 150 feet activity     Assist      Assist Level: Maximal Assistance - Patient 25 - 49%   Blood pressure (!) 145/80, pulse (!) 53, temperature 97.7 F (36.5 C), resp. rate 17, height 6\' 2"  (1.88 m), weight 75.2 kg, SpO2 100 %.    Medical Problem List and Plan:  1. 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 Antithrombotics:  -DVT/anticoagulation: Lovenox  -antiplatelet therapy: Aspirin Plavix x3 months then aspirin alone  3. Pain Management: Tylenol as needed  Monitor for headaches.  4. Mood: Provide emotional support  -antipsychotic agents: Seroquel 12.5 mg twice daily  5. Neuropsych: This patient is ?fully capable of making decisions on his own behalf.  6. Skin/Wound Care: Routine skin checks  7. Fluids/Electrolytes/Nutrition: Routine in and outs. CMP ordered.  8. Hypertension. Hygroton 25 mg daily, Norvasc 10 mg daily lisinopril 10 mg daily.  Monitor with increased mobility.  Vitals:   12/31/19 1935 01/01/20 0542  BP: 140/77 (!) 145/80  Pulse: 72 (!) 53  Resp:    Temp: 97.7 F (36.5 C) 97.7 F (36.5 C)  SpO2: 98% 100%   5/31- change BP meds to Lisinopril 5 mg due to elevated Bun and Cr- came into hospital (and from 2016) had Cr of <1 and BUN <10- now Cr 1.41 and BUN 31- can't add B blocker due to low pulse- was 53 today- can't do HCTZ because already on diruretics; don't want to do Clonidine since noncompliant.  9. Hyperlipidemia. Lipitor  10. Acute renal impairment  5/31-  will decrease Lisinopril as above- and place labs for later in week.      LOS: 3 days A FACE TO FACE EVALUATION WAS PERFORMED  Filbert Craze 01/01/2020, 10:34 AM

## 2020-01-01 NOTE — IPOC Note (Addendum)
Overall Plan of Care [facility]) Patient Details Name: Brandon Perkins MRN: [id number] DOB: February 28, 1951  Admitting Diagnosis: Cerebral infarction involving right cerebellar artery [facility])  Hospital Problems: Principal Problem:   Cerebral infarction involving right cerebellar artery ([place])     Functional Problem List: Nursing Motor  PT Balance, Endurance, Motor  OT Balance, Cognition, Endurance, Motor, Safety  SLP    TR         Basic ADL's: OT Grooming, Bathing, Dressing, Toileting     Advanced  ADL's: OT       Transfers: PT Bed Mobility, Bed to Chair, Car, Manufacturing systems engineer, Metallurgist: PT Ambulation, Emergency planning/management officer, Stairs     Additional Impairments: OT    SLP Communication, Social Cognition expression Awareness, Attention, Memory, Problem Solving  TR      Anticipated Outcomes Item Anticipated Outcome  Self Feeding S  Swallowing      Basic self-care  S  Toileting  S   Bathroom Transfers S  Bowel/Bladder  Patient will remain continent  Transfers  Mod I with LRAD  Locomotion  Supervision with LRAD  Communication  Min A  Cognition  Min A  Pain  patient will remain pain free  Safety/Judgment  patient will participate in activities safely   Therapy Plan: PT Intensity: Minimum of 1-2 x/day ,45 to 90 minutes PT Frequency: 5 out of 7 days PT Duration Estimated Length of Stay: 7-10 days OT Intensity: Minimum of 1-2 x/day, 45 to 90 minutes OT Frequency: 5 out of 7 days OT Duration/Estimated Length of Stay: 7-10 SLP Intensity: Minumum of 1-2 x/day, 30 to 90 minutes SLP Frequency: 3 to 5 out of 7 days SLP Duration/Estimated Length of Stay: 7-10 days   Due to the current state of emergency, patients may not be receiving their 3-hours of Medicare-mandated therapy.   Team Interventions: Nursing Interventions Psychosocial Support, Discharge Planning, Cognitive Remediation/Compensation  PT interventions Ambulation/gait training, Discharge  planning, Functional mobility training, Psychosocial support, Therapeutic Activities, Balance/vestibular training, Disease management/prevention, Neuromuscular re-education, Skin care/wound management, Therapeutic Exercise, Wheelchair propulsion/positioning, Cognitive remediation/compensation, DME/adaptive equipment instruction, Pain management, Splinting/orthotics, UE/LE Strength taining/ROM, Community reintegration, Technical sales engineer stimulation, Patient/family education, IT trainer, UE/LE Coordination activities  OT Interventions Training and development officer, Discharge planning, Pain management, Self Care/advanced ADL retraining, Therapeutic Activities, UE/LE Coordination activities, Visual/perceptual remediation/compensation, Therapeutic Exercise, Skin care/wound managment, Patient/family education, Functional mobility training, Disease mangement/prevention, Cognitive remediation/compensation, Community reintegration, Engineer, drilling, Neuromuscular re-education, Psychosocial support, UE/LE Strength taining/ROM  SLP Interventions Cognitive remediation/compensation, Cueing hierarchy, Functional tasks, Internal/external aids, Speech/Language facilitation  TR Interventions    SW/CM Interventions     Barriers to Discharge MD  Medical stability, Home enviroment access/loayout, Lack of/limited family support and Medication compliance  Nursing      PT Lack of/limited family support, Decreased caregiver support family is only able to provide intermittent assist  OT Decreased caregiver support, Lack of/limited family support likely will require 24/7 S  SLP Decreased caregiver support    SW       Team Discharge Planning: Destination: PT-Home ,OT- Home , SLP-Home Projected Follow-up: PT-Home health PT, OT-  Home health OT, SLP-Home Health SLP, 24 hour supervision/assistance Projected Equipment Needs: PT-To be determined, OT- Tub/shower seat, To be determined, 3 in 1 bedside  comode, SLP-None recommended by SLP Equipment Details: PT-has SPC, OT-  Patient/family involved in discharge planning: PT- Patient,  OT-Patient, SLP-Patient  MD ELOS: 7-10 days Medical Rehab Prognosis:  Good Assessment: Pt is a superior cerebellar  infarct With balance issues and uncontrolled HTN- and elevated BUN and Cr whicyh is new- need to treat aggressively.   Goals supervision to min assist     See Team Conference Notes for weekly updates to the plan of care

## 2020-01-01 NOTE — Progress Notes (Signed)
Occupational Therapy Session Note  Patient Details  Name: Brandon Perkins MRN: [id number] Date of Birth: 03-07-1951  Today's Date: 01/01/2020 OT Individual Time: 2099-0689 OT Individual Time Calculation (min): 43 min    Short Term Goals: Week 1:  OT Short Term Goal 1 (Week 1): STG=LTG d/t ELOS  Skilled Therapeutic Interventions/Progress Updates:    Pt greeted in his recliner with no c/o pain. Requesting to use the bathroom. Ambulatory transfer to toilet completed without AD and Min A, pt voiding bladder in standing. Ambulation to the sink completed with Min A as well to wash hands, vcs for decreasing reliance on furniture for stability. After he returned to the recliner, OT provided him with 2 Lt UE HEPs for the room. Worked on palm<finger translations when using foam cubes and general strengthening when using both hands to create shapes using tan theraputty. Pt remained in the recliner with all needs within reach and chair alarm set.    Therapy Documentation Precautions:  Precautions Precautions: Fall Restrictions Weight Bearing Restrictions: No ADL:        Therapy/Group: Individual Therapy  Brandon Perkins A Brandon Perkins 01/01/2020, 2:25 PM

## 2020-01-01 NOTE — Progress Notes (Signed)
Physical Therapy Session Note  Patient Details  Name: Brandon Perkins MRN: [id number] Date of Birth: [birth year]  Today's Date: 01/01/2020 PT Individual Time: 0904-1000 PT Individual Time Calculation (min): 56 min   Short Term Goals: Week 1:  PT Short Term Goal 1 (Week 1): STG=LTG due to LOS  Skilled Therapeutic Interventions/Progress Updates:   Pt received sitting in WC and agreeable to PT. Pt performed lower body dressing with supervision assist for safety. Sit<>stand from recliner height with min assist for anterior weight shift. Pt able to don shoes with increased time and set up assist.   Gait training with RW and supervision assist 2x 170f to and from orthogym +150t. Min cues for posture and safety in turns. Dynamic gait training also performed forward/backward 3 x 123fwith supervision assist and min cues for increased step length and symmetry. Throughout treatment pt performed sit<>stand from EOM with supervision assist, min cues to prevent locking LE against bed.  PT instructyed p tin supine NMR: SAQ, Bridge, SLR, hip abduction, reciprocal march, and ankle DF. Each completed x 10 BLE with cues for full ROM and proper speed to maximize neuromotor learning.    Dynavision dynamic balance training program A, 1 min while standing on airex pad. performed with supervision assist with RUE then LUE; min cues for visual scanning. No LOB noted    Patient returned to room and performed stand pivot to recliner with RW and supervision assist. Pt left sitting in recliner with call bell in reach and all needs met.        Therapy Documentation Precautions:  Precautions Precautions: Fall Restrictions Weight Bearing Restrictions: No   Pain: Pain Assessment Pain Scale: 0-10 Pain Score: 0-No pain    Therapy/Group: Individual Therapy  AuLorie Phenix/31/2021, 10:00 AM

## 2020-01-01 NOTE — Plan of Care (Signed)
  Problem: RH SAFETY Goal: RH STG ADHERE TO SAFETY PRECAUTIONS W/ASSISTANCE/DEVICE Description: STG Adhere to Safety Precautions With min assist  Outcome: Progressing Goal: RH STG DECREASED RISK OF FALL WITH ASSISTANCE Description: STG Decreased Risk of Fall With mod I assist   Outcome: Progressing   Problem: RH COGNITION-NURSING Goal: RH STG USES MEMORY AIDS/STRATEGIES W/ASSIST TO PROBLEM SOLVE Description: STG Uses Memory Aids/Strategies With min assist to Problem Solve. Outcome: Progressing Goal: RH STG ANTICIPATES NEEDS/CALLS FOR ASSIST W/ASSIST/CUES Description: STG Anticipates Needs/Calls for Assist With min assist Outcome: Progressing   Problem: RH KNOWLEDGE DEFICIT Goal: RH STG INCREASE KNOWLEDGE OF DIABETES Description: Patient will be able to identify 2 signs/symptoms of hypoglycemia and hyperglycemia. Patient will be able to verbalize 2 interventions in addition to medication for diabetes control.  Outcome: Progressing Goal: RH STG INCREASE KNOWLEDGE OF HYPERTENSION Description: Patient will be able to identify what treatment medications patient is currently on and explain correlation between HTN and stroke risk Outcome: Progressing Goal: RH STG INCREASE KNOWLEGDE OF HYPERLIPIDEMIA Description: Patient will be able to verbalize what medication he is on for HLD control Outcome: Progressing Goal: RH STG INCREASE KNOWLEDGE OF STROKE PROPHYLAXIS Description: Patient will be able to explain their own stroke risk factors and identify what medications they are on for stroke prophylaxis  Outcome: Progressing   

## 2020-01-01 NOTE — Progress Notes (Signed)
Occupational Therapy Session Note  Patient Details  Name: Brandon Perkins MRN: [id number] Date of Birth: [birth year]  Today's Date: 01/01/2020 OT Individual Time: 1002-1058 OT Individual Time Calculation (min): 56 min   Short Term Goals: Week 1:  OT Short Term Goal 1 (Week 1): STG=LTG d/t ELOS  Skilled Therapeutic Interventions/Progress Updates:    Pt greeted at time of session seated up in recliner, agreeable to OT session to work on bathing and dressing. Pt ambulated recliner <> bathroom with RW with CGA, able to stand at toilet to urinate with instructions to bring RW over toilet for safety all with supervision, ambulatory transfer to shower bench with CGA and participated in UB/LB bathing in standing position with CGA throughout. Pt able to perform dynamic bathing tasks in standing with unilateral support but required CGA-close supervision for safety. Able to briefly stand without support to perform bathing tasks. LB dressing tasks for underwear, pants, and socks with CGA for all except min A to don/doff shoes and socks with pt able to perform most of the task, able to static stand without support to don pants/underwear over hips and put on his belt. UB dressing with supervision. Pt requires verbal/tactile cues for safety and proper use of RW throughout session and extended time to complete tasks. Noted some resistance to education today as well. Pt did state he lives alone, has a tub shower combo. Pt in recliner with alarm on, call bell in reach, all needs met.   IV covered during shower  Therapy Documentation Precautions:  Precautions Precautions: Fall Restrictions Weight Bearing Restrictions: No      Therapy/Group: Individual Therapy  Cosmo Tetreault A Byrd Rushlow 01/01/2020, 12:51 PM

## 2020-01-01 NOTE — Progress Notes (Signed)
Speech Language Pathology Daily Session Note  Patient Details  Name: Brandon Perkins MRN: [id number] Date of Birth: Jun 10, 1951  Today's Date: 01/01/2020 SLP Individual Time: 1400-1459 SLP Individual Time Calculation (min): 59 min  Short Term Goals: Week 1: SLP Short Term Goal 1 (Week 1): STG=LTG due to short ELOS  Skilled Therapeutic Interventions: Skilled ST services focused on cognitive skills.  SLP educated pt on speech intelligibility strategies, handout given, focusing on pausing between words, increasing vocal intensity and over articulation. Pt demonstrated increase awareness of speech deficits, expressing after talking to a firend "I agree my speech is different, but it was not 100% before." Pt indicated baseline deficits of imprecise articulation. Pt demonstrated 70% intelligibility at phrase level with max A verbal cues to utilize strategies. SLP facilitated basic problem solving, sustained attention and recall skills in ALFA money management task, pt required max A fade to mod A verbal cues (3/4th way through task.) SLP create, write in and educated pt on memory notebook to aid in daily recall. SLP set up suction, per pt request as pt continued to demonstrate pooling of secretions in oral cavity. Pt was left in room with call bell within reach and chair alarm set. ST recommends to continue skilled ST services.      Pain Pain Assessment Pain Score: 0-No pain  Therapy/Group: Individual Therapy  Kherington Meraz  [facility] 01/01/2020, 3:52 PM

## 2020-01-02 ENCOUNTER — Inpatient Hospital Stay (HOSPITAL_COMMUNITY): Payer: Medicare Other

## 2020-01-02 ENCOUNTER — Inpatient Hospital Stay (HOSPITAL_COMMUNITY): Payer: Medicare Other | Admitting: Physical Therapy

## 2020-01-02 ENCOUNTER — Inpatient Hospital Stay (HOSPITAL_COMMUNITY): Payer: Medicare Other | Admitting: Occupational Therapy

## 2020-01-02 MED ORDER — BLOOD PRESSURE CONTROL BOOK
Freq: Once | Status: AC
  Filled 2020-01-02: qty 1

## 2020-01-02 NOTE — Progress Notes (Signed)
[place] PHYSICAL MEDICINE & REHABILITATION PROGRESS NOTE   Subjective/Complaints:  No issues overnite except HA /neck pain, some improvement after tylenol  ROS-  Pt denies SOB, abd pain, CP, N/V/C/D, and vision changes   Objective:   No results found. Recent Labs    01/01/20 0557  WBC 5.2  HGB 12.7*  HCT 40.4  PLT 237   Recent Labs    01/01/20 0557  NA 137  K 4.4  CL 100  CO2 29  GLUCOSE 105*  BUN 31*  CREATININE 1.41*  CALCIUM 9.1    Intake/Output Summary (Last 24 hours) at 01/02/2020 0720 Last data filed at 01/01/2020 1700 Gross per 24 hour  Intake 422 ml  Output --  Net 422 ml     Physical Exam: Vital Signs Blood pressure 117/68, pulse 68, temperature 97.9 F (36.6 C), temperature source Oral, resp. rate 16, height 6\' 2"  (1.88 m), weight 75.2 kg, SpO2 99 %.  General: No acute distress Mood and affect are appropriate Heart: Regular rate and rhythm no rubs murmurs or extra sounds Lungs: Clear to auscultation, breathing unlabored, no rales or wheezes Abdomen: Positive bowel sounds, soft nontender to palpation, nondistended Extremities: No clubbing, cyanosis, or edema Skin: No evidence of breakdown, no evidence of rash Neurologic: Cranial nerves II through XII intact, motor strength is 5/5 in bilateral deltoid, bicep, tricep, grip, hip flexor, knee extensors, ankle dorsiflexor and plantar flexor Sensory exam normal sensation to light touch and proprioception in bilateral upper and lower extremities Cerebellar exam normal finger to nose to finger as well as heel to shin in bilateral upper and lower extremities Musculoskeletal: Full range of motion in all 4 extremities. No joint swelling    Assessment/Plan: 1. 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  Physiatrist is providing close team supervision and 24 hour  management of active medical problems listed below.  Physiatrist and rehab team continue to assess barriers to discharge/monitor patient progress toward functional and medical goals  Care Tool:  Bathing    Body parts bathed by patient: Right arm, Left arm, Chest, Abdomen, Front perineal area, Buttocks, Right upper leg, Left upper leg, Right lower leg, Left lower leg, Face         Bathing assist Assist Level: Contact Guard/Touching assist     Upper Body Dressing/Undressing Upper body dressing   What is the patient wearing?: Pull over shirt    Upper body assist Assist Level: Supervision/Verbal cueing    Lower Body Dressing/Undressing Lower body dressing      What is the patient wearing?: Pants, Underwear/pull up     Lower body assist Assist for lower body dressing: Minimal Assistance - Patient > 75%     Toileting Toileting    Toileting assist Assist for toileting: Contact Guard/Touching assist Assistive Device Comment: cane   Transfers Chair/bed transfer  Transfers assist     Chair/bed transfer assist level: Supervision/Verbal cueing     Locomotion Ambulation   Ambulation assist      Assist level: Supervision/Verbal cueing Assistive device: Walker-rolling Max distance: 150   Walk 10 feet activity   Assist     Assist level: Supervision/Verbal cueing Assistive device: Walker-rolling   Walk 50 feet activity   Assist    Assist level: Supervision/Verbal cueing Assistive device: Walker-rolling    Walk 150 feet activity   Assist Walk 150 feet activity did not occur: Safety/medical concerns(fatigue,  LE weakness, decreased postural control)  Assist level: Supervision/Verbal cueing Assistive device: Walker-rolling    Walk 10 feet on uneven surface  activity   Assist Walk 10 feet on uneven surfaces activity did not occur: Safety/medical concerns(fatigue, LE weakness, decreased postural control)         Wheelchair     Assist Will  patient use wheelchair at discharge?: No Type of Wheelchair: Manual    Wheelchair assist level: Supervision/Verbal cueing Max wheelchair distance: 92ft    Wheelchair 50 feet with 2 turns activity    Assist        Assist Level: Supervision/Verbal cueing   Wheelchair 150 feet activity     Assist      Assist Level: Maximal Assistance - Patient 25 - 49%   Blood pressure 117/68, pulse 68, temperature 97.9 F (36.6 C), temperature source Oral, resp. rate 16, height 6\' 2"  (1.88 m), weight 75.2 kg, SpO2 99 %.    Medical Problem List and Plan:  1. 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 Antithrombotics:  -DVT/anticoagulation: Lovenox  -antiplatelet therapy: Aspirin Plavix x3 months then aspirin alone  3. Pain Management: Tylenol as needed  Monitor for headaches.  4. Mood: Provide emotional support  -antipsychotic agents: Seroquel 12.5 mg twice daily  5. Neuropsych: This patient is ?fully capable of making decisions on his own behalf.  6. Skin/Wound Care: Routine skin checks  7. Fluids/Electrolytes/Nutrition: Routine in and outs. CMP ordered.  8. Hypertension. [place]gro[place] 25 mg daily, Norvasc 10 mg daily lisinopril 10 mg daily.  Monitor with increased mobility. - Improved systolic, 6/1 Vitals:   32/20/25 1955 01/02/20 0400  BP: (!) 147/78 117/68  Pulse: 72 68  Resp:  16  Temp: 97.7 F (36.5 C) 97.9 F (36.6 C)  SpO2: 100% 99%   5/31- change BP meds to Lisinopril 5 mg due to elevated Bun and Cr- came into hospital (and from 2016) had Cr of <1 and BUN <10- now Cr 1.41 and BUN 31- can't add B blocker due to low pulse- was 53 today- can't do HCTZ because already on diruretics; don't want to do Clonidine since noncompliant.  9. Hyperlipidemia. Lipitor  10. Acute renal  impairment  5/31- will decrease Lisinopril as above- and place labs for later in week.      LOS: 4 days A FACE TO FACE EVALUATION WAS PERFORMED  Charlett Blake 01/02/2020, 7:20 AM

## 2020-01-02 NOTE — Care Management (Signed)
Patient ID: Brandon Perkins, male   DOB: Dec 18, 1950, 69 y.o.   MRN: [id number]  Met with the patient to review care management role and educational needs for secondary stroke prevention. Reviewed HTN and HLD risks and secondary stroke risks. Patient reported he ate mostly fast food items or microwave meals PTA. Reported he was not for taking a lot of medications. Reviewed medications DAPT x 25mhs then change to ASA alone and HTN medications and HLD medication. Paitent given a handbook on HTN and DASH diet along with medications after a stroke to review for HTN control. Patient stated an understanding of information reviewed. Biggest concern at the present is his coughing after he eats. Reports doesn't feel like the food gets stuck or goes down wrong, however he notices the coughing with liquids and solids. [place] suction at the bedside.

## 2020-01-02 NOTE — Progress Notes (Signed)
Inpatient Rehabilitation Center Individual Statement of Services  Patient Name:  Brandon Perkins  Date:  01/02/2020  Welcome to the Inpatient Rehabilitation Center.  Our goal is to provide you with an individualized program based on your diagnosis and situation, designed to meet your specific needs.  With this comprehensive rehabilitation program, you will be expected to participate in at least 3 hours of rehabilitation therapies Monday-Friday, with modified therapy programming on the weekends.  Your rehabilitation program will include the following services:  Physical Therapy (PT), Occupational Therapy (OT), Speech Therapy (ST), 24 hour per day rehabilitation nursing, Therapeutic Recreaction (TR), Neuropsychology, Care Coordinator, Rehabilitation Medicine, Nutrition Services, Pharmacy Services and Other  Weekly team conferences will be held on Wednesdays to discuss your progress.  Your Inpatient Rehabilitation Care Coordinator will talk with you frequently to get your input and to update you on team discussions.  Team conferences with you and your family in attendance may also be held.  Expected length of stay: 4-6 Days  Overall anticipated outcome: MOD I to Supervision  Depending on your progress and recovery, your program may change. Your Inpatient Rehabilitation Care Coordinator will coordinate services and will keep you informed of any changes. Your Inpatient Rehabilitation Care Coordinator's name and contact numbers are listed  below.  The following services may also be recommended but are not provided by the Inpatient Rehabilitation Center:    Home Health Rehabiltiation Services  Outpatient Rehabilitation Services    Arrangements will be made to provide these services after discharge if needed.  Arrangements include referral to agencies that provide these services.  Your insurance has been verified to be:  Medicare Your primary doctor is:  NO PCP  Pertinent information will be shared  with your doctor and your insurance company.  Inpatient Rehabilitation Care Coordinator:  Lavera Guise, [place] [phone] or [phone]  Information discussed with and copy given to patient by: Andria Rhein, 01/02/2020, 10:07 AM

## 2020-01-02 NOTE — Progress Notes (Signed)
Physical Therapy Session Note  Patient Details  Name: Brandon Perkins MRN: [id number] Date of Birth: 06-02-1951  Today's Date: 01/02/2020 PT Individual Time: 0803-0900 and 1435-1505 PT Individual Time Calculation (min): 57 min   Short Term Goals: Week 1:  PT Short Term Goal 1 (Week 1): STG=LTG due to LOS  Skilled Therapeutic Interventions/Progress Updates: Pt presented sitting EOB agreeable to therapy. Pt donned pants with supervision EOB. Pt able to stand from EOB with supervision to complete pulling pants over hips. Pt donned socks and shoes with set up. Pt ambulated to rehab gym with CGA and RW with poor B foot clearance and forward flexed posture. Pt participated in STS 2 x 5 for BLE and static balance. Pt required cues to not brace BLE against mat however was able to perform all with CGA fading to close S. Pt also participated in toe taps initially with AD then progressed to no AD with CGA and increased time. Pt noted to place RLE in wide BOS when performing activity. Participated in ambulation weaving though cones both with and without AD. Pt noted to have poor management with RW when weaving through cones, pt performed same activity without AD with minimal difference in foot clearance. PTA obtained exercise band and wrapped to facilitate knee flexion during ambulation. Pt then ambulated back to room with improved L knee flexion and verbal cues for heel strike. Pt returned to room at end of session and sat in recliner. Pt left in chair with seat alarm on, call bell within reach and needs met.   Tx2: Pt presented in recliner agreeable to therapy. Performed ambulatory transfer to w/c with RW and close S. Pt then transported to day room for time management and energy conservation. Transferred to [employer] and participated in seated march at 70cm/sec x 10 as "warm up". Pt then performed STS x 5 with UE support with PTA providing education on decreased use of BUE to pull self up vs using BLE. Pt was  able to improve BLE recruitment however noted significant reliance on pulling self up. Pt then participated in standing march at 50cm/sec 2 bouts of 15 cycles for reciprocal activity as well as LE strengthening. Pt also required cues for push through full range. After brief rest pt ambulated back to room with RW with intermittent cues for increasing B feet clearance (L>R), and to improve posture vs reaching forward with RW. Once back in room pt returned to recliner and left with seat alarm on, call bell within reach and needs met.      Therapy Documentation Precautions:  Precautions Precautions: Fall Restrictions Weight Bearing Restrictions: No General:   Vital Signs: Therapy Vitals Temp: (!) 97.5 F (36.4 C) Pulse Rate: 61 Resp: 18 BP: 138/73 Patient Position (if appropriate): Sitting Oxygen Therapy SpO2: 100 % O2 Device: Room Air Pain: Pain Assessment Pain Score: 0-No pain   Therapy/Group: Individual Therapy  Shai Mckenzie  Kodee Ravert, PTA  01/02/2020, 3:53 PM

## 2020-01-02 NOTE — Progress Notes (Signed)
Inpatient Rehabilitation Care Coordinator Assessment and Plan  Patient Details  Name: Brandon Perkins MRN: [id number] Date of Birth: [birth year]  Today's Date: 01/02/2020  Problem List:  Patient Active Problem List   Diagnosis Date Noted  . 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 Benign essential HTN   . Dyslipidemia   . Dysarthria, post-stroke   . Cerebral thrombosis with cerebral infarction 12/25/2019  . Hypertensive emergency 12/24/2019  . Hypokalemia 11/07/2014  . Hypertensive urgency 11/07/2014  . Elevated blood pressure 11/06/2014  . Syncope, near 11/06/2014  . GI bleeding 11/06/2014  . Hand laceration 11/06/2014   Past Medical History:  Past Medical History:  Diagnosis Date  . Blood clot in vein    left arm vein blood clot, not know the detail, treated with ASA per pt  . Cataract    OU  . Hypertension   . Hypertensive retinopathy    OU   Past Surgical History: History reviewed. No pertinent surgical history. Social History:  reports that he has never smoked. He has never used smokeless tobacco. He reports that he does not drink alcohol or use drugs.  Family / Support Systems Marital Status: Single Children: 2 Daughters and 1 son Anticipated Caregiver: Children will provide care (Rotating Providing Care) Ability/Limitations of Caregiver: no. Caregiver Availability: 24/7  Social History Preferred language: English Religion: Baptist Read: Yes Write: Yes   Abuse/Neglect Abuse/Neglect Assessment Can Be Completed: Yes Physical Abuse: Denies Verbal Abuse: Denies Sexual Abuse: Denies Exploitation of patient/patient's resources: Denies Self-Neglect: Denies  Emotional Status Pt's affect, behavior and adjustment status: no Recent Psychosocial Issues: no Psychiatric History: no Substance Abuse History: no  Patient / Family Perceptions, Expectations & Goals Pt/Family understanding of illness & functional limitations:  yes Pt/family expectations/goals: Goal to return home  [employer]: None Premorbid Home Care/DME Agencies: None Transportation available at discharge: family able to transport  Discharge Planning Living Arrangements: Alone Support Systems: Children Type of Residence: (2-3 to enter front door.) Insurance Resources: Medicare Does the patient have any problems obtaining your medications?: No Care Coordinator Barriers to Discharge: Lack of/limited family support, Decreased caregiver support Care Coordinator Barriers to Discharge Comments: patient discharging home alone. Children will rotate providing supervision, 2-3 steps to enter home Care Coordinator Anticipated Follow Up Needs: HH/OP Expected length of stay: 4-6 Days  Clinical Impression Sw entered patients room, very pleasant. Introduced self, explained role and process. Sw contacted son and daughter to provide this information. Daughter works during the day, son works at night. Best to contact daughter per the brother. SW will follow up with questions and concerns. Andria Rhein 01/02/2020, 12:52 PM

## 2020-01-02 NOTE — Progress Notes (Signed)
Speech Language Pathology Daily Session Note  Patient Details  Name: Brandon Perkins MRN: [id number] Date of Birth: 1951-05-13  Today's Date: 01/02/2020 SLP Individual Time: 2217-9810 SLP Individual Time Calculation (min): 42 min  Short Term Goals: Week 1: SLP Short Term Goal 1 (Week 1): STG=LTG due to short ELOS  Skilled Therapeutic Interventions: Skilled ST services focused on cognitive skills. Pt demonstrated recall of 50% of today's therapy events, they were not recorded in memory notebook for pt to reference, but was able to recall 70% of events with min A verbal cues. SLP facilitated basic problem solving and recall in card sorting task by shape, pt demonstrated mod I and in novel card task Blink pt required max A fade to min A verbal cues for problem solving and mod A verbal cues to recall three ways to match. SLP provided education of speech intelligibility strategies, pt demonstrated 70% intelligibility during conversation exchange, however when pt was telling a story intelligibility dropped to 60% intelligibility. Pt was left in room with call bell within reach and chair alarm set. ST recommends to continue skilled ST services.      Pain Pain Assessment Pain Score: 0-No pain  Therapy/Group: Individual Therapy  Ayda Tancredi  [facility] 01/02/2020, 3:51 PM

## 2020-01-02 NOTE — Plan of Care (Signed)
  Problem: RH SAFETY Goal: RH STG ADHERE TO SAFETY PRECAUTIONS W/ASSISTANCE/DEVICE Description: STG Adhere to Safety Precautions With supervision assist  Outcome: Progressing Goal: RH STG DECREASED RISK OF FALL WITH ASSISTANCE Description: STG Decreased Risk of Fall With mod I assist   Outcome: Progressing   Problem: RH COGNITION-NURSING Goal: RH STG USES MEMORY AIDS/STRATEGIES W/ASSIST TO PROBLEM SOLVE Description: STG Uses Memory Aids/Strategies With cues/reminders assist to Problem Solve. Outcome: Progressing Goal: RH STG ANTICIPATES NEEDS/CALLS FOR ASSIST W/ASSIST/CUES Description: STG Anticipates Needs/Calls for Assist With cues/reminders assist Outcome: Progressing   Problem: RH KNOWLEDGE DEFICIT Goal: RH STG INCREASE KNOWLEDGE OF DIABETES Description: Patient will be able to identify 2 signs/symptoms of hypoglycemia and hyperglycemia. Patient will be able to verbalize 2 interventions in addition to medication for diabetes control with cues/reminders to use handouts, resources provided Outcome: Progressing Goal: RH STG INCREASE KNOWLEDGE OF HYPERTENSION Description: Patient will be able to identify what treatment medications patient is currently on and explain correlation between HTN and stroke risk using resources, handouts provided independently with cues/reminders to refer to resources Outcome: Progressing Goal: RH STG INCREASE KNOWLEGDE OF HYPERLIPIDEMIA Description: Patient will be able to verbalize what medication he is on for HLD control using cues/reminders Outcome: Progressing Goal: RH STG INCREASE KNOWLEDGE OF STROKE PROPHYLAXIS Description: Patient will be able to explain their own stroke risk factors and identify what medications they are on for stroke prophylaxis using cues/reminders and reference resources  Outcome: Progressing   Problem: Consults Goal: RH STROKE PATIENT EDUCATION Description: See Patient Education module for education specifics  Outcome:  Progressing   

## 2020-01-02 NOTE — Progress Notes (Signed)
Occupational Therapy Session Note  Patient Details  Name: Brandon Perkins MRN: [id number] Date of Birth: 1950/10/20  Today's Date: 01/02/2020 OT Individual Time: 1101-1155 OT Individual Time Calculation (min): 54 min    Short Term Goals: Week 1:  OT Short Term Goal 1 (Week 1): STG=LTG d/t ELOS  Skilled Therapeutic Interventions/Progress Updates:    Pt greeted at time of session in recliner, agreeable to OT session to work on standing balance, bathing and dressing. Pt ambulated to the sink level to wash up and change clothes, but expressed need to toilet. Ambulated to bathroom with RW with CGA and transferred to toilet and performed clothing management with supervision to CGA, decreased safety noted during transfer with use of RW. Pt performed UB/LB bathing at toilet level with supervision for UB for thoroughness and CGA for LB with pt able to stand to wash periarea and buttocks. Ambulated back to recliner where he performed UB dressing with supervision and LB dressing CGA while donning over hips but pt able to perform donning underwear, pants, socks, and shoes. Pt required extended time throughout session to perform ADL tasks for processing and problem solving, cues provided throughout for safety with RW during transfers and ambulation. Pt in recliner with call bell in reach, alarm on.   Therapy Documentation Precautions:  Precautions Precautions: Fall Restrictions Weight Bearing Restrictions: No    Therapy/Group: Individual Therapy  Erasmo Score 01/02/2020, 11:49 AM

## 2020-01-02 NOTE — Progress Notes (Signed)
Inpatient Rehabilitation  Patient information reviewed and entered into eRehab system by Herrick Hartog M. Sailor Hevia, M.A., CCC/SLP, PPS Coordinator.  Information including medical coding, functional ability and quality indicators will be reviewed and updated through discharge.    

## 2020-01-03 ENCOUNTER — Inpatient Hospital Stay (HOSPITAL_COMMUNITY): Payer: Medicare Other | Admitting: Occupational Therapy

## 2020-01-03 ENCOUNTER — Inpatient Hospital Stay (HOSPITAL_COMMUNITY): Payer: Medicare Other

## 2020-01-03 ENCOUNTER — Inpatient Hospital Stay (HOSPITAL_COMMUNITY): Payer: Medicare Other | Admitting: Physical Therapy

## 2020-01-03 NOTE — Progress Notes (Signed)
Team Conference Report to Patient/Family  Team Conference discussion was reviewed with the patient and caregiver, including goals, any changes in plan of care and target discharge date.  Patient and caregiver express understanding and are in agreement.  The patient has a target discharge date of 01/09/20. Family education set for Friday- 6/4 9-10 AM  Brandon Perkins 01/03/2020, 2:03 PM

## 2020-01-03 NOTE — Progress Notes (Signed)
Occupational Therapy Session Note  Patient Details  Name: Brandon Perkins MRN: [id number] Date of Birth: 08/26/1950  Today's Date: 01/03/2020 OT Individual Time: 4481-8563 OT Individual Time Calculation (min): 62 min    Short Term Goals: Week 1:  OT Short Term Goal 1 (Week 1): STG=LTG d/t ELOS  Skilled Therapeutic Interventions/Progress Updates:    Pt greeted at time of session sitting up in recliner shaving himself with set up, agreeable to OT session to work on shower/dressing per his request. Pt ambulated to the shower and transferred to shower bench with CGA with RW. Pt performed UB/LB bathing in standing with CGA with cues for hand/foot placement and occasionally to use grab bar for support for safety, but the pt is able to wash feet/BLEs/buttocks in standing. Pt states he does have a tub/shower combo and is open to having a shower seat or TTB at home. Pt ed on having clothes/towels easily available when exiting shower at home, but this date pt wanted to pick out his own clothes and ambulated throughout room with reaching in drawers and closet reaching for items for dynamic standing/IADL task. UB dressing supervision and LB dressing Supervision/CGA with standing to don over hips. Pt provided brief this date, while he is continent he does not have any more clean underwear and is going to ask son to bring more. Pt does require extended time for ADLs for processing and cues from therapist for problem solving at times. Pt up in chair with alarm on and call bell in reach.   Therapy Documentation Precautions:  Precautions Precautions: Fall Restrictions Weight Bearing Restrictions: No    Therapy/Group: Individual Therapy  Erasmo Score 01/03/2020, 11:40 AM

## 2020-01-03 NOTE — Patient Care Conference (Signed)
Inpatient RehabilitationTeam Conference and Plan of Care Update Date: 01/03/2020   Time: 1:56 PM    Patient Name: Brandon Perkins      Medical Record Number: [id number]  Date of Birth: 1951-05-03 Sex: Male         Room/Bed: 4M04C/4M04C-01 Payor Info: Payor: MEDICARE / Plan: MEDICARE PART A AND B / Product Type: *No Product type* /    Admit Date/Time:  12/29/2019  4:17 PM  Primary Diagnosis:  Cerebral infarction involving right cerebellar artery [facility])  Patient Active Problem List   Diagnosis Date Noted  . 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 Benign essential HTN   . Dyslipidemia   . Dysarthria, post-stroke   . Cerebral thrombosis with cerebral infarction 12/25/2019  . Hypertensive emergency 12/24/2019  . Hypokalemia 11/07/2014  . Hypertensive urgency 11/07/2014  . Elevated blood pressure 11/06/2014  . Syncope, near 11/06/2014  . GI bleeding 11/06/2014  . Hand laceration 11/06/2014    Expected Discharge Date: Expected Discharge Date: 01/09/20  Team Members Present: Physician leading conference: Dr. Claudette Laws Care Coodinator Present: Lavera Guise, BSW;Tiberius Loftus Cedric Fishman, RN, BSN, CRRN Nurse Present: Regino Schultze, RN PT Present: Harless Litten, PTA OT Present: Other (comment)(Hannah Spauch) SLP Present: Colin Benton, SLP PPS Coordinator present : Fae Pippin, [place], PT     Current Status/Progress Goal Weekly Team Focus  Bowel/Bladder   Continent and incontinent at times with bowel/bladder.  To become more continent.  Assess pt tolieting needs and answer call lights promptly.   Swallow/Nutrition/ Hydration   pooling of secretions, but no deficits noted on BSS         ADL's   CGA ambulatory bathroom transfers using RW, CGA bathing at shower level, Supervision UB dressing, Min A LB dressing, CGA toileting  Supervision overall  NMR, safety awareness, dynamic balance, ADL retraining, pt/family education    Mobility   supervision bed mobility, CGA transfers, CGA gait with RW  Mod I transfers, supervision ambulation & stairs  balance, endurance, d/c planning   Communication   70% intelligibilty at phrase level, Max-Mod A carryover of strategies. supports baseline deficits but exacerbated  Min A  awareness of reduced intelligibility and carryover of speech strategies, topic maintenance   Safety/Cognition/ Behavioral Observations  Max-Mod A, increase awareness of deficits  Min A  basic to mildly complex problem solving, intellectual/emergent awareness, recall with memory notebook and selective attention   Pain   No complaints of pain.  To remain pain free.  Assess pain level q shift or prn.   Skin   Skin is intact.  To prevent skin breakdown from occurring.  Assess skin q shift or prn.      *See Care Plan and progress notes for long and short-term goals.     Barriers to Discharge  Current Status/Progress Possible Resolutions Date Resolved   Nursing                  PT                    OT Decreased caregiver support;Lack of/limited family support  likely will require 24/7 S             SLP Decreased caregiver support              Care Coordinator Lack of/limited family support;Decreased caregiver support patient discharging home alone. Children will rotate providing supervision, 2-3 steps to enter home  Discharge Planning/Teaching Needs:       TBD  Team Discussion:  BP elevated at times, patient having trouble with left foot clearance, safety awareness, and impulsivity. Intellectual awareness deficits, and internal distractions also impair progress. Staff expressed concern about patient going home solo; discussed 24/7 supervision recommendations for son/daughter and overall goals set for MOD I.  Revisions to Treatment Plan:  Memory notebook started to help with memory.    Medical Summary Current Status: Labile BP Weekly Focus/Goal: medication adjustment  Barriers to  Discharge: Medical stability   Possible Resolutions to Barriers: see above   Continued Need for Acute Rehabilitation Level of Care: The patient requires daily medical management by a physician with specialized training in physical medicine and rehabilitation for the following reasons: Direction of a multidisciplinary physical rehabilitation program to maximize functional independence : Yes Medical management of patient stability for increased activity during participation in an intensive rehabilitation regime.: Yes Analysis of laboratory values and/or radiology reports with any subsequent need for medication adjustment and/or medical intervention. : Yes   I attest that I was present, lead the team conference, and concur with the assessment and plan of the team.   [name] 01/03/2020, 1:56 PM

## 2020-01-03 NOTE — Progress Notes (Signed)
[facility] PHYSICAL MEDICINE & REHABILITATION PROGRESS NOTE   Subjective/Complaints:  No HA, Pt eating breakfast ROS-  Pt denies SOB, abd pain, CP, N/V/C/D, and vision changes   Objective:   No results found. Recent Labs    01/01/20 0557  WBC 5.2  HGB 12.7*  HCT 40.4  PLT 237   Recent Labs    01/01/20 0557  NA 137  K 4.4  CL 100  CO2 29  GLUCOSE 105*  BUN 31*  CREATININE 1.41*  CALCIUM 9.1    Intake/Output Summary (Last 24 hours) at 01/03/2020 0747 Last data filed at 01/02/2020 1300 Gross per 24 hour  Intake 280 ml  Output 1 ml  Net 279 ml     Physical Exam: Vital Signs Blood pressure (!) 152/86, pulse 79, temperature 98.5 F (36.9 C), temperature source Oral, resp. rate 16, height 6' 2"  (1.88 m), weight 75.2 kg, SpO2 100 %.  General: No acute distress Mood and affect are appropriate Heart: Regular rate and rhythm no rubs murmurs or extra sounds Lungs: Clear to auscultation, breathing unlabored, no rales or wheezes Abdomen: Positive bowel sounds, soft nontender to palpation, nondistended Extremities: No clubbing, cyanosis, or edema Skin: No evidence of breakdown, no evidence of rash Neurologic: Cranial nerves II through XII intact, motor strength is 5/5 in bilateral deltoid, bicep, tricep, grip, hip flexor, knee extensors, ankle dorsiflexor and plantar flexor Sensory exam normal sensation to light touch and proprioception in bilateral upper and lower extremities No tremor or dysmetria noted during feeding self Musculoskeletal: Full range of motion in all 4 extremities. No joint swelling    Assessment/Plan: 1. 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  Physiatrist is providing close team supervision and 24 hour management of active medical problems listed below.  Physiatrist and rehab team continue to assess barriers to  discharge/monitor patient progress toward functional and medical goals  Care Tool:  Bathing    Body parts bathed by patient: Right arm, Left arm, Chest, Abdomen, Front perineal area, Buttocks, Right upper leg, Left upper leg, Face, Left lower leg, Right lower leg         Bathing assist Assist Level: Contact Guard/Touching assist     Upper Body Dressing/Undressing Upper body dressing   What is the patient wearing?: Pull over shirt    Upper body assist Assist Level: Supervision/Verbal cueing    Lower Body Dressing/Undressing Lower body dressing      What is the patient wearing?: Pants, Underwear/pull up     Lower body assist Assist for lower body dressing: Contact Guard/Touching assist     Toileting Toileting    Toileting assist Assist for toileting: Contact Guard/Touching assist Assistive Device Comment: cane   Transfers Chair/bed transfer  Transfers assist     Chair/bed transfer assist level: Contact Guard/Touching assist     Locomotion Ambulation   Ambulation assist      Assist level: Supervision/Verbal cueing Assistive device: Walker-rolling Max distance: 150   Walk 10 feet activity   Assist     Assist level: Supervision/Verbal cueing Assistive device: Walker-rolling   Walk 50 feet activity   Assist    Assist level: Supervision/Verbal cueing Assistive device: Walker-rolling    Walk 150 feet activity   Assist Walk 150 feet activity did not occur: Safety/medical concerns(fatigue, LE weakness, decreased postural control)  Assist level: Supervision/Verbal cueing Assistive device: Walker-rolling    Walk 10 feet  on uneven surface  activity   Assist Walk 10 feet on uneven surfaces activity did not occur: Safety/medical concerns(fatigue, LE weakness, decreased postural control)         Wheelchair     Assist Will patient use wheelchair at discharge?: No Type of Wheelchair: Manual    Wheelchair assist level:  Supervision/Verbal cueing Max wheelchair distance: 67f    Wheelchair 50 feet with 2 turns activity    Assist        Assist Level: Supervision/Verbal cueing   Wheelchair 150 feet activity     Assist      Assist Level: Maximal Assistance - Patient 25 - 49%   Blood pressure (!) 152/86, pulse 79, temperature 98.5 F (36.9 C), temperature source Oral, resp. rate 16, height 6' 2"  (1.88 m), weight 75.2 kg, SpO2 100 %.    Medical Problem List and Plan:  1. 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 Formulized individual treatment plan based on medical history, underlying problem and comorbidities. 2. Antithrombotics:  -DVT/anticoagulation: Lovenox  -antiplatelet therapy: Aspirin Plavix x3 months then aspirin alone  3. Pain Management: Tylenol as needed  Monitor for headaches.  4. Mood: Provide emotional support  -antipsychotic agents: Seroquel 12.5 mg twice daily  5. Neuropsych: This patient is ?fully capable of making decisions on his own behalf.  6. Skin/Wound Care: Routine skin checks  7. Fluids/Electrolytes/Nutrition: Routine in and outs. CMP ordered.  8. Hypertension. [place] 25 mg daily, Norvasc 10 mg daily lisinopril 10 mg daily.  Monitor with increased mobility. - Improved systolic, 6/1 Vitals:   [id number] 01/03/20 0522  BP: 130/80 (!) 152/86  Pulse: 88 79  Resp: 19 16  Temp: 98.2 F (36.8 C) 98.5 F (36.9 C)  SpO2: 100% 100%    9. Hyperlipidemia. Lipitor  10. Acute renal impairment  5/31- will decrease Lisinopril as above- and place labs for later in week.      LOS: 5 days A FACE TO FACE EVALUATION WAS PERFORMED  ACharlett Blake6/09/2019, 7:47 AM

## 2020-01-03 NOTE — Progress Notes (Signed)
Speech Language Pathology Daily Session Note  Patient Details  Name: LAZARIUS RIVKIN MRN: [id number] Date of Birth: 09/24/1950  Today's Date: 01/03/2020 SLP Individual Time: 1403-1500 SLP Individual Time Calculation (min): 57 min  Short Term Goals: Week 1: SLP Short Term Goal 1 (Week 1): STG=LTG due to short ELOS  Skilled Therapeutic Interventions:Skilled ST services focused on cognitive skills. Pt demonstrated recall of 2 out 3 rules from yesterday's ST card task and today's events with min A verbal cues to utilize memory notebook. Pt expressed difficulty reading due to baseline deficits and had greater success when reading more simplified phrases/sentences in notebook. SLP facilitated medication management providing education of current medication name/function and times per day. SLP introduced pt to "chunking" strategy to aid in recall, pt required mod A fade to min A verbal cues to recall medication function and times per day with list provided. Pt demonstrated ability to fill in x1 per day pill organizer with mod A fade to min A verbal cues for problem solving and error awareness. Pt was left in room with call bell within reach and chair alarm set. ST recommends to continue skilled ST services.     Pain Pain Assessment Pain Score: 0-No pain  Therapy/Group: Individual Therapy  Camaron Cammack  [facility] 01/03/2020, 3:15 PM

## 2020-01-03 NOTE — Progress Notes (Signed)
Physical Therapy Session Note  Patient Details  Name: Brandon Perkins MRN: [id number] Date of Birth: October 18, 1950  Today's Date: 01/03/2020 PT Individual Time: 0805-0915 PT Individual Time Calculation (min): 70 min   Short Term Goals: Week 1:  PT Short Term Goal 1 (Week 1): STG=LTG due to LOS  Skilled Therapeutic Interventions/Progress Updates: Pt presented in recliner agreeable to therapy. Pt denies pain during session. Pt donned shoes with set up and increased time to tie laces. Pt ambulated to rehab gym with CGA with verbal cues for increased L foot clearance however no LOB noted during gait. Pt participated in standing on Airex for static balance without AD then progressed to EC, gentle nudges, and shoulder flexion. Pt also participated in pip tree while standing on Airex with pt requiring min cues for completion. Pt then participated in stair training x 8 steps with B rails and CGA fading to close S. While at steps pt also participated in toe taps to 6in step, hip abd/add, hip extension, and heel rises x 15 bilaterally. Pt then returned to mat and participated in use of rebounder x 20 forward then with L/R trunk rotation for dynamic balance with pt demonstrating good safety awareness not reaching for ball when on floor the no LOB noted. Pt ambulated back to room with RW and CGA with verbal cues for increasing L foot clearance. Pt returned to recliner at end of session and left with seat alarm on, call bell within reach and needs met.      Therapy Documentation Precautions:  Precautions Precautions: Fall Restrictions Weight Bearing Restrictions: No General:   Vital Signs: Therapy Vitals Temp: 97.7 F (36.5 C) Pulse Rate: 73 Resp: 16 BP: 131/71 Patient Position (if appropriate): Lying Oxygen Therapy SpO2: 100 % O2 Device: Room Air Pain: Pain Assessment Pain Score: 0-No pain    Therapy/Group: Individual Therapy  Elsbeth Yearick  Judeen Geralds, PTA  01/03/2020, 4:23 PM

## 2020-01-04 ENCOUNTER — Inpatient Hospital Stay (HOSPITAL_COMMUNITY): Payer: Medicare Other | Admitting: Speech Pathology

## 2020-01-04 ENCOUNTER — Inpatient Hospital Stay (HOSPITAL_COMMUNITY): Payer: Medicare Other | Admitting: Occupational Therapy

## 2020-01-04 ENCOUNTER — Inpatient Hospital Stay (HOSPITAL_COMMUNITY): Payer: Medicare Other | Admitting: Physical Therapy

## 2020-01-04 MED ORDER — SODIUM CHLORIDE 0.45 % IV SOLN: INTRAVENOUS | Status: DC

## 2020-01-04 NOTE — Progress Notes (Signed)
Patient ID: Brandon Perkins, male   DOB: [birth year], 69 y.o.   MRN: [id number]  Patient set up with PCP at [facility]. Follow up Appointment set for June 22 at 8:50 AM

## 2020-01-04 NOTE — Progress Notes (Signed)
Physical Therapy Session Note  Patient Details  Name: MAHAMED ZALEWSKI MRN: [id number] Date of Birth: 10-05-1950  Today's Date: 01/04/2020 PT Individual Time: 0950-1100 PT Individual Time Calculation (min): 70 min   Short Term Goals: Week 1:  PT Short Term Goal 1 (Week 1): STG=LTG due to LOS  Skilled Therapeutic Interventions/Progress Updates:   Pt received supine in bed and agreeable to PT. Supine>sit transfer with supervision assist and  cues for safety once at EOB. Pt requested to dress and use restroom. EOB pt able to doff brief and shirt and then don pants, socks, shoes and shirt with supervision assist and min cues for safety, to sit EOB prior to donning pants and underpants. Ambulatory transfer to toilet with R@W  and supervision assist. Min cues for AD management into bathroom. Pt performed hygiene without assist.   Pt then performed hand hygiene, oral care and shaving standing at sink with supervision assist. Min cues for utilize sink for stability while standing.   Gait training over unlevel cement surface with RW x 136f and supervision assist, cues for AD management on crack as well as step height. Additional gait training with SPC x [id number]+[id number] supervision assist. Moderate cues for improved step height BLE to reduce fall risk. Prolonged rest break between bouts due to mild fatigue.   Patient returned to room and performed stand pivot to recliner with [facility] assist for safety. Pt left sitting in recliner with call bell in reach and all needs met.         Therapy Documentation Precautions:  Precautions Precautions: Fall Restrictions Weight Bearing Restrictions: No Pain:   denies    Therapy/Group: Individual Therapy  AusLorie Phenix3/2021, 11:00 AM

## 2020-01-04 NOTE — Progress Notes (Signed)
Speech Language Pathology Daily Session Note  Patient Details  Name: Brandon Perkins MRN: [id number] Date of Birth: 08/08/1950  Today's Date: 01/04/2020 SLP Individual Time: 2248-2500 SLP Individual Time Calculation (min): 48 min  Short Term Goals: Week 1: SLP Short Term Goal 1 (Week 1): STG=LTG due to short ELOS  Skilled Therapeutic Interventions:   Bedside swallow eval: Per MD request and given pt's reports of coughing with PO intake and baseline wet vocal quality, bedside swallow evaluation was repeated today. Pt exhibited strong cough or throat clear response in ~60% of trials of thin via cup and straw. Wet vocal quality was also present following ~80% of trials. Pt's volitional throat clear is strong and effective to clear wet vocal quality. Oral transit, mastication, and oral clearance of regular texture solids. Recommend continue current diet for now, however pt should clear his throat after each sip of liquid and instrumental MBSS has been scheduled for tomorrow 01/05/20 to further assess oropharyngeal swallow function. Would also recommend pt take medications whole in applesauce as opposed to thins until results of tomorrow's study.  Skilled treatment portion of session focused on cognition. Pt required Mod A verbal and visual cues to use aids in room to recall novel information such as medications and speech intelligibility strategies. He also required Moderate assistance to utilize memory notebook in any functional manner. He reported feeling as though "the days run together" and disoriented to date (including year) therefore calendar provided. Discussed benefit of using memory notebook to assist with discriminating between days and daily activities. Pt left laying in bed with alarm set and needs within reach. Continue per current plan of care.        Pain Pain Assessment Pain Scale: 0-10 Pain Score: 0-No pain  Therapy/Group: Individual Therapy  JOHNNY GORTER 01/04/2020, 7:05 AM

## 2020-01-04 NOTE — Progress Notes (Signed)
Occupational Therapy Session Note  Patient Details  Name: Brandon Perkins MRN: [id number] Date of Birth: Apr 01, 1951  Today's Date: 01/04/2020 OT Individual Time: 0122-2411 OT Individual Time Calculation (min): 55 min    Short Term Goals: Week 1:  OT Short Term Goal 1 (Week 1): STG=LTG d/t ELOS  Skilled Therapeutic Interventions/Progress Updates:    Pt greeted up in recliner, agreeable to OT session to work on functional mobility and IADL kitchen tasks. Pt first had to use the toilet, ambulated with RW to the bathroom and performed all aspects of toileting with supervision - CGA with pt able to static stand without support to don/doff clothing and perform hygiene. Ambulated to kitchen for IADL task of reaching for various heights/cabinets/drawers, etc. With education on keeping most frequently used items on a lower cabinet and moving items from low drawers up to higher ones to decrease risk of falls. Pt ed and training on staying in his RW when performing kitchen tasks and taking with him, not using unilateral support when reaching, and safe use of counter tops for support. Pt receptive to information but stated he normally goes out to eat, pt ed on having children perform driving and community assistance when he first goes home for safety. Pt stood for entire IADL session approximately 20 minutes without rest break and noted to have decreased LLE foot clearance when walking back to his room. Transferred back to recliner Supervision - CGA, alarm on, call bell in reach, all needs met.   Therapy Documentation Precautions:  Precautions Precautions: Fall Restrictions Weight Bearing Restrictions: No    Therapy/Group: Individual Therapy  Viona Gilmore 01/04/2020, 3:52 PM

## 2020-01-04 NOTE — Progress Notes (Signed)
PHYSICAL MEDICINE & REHABILITATION PROGRESS NOTE   Subjective/Complaints:   ROS-  Pt denies SOB, abd pain, CP, N/V/C/D, and vision changes   Objective:   No results found. No results for input(s): WBC, HGB, HCT, PLT in the last 72 hours. No results for input(s): NA, K, CL, CO2, GLUCOSE, BUN, CREATININE, CALCIUM in the last 72 hours.  Intake/Output Summary (Last 24 hours) at 01/04/2020 0730 Last data filed at 01/03/2020 1300 Gross per 24 hour  Intake 300 ml  Output --  Net 300 ml     Physical Exam: Vital Signs Blood pressure (!) 144/78, pulse 70, temperature 98 F (36.7 C), temperature source Oral, resp. rate 16, height 6\' 2"  (1.88 m), weight 75.2 kg, SpO2 100 %.   General: No acute distress Mood and affect are appropriate Heart: Regular rate and rhythm no rubs murmurs or extra sounds Lungs: Clear to auscultation, breathing unlabored, no rales or wheezes Abdomen: Positive bowel sounds, soft nontender to palpation, nondistended Extremities: No clubbing, cyanosis, or edema Skin: No evidence of breakdown, no evidence of rash  Sensory exam normal sensation to light touch and proprioception in bilateral upper and lower extremities No tremor or dysmetria noted during feeding self Musculoskeletal: Full range of motion in all 4 extremities. No joint swelling    Assessment/Plan: 1. 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  Physiatrist is providing close team supervision and 24 hour management of active medical problems listed below.  Physiatrist and rehab team continue to assess barriers to discharge/monitor patient progress toward functional and medical goals  Care Tool:  Bathing    Body parts bathed by patient: Right arm, Left arm, Chest, Abdomen, Front perineal area, Buttocks, Right upper leg, Left upper leg, Face, Left lower  leg, Right lower leg         Bathing assist Assist Level: Contact Guard/Touching assist     Upper Body Dressing/Undressing Upper body dressing   What is the patient wearing?: Pull over shirt    Upper body assist Assist Level: Supervision/Verbal cueing    Lower Body Dressing/Undressing Lower body dressing      What is the patient wearing?: Pants, Underwear/pull up     Lower body assist Assist for lower body dressing: Contact Guard/Touching assist     Toileting Toileting    Toileting assist Assist for toileting: Contact Guard/Touching assist Assistive Device Comment: cane   Transfers Chair/bed transfer  Transfers assist     Chair/bed transfer assist level: Contact Guard/Touching assist     Locomotion Ambulation   Ambulation assist      Assist level: Supervision/Verbal cueing Assistive device: Walker-rolling Max distance: 150   Walk 10 feet activity   Assist     Assist level: Supervision/Verbal cueing Assistive device: Walker-rolling   Walk 50 feet activity   Assist    Assist level: Supervision/Verbal cueing Assistive device: Walker-rolling    Walk 150 feet activity   Assist Walk 150 feet activity did not occur: Safety/medical concerns(fatigue, LE weakness, decreased postural control)  Assist level: Supervision/Verbal cueing Assistive device: Walker-rolling    Walk 10 feet on uneven surface  activity   Assist Walk 10 feet on uneven surfaces activity did not occur: Safety/medical concerns(fatigue, LE weakness, decreased postural control)         Wheelchair     Assist Will patient use wheelchair at discharge?: No Type of Wheelchair: Manual  Wheelchair assist level: Supervision/Verbal cueing Max wheelchair distance: 64ft    Wheelchair 50 feet with 2 turns activity    Assist        Assist Level: Supervision/Verbal cueing   Wheelchair 150 feet activity     Assist      Assist Level: Maximal Assistance -  Patient 25 - 49%   Blood pressure (!) 144/78, pulse 70, temperature 98 F (36.7 C), temperature source Oral, resp. rate 16, height 6\' 2"  (1.88 m), weight 75.2 kg, SpO2 100 %.    Medical Problem List and Plan:  1. 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 Antithrombotics:  -DVT/anticoagulation: Lovenox  -antiplatelet therapy: Aspirin Plavix x3 months then aspirin alone  3. Pain Management: Tylenol as needed  Monitor for headaches.  4. Mood: Provide emotional support  -antipsychotic agents: Seroquel 12.5 mg twice daily  5. Neuropsych: This patient is ?fully capable of making decisions on his own behalf.  6. Skin/Wound Care: Routine skin checks  7. Fluids/Electrolytes/Nutrition: Routine in and outs. CMP ordered.  8. Hypertension. Hygroton 25 mg daily, Norvasc 10 mg daily lisinopril 10 mg daily.  Monitor with increased mobility. - Improved systolic, 6/3 Vitals:   74/12/87 2017 01/04/20 0649  BP: 130/72 (!) 144/78  Pulse: (!) 59 70  Resp: 18 16  Temp: (!) 97.4 F (36.3 C) 98 F (36.7 C)  SpO2: 99% 100%    9. Hyperlipidemia. Lipitor  10. Acute renal impairment repeat BMET today  Off hygroton an don lower dose Zestril, fluid intake poor 11.  Wet voice and cough  SLP to re eval, poor fluid intake    LOS: 6 days A FACE TO FACE EVALUATION WAS PERFORMED  Charlett Blake 01/04/2020, 7:30 AM

## 2020-01-05 ENCOUNTER — Inpatient Hospital Stay (HOSPITAL_COMMUNITY): Payer: Medicare Other | Admitting: Physical Therapy

## 2020-01-05 ENCOUNTER — Inpatient Hospital Stay (HOSPITAL_COMMUNITY): Payer: Medicare Other

## 2020-01-05 ENCOUNTER — Inpatient Hospital Stay (HOSPITAL_COMMUNITY): Payer: Medicare Other | Admitting: Occupational Therapy

## 2020-01-05 ENCOUNTER — Encounter (HOSPITAL_COMMUNITY): Payer: Medicare Other | Admitting: Speech Pathology

## 2020-01-05 LAB — CREATININE, SERUM
Creatinine, Ser: 1.36 mg/dL — ABNORMAL HIGH (ref 0.61–1.24)
GFR calc Af Amer: >60 mL/min (ref >60)
GFR calc non Af Amer: 53 mL/min — ABNORMAL LOW (ref >60)

## 2020-01-05 NOTE — Progress Notes (Signed)
Occupational Therapy Session Note  Patient Details  Name: DELDRICK Perkins MRN: [id number] Date of Birth: Jul 27, 1951  Today's Date: 01/05/2020 OT Individual Time: 3543-0148 OT Individual Time Calculation (min): 72 min    Short Term Goals: Week 1:  OT Short Term Goal 1 (Week 1): STG=LTG d/t ELOS  Skilled Therapeutic Interventions/Progress Updates:    Pt greeted at time of session in bed, pt feeling more tired stating he had a visit from his daughter today and had a "swallowing test" done just prior to OT session and is resting. Pt's lunch in his room, untouched and pt wanted to eat prior to activity. Supine to sitting EOB with supervision, ambulated to recliner with SPC with Supervision-CGA and performed self feeding with occasional supervision as he had several episodes of coughing and cues provided according to SLP instructions. Pt able to open small containers and perform in hand manipulation of objects for self feeding. Sit to stands with Supervision, ambulated throughout room while maneuvering obstacles with [facility] for IADL task for hanging up clothes in dresser. Pt ambulated with SPC to bathroom and able to stand to perform toileting 3/3 aspects with supervision - CGA. Hygiene tasks in standing at sink without UE support and ambulated back to chair to participate in [facility] activities for pinching of small objects with L hand, crossing midline and placing in another cup. Pt ed on [facility] and importance of practicing in his room to improve functional use. Pt up in chair with alarm on, call bell in reach, and all needs met.   Therapy Documentation Precautions:  Precautions Precautions: Fall Restrictions Weight Bearing Restrictions: No   Therapy/Group: Individual Therapy  Brandon Perkins 01/05/2020, 1:34 PM

## 2020-01-05 NOTE — Progress Notes (Signed)
Physical Therapy Session Note  Patient Details  Name: Brandon Perkins MRN: [id number] Date of Birth: 1951-08-03  Today's Date: 01/05/2020 PT Individual Time: 1020-1115 AND 1550-1630 PT Individual Time Calculation (min): 55 min AND 40 MIN   Short Term Goals: Week 1:  PT Short Term Goal 1 (Week 1): STG=LTG due to LOS  Skilled Therapeutic Interventions/Progress Updates:  Session 1  Pt received sitting in WC and agreeable to PT. Pt donned socks and shoes with set up and assistd and increased time. Family present for education throughout session.   Gait training with RW  X 145f, 1853fand 6047fnd with SPC x [id number] 75ft 87f level and unlevel surfaces up down ramp and over mulch. Supervision assist overall from PT with cues for improved step height throughout. Min assist with SPC overmulch, so PT encouraged use of RW over gravel at pt's house.   Stair management training x 8 steps with BUE support and supervision assist. Car transfer training with supervision assist and SPC with cues to sit prior to pivot and use of small car in the near future.   PT provided pt and family with HEP for supine strengthening and dynamic balance training of modified [place] level A balance program with plans to have pt demonstrate exercises in PM session   Patient returned to room and performed stand pivot to recliner with supervision assist and SPC. Pt left sitting in recliner with  call bell in reach and all needs met.    Session 2.   Pt received sitting in WC and agreeable to PT. Gait training through hall with SPC 2 [place]60ft. 45fnstructed pt in modified [place] balance program. LAQ. Minisquats, hip abduction, HS curls, sit<>stand. Dynamic balance training: Forwards/reverse gait. 5ft eac55f 4. Lateral stepping R and L 6 ft bil with min assist. Cues for posture and decreased compensations throughout as well as min assist for safety with 2 near LOB requiring assist from PT to prevent fall to the L.  Patient returned to room and left sitting inrecliner with call bell in reach and all needs met.          Therapy Documentation Precautions:  Precautions Precautions: Fall Restrictions Weight Bearing Restrictions: No    Pain: denies   Therapy/Group: Individual Therapy  [place] Lapaglia ELorie Phenix1, 1:03 PM

## 2020-01-05 NOTE — Progress Notes (Signed)
[place] PHYSICAL MEDICINE & REHABILITATION PROGRESS NOTE   Subjective/Complaints: Eating breakfast without coughing  Dysarthric, discussed swallowing issues with SLP  ROS-  Pt denies SOB, abd pain, CP, N/V/C/D, and vision changes   Objective:   No results found. No results for input(s): WBC, HGB, HCT, PLT in the last 72 hours. No results for input(s): NA, K, CL, CO2, GLUCOSE, BUN, CREATININE, CALCIUM in the last 72 hours.  Intake/Output Summary (Last 24 hours) at 01/05/2020 0732 Last data filed at 01/05/2020 0600 Gross per 24 hour  Intake 1421.29 ml  Output --  Net 1421.29 ml     Physical Exam: Vital Signs Blood pressure (!) 104/55, pulse (!) 55, temperature 97.8 F (36.6 C), temperature source Oral, resp. rate 14, height 6\' 2"  (1.88 m), weight 75.2 kg, SpO2 100 %.  General: No acute distress Mood and affect are appropriate Heart: Regular rate and rhythm no rubs murmurs or extra sounds Lungs: Clear to auscultation, breathing unlabored, no rales or wheezes Abdomen: Positive bowel sounds, soft nontender to palpation, nondistended Extremities: No clubbing, cyanosis, or edema  Mild dysmetria RIght FNF Musculoskeletal: Full range of motion in all 4 extremities. No joint swelling    Assessment/Plan: 1. 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  Physiatrist is providing close team supervision and 24 hour management of active medical problems listed below.  Physiatrist and rehab team continue to assess barriers to discharge/monitor patient progress toward functional and medical goals  Care Tool:  Bathing    Body parts bathed by patient: Right arm, Left arm, Chest, Abdomen, Front perineal area, Buttocks, Right upper leg, Left upper leg, Face, Left lower leg, Right lower leg         Bathing assist Assist Level: Contact Guard/Touching  assist     Upper Body Dressing/Undressing Upper body dressing   What is the patient wearing?: Pull over shirt    Upper body assist Assist Level: Supervision/Verbal cueing    Lower Body Dressing/Undressing Lower body dressing      What is the patient wearing?: Pants, Underwear/pull up     Lower body assist Assist for lower body dressing: Contact Guard/Touching assist     Toileting Toileting    Toileting assist Assist for toileting: Contact Guard/Touching assist Assistive Device Comment: cane   Transfers Chair/bed transfer  Transfers assist     Chair/bed transfer assist level: Contact Guard/Touching assist     Locomotion Ambulation   Ambulation assist      Assist level: Supervision/Verbal cueing Assistive device: Walker-rolling Max distance: 150   Walk 10 feet activity   Assist     Assist level: Supervision/Verbal cueing Assistive device: Walker-rolling   Walk 50 feet activity   Assist    Assist level: Supervision/Verbal cueing Assistive device: Walker-rolling    Walk 150 feet activity   Assist Walk 150 feet activity did not occur: Safety/medical concerns(fatigue, LE weakness, decreased postural control)  Assist level: Supervision/Verbal cueing Assistive device: Walker-rolling    Walk 10 feet on uneven surface  activity   Assist Walk 10 feet on uneven surfaces activity did not occur: Safety/medical concerns(fatigue, LE weakness, decreased postural control)         Wheelchair     Assist Will patient use wheelchair at discharge?: No Type of Wheelchair: Manual    Wheelchair assist level: Supervision/Verbal cueing Max wheelchair distance: 33ft    Wheelchair 50 feet with 2 turns  activity    Assist        Assist Level: Supervision/Verbal cueing   Wheelchair 150 feet activity     Assist      Assist Level: Maximal Assistance - Patient 25 - 49%   Blood pressure (!) 104/55, pulse (!) 55, temperature 97.8 F  (36.6 C), temperature source Oral, resp. rate 14, height 6\' 2"  (1.88 m), weight 75.2 kg, SpO2 100 %.    Medical Problem List and Plan:  1. 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 Antithrombotics:  -DVT/anticoagulation: Lovenox  -antiplatelet therapy: Aspirin Plavix x3 months then aspirin alone  3. Pain Management: Tylenol as needed  Monitor for headaches.  4. Mood: Provide emotional support  -antipsychotic agents: Seroquel 12.5 mg twice daily  5. Neuropsych: This patient is ?fully capable of making decisions on his own behalf.  6. Skin/Wound Care: Routine skin checks  7. Fluids/Electrolytes/Nutrition: Routine in and outs. CMP ordered.  8. Hypertension. Hygroton 25 mg daily, Norvasc 10 mg daily lisinopril 10 mg daily.  Monitor with increased mobility. - Improved 6/4 Vitals:   01/04/20 2025 01/05/20 0500  BP: 110/61 (!) 104/55  Pulse: 63 (!) 55  Resp: 16 14  Temp: 98.5 F (36.9 C) 97.8 F (36.6 C)  SpO2: 97% 100%    9. Hyperlipidemia. Lipitor  10. Acute renal impairment repeat BMET today  Off hygroton an don lower dose Zestril, fluid intake poor 11.  Wet voice and cough No clinical sign of pneumonia  SLP eval, MBS today    LOS: 7 days A FACE TO FACE EVALUATION WAS PERFORMED  Charlett Blake 01/05/2020, 7:32 AM

## 2020-01-05 NOTE — Progress Notes (Signed)
Left forearm IV removed by NT T. Ladona Ridgel for check off of competency. IV removed without difficulty, catheter tip intact, pressure held until hemostasis occurred. No s/s of distress. Will continue to monitor site.

## 2020-01-05 NOTE — Progress Notes (Signed)
Modified Barium Swallow Progress Note  Patient Details  Name: Brandon Perkins MRN: [id number] Date of Birth: [birth year]  Today's Date: 01/05/2020  Modified Barium Swallow completed.  Full report located under Chart Review in the Imaging Section.  Brief recommendations include the following:  Clinical Impression   Pt presents with a mild pharyngoesophageal dysphagia.  Pt's swallow response was triggered at the pyriform sinuses which in combination with decreased anterior hyolaryngeal movement, decreased UES opening and concomitant residue at the pyriforms and CP segment lead to penetration with most consistencies.  Penetration remained above the level of the cords with the exception of several larger volume boluses which contacted the cords.  Material never passed below the vocal cords and pt was able to clear penetrates with cues for a volitional cough followed by a second swallow.  Penetration was not sensed in any occurrence although I suspect that had pt aspirated or penetrated larger volumes that a cough would have been triggered due to reports of coughing at bedside.   I recommend that pt remain on his currently prescribed diet with ST follow up for use of esophageal swallowing precautions.  SLP initiated education regarding alternating solids and liquids, remaining upright for 30-60 minutes after meals, and using a throat clear followed by a second swallow every few bites and sips but pt will need reinforcement and family would benefit from education prior to discharge.     Swallow Evaluation Recommendations       SLP Diet Recommendations: Regular solids;Thin liquid   Liquid Administration via: Cup;Straw   Medication Administration: Whole meds with puree   Supervision: Patient able to self feed   Compensations: Minimize environmental distractions;Small sips/bites;Slow rate;Other (Comment);Follow solids with liquid;Clear throat intermittently   Postural Changes: Remain semi-upright after  after feeds/meals (Comment);Seated upright at 90 degrees   Oral Care Recommendations: Oral care BID        Baker Moronta, Melanee Spry 01/05/2020,1:01 PM

## 2020-01-06 ENCOUNTER — Inpatient Hospital Stay (HOSPITAL_COMMUNITY): Payer: Medicare Other | Admitting: Physical Therapy

## 2020-01-06 ENCOUNTER — Inpatient Hospital Stay (HOSPITAL_COMMUNITY): Payer: Medicare Other | Admitting: Occupational Therapy

## 2020-01-06 DIAGNOSIS — N179 Acute kidney failure, unspecified: Secondary | ICD-10-CM

## 2020-01-06 DIAGNOSIS — E785 Hyperlipidemia, unspecified: Secondary | ICD-10-CM

## 2020-01-06 DIAGNOSIS — I10 Essential (primary) hypertension: Secondary | ICD-10-CM

## 2020-01-06 NOTE — Progress Notes (Signed)
Physical Therapy Session Note  Patient Details  Name: Brandon Perkins MRN: [id number] Date of Birth: Nov 01, 1950  Today's Date: 01/06/2020 PT Individual Time: 0905-0930 PT Individual Time Calculation (min): 25 min   Short Term Goals: Week 1:  PT Short Term Goal 1 (Week 1): STG=LTG due to LOS  Skilled Therapeutic Interventions/Progress Updates:   Pt received supine in bed and agreeable to PT. Supine>sit transfer without assist or cues. PT assisted pt to perform upper and lower body dressing with set up assist only. Sit<>stand to pull pants to waist with supervision assist and no AD. Once pants donned pt reports need to urinate. Ambulatory transfer without AD and min assist. Mild LOB over threshold without AD. PT provided pt with SPC. ambulatory  Transfer to sink. Pt performed facial care including shaving, wash and teeth brushing with distant supervision assist for safety. Returned to recliner with SPC and supervision assist, th pt donned shoes with supervision assist and cues for lace management. Pt left sitting in recliner with call bell in reach and all needs met       Therapy Documentation Precautions:  Precautions Precautions: Fall Restrictions Weight Bearing Restrictions: No Pain: denies  Therapy/Group: Individual Therapy  Lorie Phenix 01/06/2020, 9:46 AM

## 2020-01-06 NOTE — Progress Notes (Signed)
[facility] PHYSICAL MEDICINE & REHABILITATION PROGRESS NOTE   Subjective/Complaints: Patient seen sitting up in bed this morning eating breakfast.  Assisted patient in locating his TV remote.  He states he slept well overnight.  ROS: Denies CP, SOB, N/V/D  Objective:   DG Swallowing Func-Speech Pathology  Result Date: 01/05/2020 Objective Swallowing Evaluation: Type of Study: MBS-Modified Barium Swallow Study  Patient Details Name: Brandon Perkins MRN: [id number] Date of Birth: 1950-11-04 Today's Date: 01/05/2020 1200-1230 30 minutes Past Medical History: Past Medical History: Diagnosis Date . Blood clot in vein   left arm vein blood clot, not know the detail, treated with ASA per pt . Cataract   OU . Hypertension  . Hypertensive retinopathy   OU Past Surgical History: No past surgical history on file. HPI: Pt is a 69 yo male presenting with R sided weakness with aphasia and slurred speech. 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 PMH: HTN, HTN retinopathy, GIB  Subjective: alert, pleasant Assessment / Plan / Recommendation CHL IP CLINICAL IMPRESSIONS 01/05/2020 Clinical Impression Pt presents with a mild pharyngoesophageal dysphagia.  Pt's swallow response was triggered at the pyriform sinuses which in combination with decreased anterior hyolaryngeal movement, decreased UES opening and concomitant residue at the pyriforms and CP segment lead to penetration with most consistencies.  Penetration remained above the level of the cords with the exception of several larger volume boluses which contacted the cords.  Material never passed below the vocal cords and pt was able to clear penetrates with cues for a volitional cough followed by a second swallow.  Penetration was not sensed in any occurrence although I suspect that had pt aspirated or penetrated larger volumes that a cough would have been triggered due to reports of coughing at bedside.   I recommend that pt remain  on his currently prescribed diet with ST follow up for use of esophageal swallowing precautions.  SLP initiated education regarding alternating solids and liquids, remaining upright for 30-60 minutes after meals, and using a throat clear followed by a second swallow every few bites and sips but pt will need reinforcement and family would benefit from education prior to discharge.   SLP Visit Diagnosis Dysphagia, pharyngoesophageal phase (R13.14) Attention and concentration deficit following -- Frontal lobe and executive function deficit following -- Impact on safety and function Mild aspiration risk   No flowsheet data found.  Prognosis 01/05/2020 Prognosis for Safe Diet Advancement Good Barriers to Reach Goals -- Barriers/Prognosis Comment -- CHL IP DIET RECOMMENDATION 01/05/2020 SLP Diet Recommendations Regular solids;Thin liquid Liquid Administration via Cup;Straw Medication Administration Whole meds with puree Compensations Minimize environmental distractions;Small sips/bites;Slow rate;Other (Comment);Follow solids with liquid;Clear throat intermittently Postural Changes Remain semi-upright after after feeds/meals (Comment);Seated upright at 90 degrees   CHL IP OTHER RECOMMENDATIONS 01/05/2020 Recommended Consults -- Oral Care Recommendations Oral care BID Other Recommendations --   CHL IP FOLLOW UP RECOMMENDATIONS 01/05/2020 Follow up Recommendations 24 hour supervision/assistance;Outpatient SLP;Home health SLP   CHL IP FREQUENCY AND DURATION 12/25/2019 Speech Therapy Frequency (ACUTE ONLY) min 2x/week Treatment Duration --      CHL IP ORAL PHASE 01/05/2020 Oral Phase WFL Oral - Pudding Teaspoon -- Oral - Pudding Cup -- Oral - Honey Teaspoon -- Oral - Honey Cup -- Oral - Nectar Teaspoon -- Oral - Nectar Cup -- Oral - Nectar Straw -- Oral - Thin Teaspoon -- Oral - Thin Cup -- Oral - Thin Straw -- Oral - Puree -- Oral - Mech Soft --  Oral - Regular -- Oral - Multi-Consistency -- Oral - Pill -- Oral Phase - Comment --  CHL IP  PHARYNGEAL PHASE 01/05/2020 Pharyngeal Phase Impaired Pharyngeal- Pudding Teaspoon -- Pharyngeal -- Pharyngeal- Pudding Cup -- Pharyngeal -- Pharyngeal- Honey Teaspoon -- Pharyngeal -- Pharyngeal- Honey Cup -- Pharyngeal -- Pharyngeal- Nectar Teaspoon -- Pharyngeal -- Pharyngeal- Nectar Cup -- Pharyngeal -- Pharyngeal- Nectar Straw -- Pharyngeal -- Pharyngeal- Thin Teaspoon -- Pharyngeal -- Pharyngeal- Thin Cup Delayed swallow initiation-pyriform sinuses;Reduced anterior laryngeal mobility;Reduced airway/laryngeal closure;Penetration/Aspiration during swallow;Pharyngeal residue - pyriform;Pharyngeal residue - cp segment Pharyngeal Material enters airway, remains ABOVE vocal cords and not ejected out Pharyngeal- Thin Straw Delayed swallow initiation-pyriform sinuses;Reduced anterior laryngeal mobility;Reduced airway/laryngeal closure;Pharyngeal residue - pyriform;Pharyngeal residue - cp segment;Penetration/Aspiration during swallow Pharyngeal Material enters airway, remains ABOVE vocal cords and not ejected out Pharyngeal- Puree Reduced anterior laryngeal mobility;Delayed swallow initiation-pyriform sinuses;Reduced airway/laryngeal closure;Pharyngeal residue - cp segment;Penetration/Aspiration during swallow;Pharyngeal residue - pyriform Pharyngeal Material enters airway, remains ABOVE vocal cords and not ejected out Pharyngeal- Mechanical Soft -- Pharyngeal -- Pharyngeal- Regular Delayed swallow initiation-pyriform sinuses;Reduced airway/laryngeal closure;Reduced anterior laryngeal mobility;Pharyngeal residue - pyriform;Pharyngeal residue - cp segment Pharyngeal -- Pharyngeal- Multi-consistency -- Pharyngeal -- Pharyngeal- Pill Delayed swallow initiation-pyriform sinuses;Reduced anterior laryngeal mobility;Pharyngeal residue - pyriform;Pharyngeal residue - cp segment;Penetration/Aspiration during swallow Pharyngeal Material enters airway, CONTACTS cords and not ejected out Pharyngeal Comment --  CHL IP CERVICAL  ESOPHAGEAL PHASE 01/05/2020 Cervical Esophageal Phase Impaired Pudding Teaspoon -- Pudding Cup -- Honey Teaspoon -- Honey Cup -- Nectar Teaspoon -- Nectar Cup -- Nectar Straw -- Thin Teaspoon -- Thin Cup Reduced cricopharyngeal relaxation Thin Straw Reduced cricopharyngeal relaxation Puree Reduced cricopharyngeal relaxation Mechanical Soft -- Regular Reduced cricopharyngeal relaxation Multi-consistency -- Pill Reduced cricopharyngeal relaxation Cervical Esophageal Comment -- Emilio Math 01/05/2020, 3:48 PM              No results for input(s): WBC, HGB, HCT, PLT in the last 72 hours. Recent Labs    01/05/20 0547  CREATININE 1.36*    Intake/Output Summary (Last 24 hours) at 01/06/2020 1151 Last data filed at 01/06/2020 0843 Gross per 24 hour  Intake 542 ml  Output 200 ml  Net 342 ml     Physical Exam: Vital Signs Blood pressure 112/67, pulse 61, temperature 97.6 F (36.4 C), resp. rate 18, height 6\' 2"  (1.88 m), weight 75.2 kg, SpO2 100 %. Constitutional: No distress . Vital signs reviewed. HENT: Normocephalic.  Atraumatic. Eyes: EOMI. No discharge. Cardiovascular: No JVD. Respiratory: Normal effort.  No stridor. GI: Non-distended. Skin: Warm and dry.  Intact. Psych: Some confusion Musc: No edema in extremities.  No tenderness in extremities. Neuro: Alert LUE/LLE: 4+/5 proximal distal  Assessment/Plan: 1. 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  Physiatrist is providing close team supervision and 24 hour management of active medical problems listed below.  Physiatrist and rehab team continue to assess barriers to discharge/monitor patient progress toward functional and medical goals  Care Tool:  Bathing    Body parts bathed by patient: Right arm, Left arm, Chest, Abdomen, Front perineal area, Buttocks, Right upper leg, Left upper leg, Face,  Left lower leg, Right lower leg         Bathing assist Assist Level: Contact Guard/Touching assist     Upper Body Dressing/Undressing Upper body dressing   What is the patient wearing?: Pull over shirt    Upper body  assist Assist Level: Supervision/Verbal cueing    Lower Body Dressing/Undressing Lower body dressing      What is the patient wearing?: Pants, Underwear/pull up     Lower body assist Assist for lower body dressing: Contact Guard/Touching assist     Toileting Toileting    Toileting assist Assist for toileting: Supervision/Verbal cueing Assistive Device Comment: cane   Transfers Chair/bed transfer  Transfers assist     Chair/bed transfer assist level: Supervision/Verbal cueing     Locomotion Ambulation   Ambulation assist      Assist level: Contact Guard/Touching assist Assistive device: Cane-straight Max distance: 100'   Walk 10 feet activity   Assist     Assist level: Contact Guard/Touching assist Assistive device: Cane-straight   Walk 50 feet activity   Assist    Assist level: Contact Guard/Touching assist Assistive device: Cane-straight    Walk 150 feet activity   Assist Walk 150 feet activity did not occur: Safety/medical concerns(fatigue, LE weakness, decreased postural control)  Assist level: Supervision/Verbal cueing Assistive device: Walker-rolling    Walk 10 feet on uneven surface  activity   Assist Walk 10 feet on uneven surfaces activity did not occur: Safety/medical concerns(fatigue, LE weakness, decreased postural control)         Wheelchair     Assist Will patient use wheelchair at discharge?: No Type of Wheelchair: Manual    Wheelchair assist level: Supervision/Verbal cueing Max wheelchair distance: 33ft    Wheelchair 50 feet with 2 turns activity    Assist        Assist Level: Supervision/Verbal cueing   Wheelchair 150 feet activity     Assist      Assist Level: Maximal  Assistance - Patient 25 - 49%   Blood pressure 112/67, pulse 61, temperature 97.6 F (36.4 C), resp. rate 18, height 6\' 2"  (1.88 m), weight 75.2 kg, SpO2 100 %.    Medical Problem List and Plan:  1. 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 Antithrombotics:  -DVT/anticoagulation: Lovenox  -antiplatelet therapy: Aspirin Plavix x3 months then aspirin alone  3. Pain Management: Tylenol as needed  Monitor for headaches.  4. Mood: Provide emotional support  -antipsychotic agents: Seroquel 12.5 mg twice daily  5. Neuropsych: This patient is ?fully capable of making decisions on his own behalf.  6. Skin/Wound Care: Routine skin checks  7. Fluids/Electrolytes/Nutrition: Routine in and outs.  8. Hypertension. Hygroton 25 mg daily, Norvasc 10 mg daily lisinopril 10 mg daily.   Controlled on 6/5  Monitor with increased mobility. Vitals:   01/05/20 2013 01/06/20 0531  BP: 132/84 112/67  Pulse: 85 61  Resp: 19 18  Temp: 98.7 F (37.1 C) 97.6 F (36.4 C)  SpO2: 93% 100%    9. Hyperlipidemia.  Lipitor 10. Acute renal impairment   Off hygroton and on lower dose Zestril, fluid intake poor  Creatinine 1.36 on 6/12, labs ordered for Monday 11.  Wet voice and cough   No clinical sign of pneumonia    SLP eval, on regular thins  Improving  LOS: 8 days A FACE TO FACE EVALUATION WAS PERFORMED  Aixa Corsello Monday 01/06/2020, 11:51 AM

## 2020-01-06 NOTE — Progress Notes (Signed)
Occupational Therapy Session Note  Patient Details  Name: Brandon Perkins MRN: [id number] Date of Birth: [birth year]  Today's Date: 01/06/2020 OT Individual Time: 1015-1100 OT Individual Time Calculation (min): 45 min    Short Term Goals: Week 1:  OT Short Term Goal 1 (Week 1): STG=LTG d/t ELOS  Skilled Therapeutic Interventions/Progress Updates:    Upon entering the room, pt seated in recliner chair awaiting OT arrival with no c/o pain and agreeable to OT intervention. Pt standing with supervision and ambulating with SPC and CGA to sink to wash hands per his request. Pt ambulating 100' with SPC with S - min guard for balance into ADL apartment. Pt taking seated rest break in recliner chair. Pt standing from chair with CGA and making bed without use of AD with min guard for balance and increased time (23 minutes). Pt engaged in functional bilateral coordination task of folding sheets and returning to dresser drawers as well with CGA. Pt returning back to room with [facility] and CGA as stated above. Pt returning to sit in recliner chair with chair alarm activated and call bell within reach.   Therapy Documentation Precautions:  Precautions Precautions: Fall Restrictions Weight Bearing Restrictions: No   Therapy/Group: Individual Therapy  Alen Bleacher 01/06/2020, 11:35 AM

## 2020-01-07 ENCOUNTER — Inpatient Hospital Stay (HOSPITAL_COMMUNITY): Payer: Medicare Other | Admitting: Occupational Therapy

## 2020-01-07 NOTE — Progress Notes (Addendum)
Occupational Therapy Session Note  Patient Details  Name: Brandon Perkins MRN: [id number] Date of Birth: 24-Aug-1950  Today's Date: 01/07/2020 OT Individual Time: 1004-1105 OT Individual Time Calculation (min): 61 min   Short Term Goals: Week 1:  OT Short Term Goal 1 (Week 1): STG=LTG d/t ELOS  Skilled Therapeutic Interventions/Progress Updates:    Pt greeted in bed with no c/o pain. Supine<sit from flat bed without bedrail completed with cuing to manage blankets prior to power up into standing. He completed bathing (sit<stand from TTB), dressing (sit<stand from EOB + recliner), shaving (standing at the sink) and toileting (using standard toilet), during tx. Session focus was placed on dynamic standing balance, ADL retraining, safety awareness, functional cognition, and NMR. All functional transfers completed using SPC with close supervision assistance. He needs vcs to sit vs stand at appropriate times for safety when doffing and donning clothing. Vcs also for doffing gripper socks before donning regular socks and for threading belt into pants before putting pants on. Supervision for standing balance during shaving. At end of session pt remained seated in his recliner with all needs within reach and chair alarm set.   We also called up dtr Felicia to schedule family education with OT and SLP. She stated that she or her brother would be able to attend pts scheduled therapy sessions tomorrow.   Therapy Documentation Precautions:  Precautions Precautions: Fall Restrictions Weight Bearing Restrictions: No Vital Signs:  Pain:   ADL:       Therapy/Group: Individual Therapy  Laiza Veenstra A Jamarie Mussa 01/07/2020, 12:27 PM

## 2020-01-07 NOTE — Progress Notes (Signed)
PHYSICAL MEDICINE & REHABILITATION PROGRESS NOTE   Subjective/Complaints: Patient seen sitting up at the edge of his bed this morning eating breakfast.  Good sitting balance noted.  He states he slept well overnight.  He denies complaints.  ROS: Denies CP, SOB, N/V/D  Objective:   DG Swallowing Func-Speech Pathology  Result Date: 01/05/2020 Objective Swallowing Evaluation: Type of Study: MBS-Modified Barium Swallow Study  Patient Details Name: Brandon Perkins MRN: [id number] Date of Birth: 1951/07/22 Today's Date: 01/05/2020 1200-1230 30 minutes Past Medical History: Past Medical History: Diagnosis Date . Blood clot in vein   left arm vein blood clot, not know the detail, treated with ASA per pt . Cataract   OU . Hypertension  . Hypertensive retinopathy   OU Past Surgical History: No past surgical history on file. HPI: Pt is a 69 yo male presenting with R sided weakness with aphasia and slurred speech. 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 PMH: HTN, HTN retinopathy, GIB  Subjective: alert, pleasant Assessment / Plan / Recommendation CHL IP CLINICAL IMPRESSIONS 01/05/2020 Clinical Impression Pt presents with a mild pharyngoesophageal dysphagia.  Pt's swallow response was triggered at the pyriform sinuses which in combination with decreased anterior hyolaryngeal movement, decreased UES opening and concomitant residue at the pyriforms and CP segment lead to penetration with most consistencies.  Penetration remained above the level of the cords with the exception of several larger volume boluses which contacted the cords.  Material never passed below the vocal cords and pt was able to clear penetrates with cues for a volitional cough followed by a second swallow.  Penetration was not sensed in any occurrence although I suspect that had pt aspirated or penetrated larger volumes that a cough would have been triggered due to reports of coughing at bedside.   I  recommend that pt remain on his currently prescribed diet with ST follow up for use of esophageal swallowing precautions.  SLP initiated education regarding alternating solids and liquids, remaining upright for 30-60 minutes after meals, and using a throat clear followed by a second swallow every few bites and sips but pt will need reinforcement and family would benefit from education prior to discharge.   SLP Visit Diagnosis Dysphagia, pharyngoesophageal phase (R13.14) Attention and concentration deficit following -- Frontal lobe and executive function deficit following -- Impact on safety and function Mild aspiration risk   No flowsheet data found.  Prognosis 01/05/2020 Prognosis for Safe Diet Advancement Good Barriers to Reach Goals -- Barriers/Prognosis Comment -- CHL IP DIET RECOMMENDATION 01/05/2020 SLP Diet Recommendations Regular solids;Thin liquid Liquid Administration via Cup;Straw Medication Administration Whole meds with puree Compensations Minimize environmental distractions;Small sips/bites;Slow rate;Other (Comment);Follow solids with liquid;Clear throat intermittently Postural Changes Remain semi-upright after after feeds/meals (Comment);Seated upright at 90 degrees   CHL IP OTHER RECOMMENDATIONS 01/05/2020 Recommended Consults -- Oral Care Recommendations Oral care BID Other Recommendations --   CHL IP FOLLOW UP RECOMMENDATIONS 01/05/2020 Follow up Recommendations 24 hour supervision/assistance;Outpatient SLP;Home health SLP   CHL IP FREQUENCY AND DURATION 12/25/2019 Speech Therapy Frequency (ACUTE ONLY) min 2x/week Treatment Duration --      CHL IP ORAL PHASE 01/05/2020 Oral Phase WFL Oral - Pudding Teaspoon -- Oral - Pudding Cup -- Oral - Honey Teaspoon -- Oral - Honey Cup -- Oral - Nectar Teaspoon -- Oral - Nectar Cup -- Oral - Nectar Straw -- Oral - Thin Teaspoon -- Oral - Thin Cup -- Oral - Thin Straw -- Oral - Puree --  Oral - Mech Soft -- Oral - Regular -- Oral - Multi-Consistency -- Oral - Pill -- Oral  Phase - Comment --  CHL IP PHARYNGEAL PHASE 01/05/2020 Pharyngeal Phase Impaired Pharyngeal- Pudding Teaspoon -- Pharyngeal -- Pharyngeal- Pudding Cup -- Pharyngeal -- Pharyngeal- Honey Teaspoon -- Pharyngeal -- Pharyngeal- Honey Cup -- Pharyngeal -- Pharyngeal- Nectar Teaspoon -- Pharyngeal -- Pharyngeal- Nectar Cup -- Pharyngeal -- Pharyngeal- Nectar Straw -- Pharyngeal -- Pharyngeal- Thin Teaspoon -- Pharyngeal -- Pharyngeal- Thin Cup Delayed swallow initiation-pyriform sinuses;Reduced anterior laryngeal mobility;Reduced airway/laryngeal closure;Penetration/Aspiration during swallow;Pharyngeal residue - pyriform;Pharyngeal residue - cp segment Pharyngeal Material enters airway, remains ABOVE vocal cords and not ejected out Pharyngeal- Thin Straw Delayed swallow initiation-pyriform sinuses;Reduced anterior laryngeal mobility;Reduced airway/laryngeal closure;Pharyngeal residue - pyriform;Pharyngeal residue - cp segment;Penetration/Aspiration during swallow Pharyngeal Material enters airway, remains ABOVE vocal cords and not ejected out Pharyngeal- Puree Reduced anterior laryngeal mobility;Delayed swallow initiation-pyriform sinuses;Reduced airway/laryngeal closure;Pharyngeal residue - cp segment;Penetration/Aspiration during swallow;Pharyngeal residue - pyriform Pharyngeal Material enters airway, remains ABOVE vocal cords and not ejected out Pharyngeal- Mechanical Soft -- Pharyngeal -- Pharyngeal- Regular Delayed swallow initiation-pyriform sinuses;Reduced airway/laryngeal closure;Reduced anterior laryngeal mobility;Pharyngeal residue - pyriform;Pharyngeal residue - cp segment Pharyngeal -- Pharyngeal- Multi-consistency -- Pharyngeal -- Pharyngeal- Pill Delayed swallow initiation-pyriform sinuses;Reduced anterior laryngeal mobility;Pharyngeal residue - pyriform;Pharyngeal residue - cp segment;Penetration/Aspiration during swallow Pharyngeal Material enters airway, CONTACTS cords and not ejected out Pharyngeal Comment  --  CHL IP CERVICAL ESOPHAGEAL PHASE 01/05/2020 Cervical Esophageal Phase Impaired Pudding Teaspoon -- Pudding Cup -- Honey Teaspoon -- Honey Cup -- Nectar Teaspoon -- Nectar Cup -- Nectar Straw -- Thin Teaspoon -- Thin Cup Reduced cricopharyngeal relaxation Thin Straw Reduced cricopharyngeal relaxation Puree Reduced cricopharyngeal relaxation Mechanical Soft -- Regular Reduced cricopharyngeal relaxation Multi-consistency -- Pill Reduced cricopharyngeal relaxation Cervical Esophageal Comment -- Brandon Perkins 01/05/2020, 3:48 PM              No results for input(s): WBC, HGB, HCT, PLT in the last 72 hours. Recent Labs    01/05/20 0547  CREATININE 1.36*    Intake/Output Summary (Last 24 hours) at 01/07/2020 0748 Last data filed at 01/06/2020 1840 Gross per 24 hour  Intake 580 ml  Output --  Net 580 ml     Physical Exam: Vital Signs Blood pressure 126/65, pulse 68, temperature (!) 97.5 F (36.4 C), resp. rate 16, height 6\' 2"  (1.88 m), weight 75.2 kg, SpO2 100 %. Constitutional: No distress . Vital signs reviewed. HENT: Normocephalic.  Atraumatic. Eyes: EOMI. No discharge. Cardiovascular: No JVD. Respiratory: Normal effort.  No stridor. GI: Non-distended. Skin: Warm and dry.  Intact. Psych: Some delay. Musc: No edema in extremities.  No tenderness in extremities. Neuro: Alert LUE/LLE: 4+/5 proximal distal, unchanged  Assessment/Plan: 1. 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  Physiatrist is providing close team supervision and 24 hour management of active medical problems listed below.  Physiatrist and rehab team continue to assess barriers to discharge/monitor patient progress toward functional and medical goals  Care Tool:  Bathing    Body parts bathed by patient: Right arm, Left arm, Chest, Abdomen, Front perineal area, Buttocks, Right upper  leg, Left upper leg, Face, Left lower leg, Right lower leg         Bathing assist Assist Level: Contact Guard/Touching assist     Upper Body Dressing/Undressing Upper body dressing   What is the patient wearing?: Pull over  shirt    Upper body assist Assist Level: Supervision/Verbal cueing    Lower Body Dressing/Undressing Lower body dressing      What is the patient wearing?: Pants, Underwear/pull up     Lower body assist Assist for lower body dressing: Contact Guard/Touching assist     Toileting Toileting    Toileting assist Assist for toileting: Supervision/Verbal cueing Assistive Device Comment: cane   Transfers Chair/bed transfer  Transfers assist     Chair/bed transfer assist level: Supervision/Verbal cueing     Locomotion Ambulation   Ambulation assist      Assist level: Contact Guard/Touching assist Assistive device: Cane-straight Max distance: 100'   Walk 10 feet activity   Assist     Assist level: Contact Guard/Touching assist Assistive device: Cane-straight   Walk 50 feet activity   Assist    Assist level: Contact Guard/Touching assist Assistive device: Cane-straight    Walk 150 feet activity   Assist Walk 150 feet activity did not occur: Safety/medical concerns(fatigue, LE weakness, decreased postural control)  Assist level: Supervision/Verbal cueing Assistive device: Walker-rolling    Walk 10 feet on uneven surface  activity   Assist Walk 10 feet on uneven surfaces activity did not occur: Safety/medical concerns(fatigue, LE weakness, decreased postural control)         Wheelchair     Assist Will patient use wheelchair at discharge?: No Type of Wheelchair: Manual    Wheelchair assist level: Supervision/Verbal cueing Max wheelchair distance: 31ft    Wheelchair 50 feet with 2 turns activity    Assist        Assist Level: Supervision/Verbal cueing   Wheelchair 150 feet activity     Assist       Assist Level: Maximal Assistance - Patient 25 - 49%   Blood pressure 126/65, pulse 68, temperature (!) 97.5 F (36.4 C), resp. rate 16, height 6\' 2"  (1.88 m), weight 75.2 kg, SpO2 100 %.    Medical Problem List and Plan:  1. 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 Antithrombotics:  -DVT/anticoagulation: Lovenox  -antiplatelet therapy: Aspirin Plavix x3 months then aspirin alone  3. Pain Management: Tylenol as needed  Monitor for headaches.  4. Mood: Provide emotional support  -antipsychotic agents: Seroquel 12.5 mg twice daily, changed to nightly as needed 5. Neuropsych: This patient is ?fully capable of making decisions on his own behalf.  6. Skin/Wound Care: Routine skin checks  7. Fluids/Electrolytes/Nutrition: Routine in and outs.  8. Hypertension. Hygroton 25 mg daily, Norvasc 10 mg daily lisinopril 10 mg daily.   Controlled on 6/6  Monitor with increased mobility. Vitals:   01/06/20 2017 01/07/20 0649  BP: 136/82 126/65  Pulse: 92 68  Resp: 19 16  Temp: (!) 97.5 F (36.4 C) (!) 97.5 F (36.4 C)  SpO2: 100% 100%    9. Hyperlipidemia. Continue Lipitor 10. Acute renal impairment   Off hygroton and on lower dose Zestril, fluid intake poor  Creatinine 1.36 on 6/12, labs ordered for tomorrow 11.  Wet voice and cough   No clinical sign of pneumonia    SLP eval, on regular thins  Improved, no overt swallowing issues noticed  LOS: 9 days A FACE TO FACE EVALUATION WAS PERFORMED  Brandon Perkins 8/12 01/07/2020, 7:48 AM

## 2020-01-08 ENCOUNTER — Inpatient Hospital Stay (HOSPITAL_COMMUNITY): Payer: Medicare Other | Admitting: Occupational Therapy

## 2020-01-08 ENCOUNTER — Inpatient Hospital Stay (HOSPITAL_COMMUNITY): Payer: Medicare Other

## 2020-01-08 ENCOUNTER — Inpatient Hospital Stay (HOSPITAL_COMMUNITY): Payer: Medicare Other | Admitting: Physical Therapy

## 2020-01-08 LAB — BASIC METABOLIC PANEL
Anion gap: 11 (ref 5–15)
BUN: 38 mg/dL — ABNORMAL HIGH (ref 8–23)
CO2: 25 mmol/L (ref 22–32)
Calcium: 9.2 mg/dL (ref 8.9–10.3)
Chloride: 96 mmol/L — ABNORMAL LOW (ref 98–111)
Creatinine, Ser: 1.36 mg/dL — ABNORMAL HIGH (ref 0.61–1.24)
GFR calc Af Amer: >60 mL/min (ref >60)
GFR calc non Af Amer: 53 mL/min — ABNORMAL LOW (ref >60)
Glucose, Bld: 113 mg/dL — ABNORMAL HIGH (ref 70–99)
Potassium: 4 mmol/L (ref 3.5–5.1)
Sodium: 132 mmol/L — ABNORMAL LOW (ref 135–145)

## 2020-01-08 NOTE — Plan of Care (Signed)
  Problem: RH Balance Goal: LTG: Patient will maintain dynamic sitting balance (OT) Description: LTG:  Patient will maintain dynamic sitting balance with assistance during activities of daily living (OT) Outcome: Completed/Met Goal: LTG Patient will maintain dynamic standing with ADLs (OT) Description: LTG:  Patient will maintain dynamic standing balance with assist during activities of daily living (OT)  Outcome: Completed/Met   Problem: RH Grooming Goal: LTG Patient will perform grooming w/assist,cues/equip (OT) Description: LTG: Patient will perform grooming with assist, with/without cues using equipment (OT) Outcome: Completed/Met   Problem: RH Bathing Goal: LTG Patient will bathe all body parts with assist levels (OT) Description: LTG: Patient will bathe all body parts with assist levels (OT) Outcome: Completed/Met   Problem: RH Dressing Goal: LTG Patient will perform upper body dressing (OT) Description: LTG Patient will perform upper body dressing with assist, with/without cues (OT). Outcome: Completed/Met Goal: LTG Patient will perform lower body dressing w/assist (OT) Description: LTG: Patient will perform lower body dressing with assist, with/without cues in positioning using equipment (OT) Outcome: Completed/Met   Problem: RH Toileting Goal: LTG Patient will perform toileting task (3/3 steps) with assistance level (OT) Description: LTG: Patient will perform toileting task (3/3 steps) with assistance level (OT)  Outcome: Completed/Met   Problem: RH Toilet Transfers Goal: LTG Patient will perform toilet transfers w/assist (OT) Description: LTG: Patient will perform toilet transfers with assist, with/without cues using equipment (OT) Outcome: Completed/Met   Problem: RH Tub/Shower Transfers Goal: LTG Patient will perform tub/shower transfers w/assist (OT) Description: LTG: Patient will perform tub/shower transfers with assist, with/without cues using equipment  (OT) Outcome: Completed/Met   Problem: RH Memory Goal: LTG Patient will demonstrate ability for day to day recall/carry over during activities of daily living with assistance level (OT) Description: LTG:  Patient will demonstrate ability for day to day recall/carry over during activities of daily living with assistance level (OT). Outcome: Completed/Met   Problem: RH Attention Goal: LTG Patient will demonstrate this level of attention during functional activites (OT) Description: LTG:  Patient will demonstrate this level of attention during functional activites  (OT) Outcome: Completed/Met   Problem: RH Awareness Goal: LTG: Patient will demonstrate awareness during functional activites type of (OT) Description: LTG: Patient will demonstrate awareness during functional activites type of (OT) Outcome: Completed/Met

## 2020-01-08 NOTE — Progress Notes (Signed)
Occupational Therapy Discharge Summary  Patient Details  Name: Brandon Perkins MRN: [id number] Date of Birth: 02-08-1951  Today's Date: 01/08/2020 OT Individual Time: 4098-1191 and 4782-9562 OT Individual Time Calculation (min): 57 min and 25 min   Patient has met 12 of 12 long term goals due to improved activity tolerance, improved balance, postural control, functional use of  LEFT upper extremity, improved awareness and improved coordination.  Patient to discharge at overall Supervision level.  Patient's care partner is independent to provide the necessary physical and cognitive assistance at discharge.    Reasons goals not met: NA All goals met  Recommendation:  Patient will benefit from ongoing skilled OT services in home health setting to continue to advance functional skills in the area of BADL and iADL.  Equipment: TTB  Reasons for discharge: treatment goals met and discharge from [facility]  Patient/family agrees with progress made and goals achieved: Yes   Skilled Therapeutic Interventions: Pt greeted seated at EOB at time of session with son in room, stating he needed to use the bathroom. No c/o pain throughout session. Ambulated to the bathroom with supervision with Brandon Perkins and able to stand to void and perform clothing management. OT session focused on family ed/training in preparation for DC home with son and daughter to assist. Pt practiced TTB transfer with decreased willingness to comply to OT recommendations for safety, son education on purpose and safety tips. Son Brandon Perkins) educated throughout session on the patient requiring supervision assist at home for bathing, dressing, toileting, and assistance needed for IADLs for community mobility, meal preparation, etc. Son receptive but states one of his 2 sisters will most likely be assisting the pt and he will relay education to them. UB/LB bathing at sink level in dynamic standing with supervision assist all aspects, UB/LB dressing also  with supervision verbal cues only for compensatory techniques and energy conservation. Pt requires extended time to perform tasks. Pt up in chair at end of session with alarm on, call bell in reach, all needs met.  Session 2: Pt seated up in chair at session later in the day with daughter Brandon Perkins in the room for family ed. Pt eating his lunch, required supervision for cues for small bites and pacing. Family education with daughter for bathing, dressing, and toileting routines. Therapist demonstrated form for the patient to use TTB and perform bathing in a seated position when needed. Reviewed use of SPC for functional mobility during ADLs, dressing routines for safety, and need for family supervision throughout ADL tasks for safety cues and fall prevention. Daughter ed on assistance with IADLs of community mobility, driving, etc. Reviewed [facility] HEP for the patient to perform with theraputty to improve FMC, pinch, grip. Pt up in chair with alarm on, call bell in reach, all needs met.    OT Discharge Precautions/Restrictions  Precautions Precautions: Fall Precaution Comments: patient and family ed completed on fall prevention Pain Pain Assessment Pain Scale: 0-10 Pain Score: 0-No pain ADL ADL Grooming: Supervision/safety Where Assessed-Grooming: Sitting at sink Upper Body Bathing: Supervision/safety Where Assessed-Upper Body Bathing: Standing at sink Lower Body Bathing: Supervision/safety Where Assessed-Lower Body Bathing: Standing at sink Upper Body Dressing: Supervision/safety Where Assessed-Upper Body Dressing: Sitting at sink Lower Body Dressing: Supervision/safety Where Assessed-Lower Body Dressing: Standing at sink, Sitting at sink Toileting: Supervision/safety Where Assessed-Toileting: Glass blower/designer: Close supervision Toilet Transfer Method: Ambulating Tub/Shower Transfer: Close supervison Tub/Shower Transfer Method: Ambulating Tub/Shower Equipment: Risk manager Arousal/Alertness: Radio producer Motor: Hemiplegia Motor -  Skilled Clinical Observations: mild hemiparesis but improved from time of eval Mobility  Transfers Sit to Stand: Supervision/Verbal cueing Stand to Sit: Supervision/Verbal cueing  Trunk/Postural Assessment  Thoracic Assessment Thoracic Assessment: Exceptions to WFL(kyphotic posture) Lumbar Assessment Lumbar Assessment: Exceptions to WFL(posterior pelvic tilt)  Balance Balance Balance Assessed: Yes Dynamic Sitting Balance Dynamic Sitting - Balance Support: No upper extremity supported;Feet supported Dynamic Sitting - Level of Assistance: 5: Stand by assistance Dynamic Sitting - Balance Activities: Forward lean/weight shifting;Reaching for objects;Reaching across midline Sitting balance - Comments: donning LB clothing in sitting Dynamic Standing Balance Dynamic Standing - Balance Support: Right upper extremity supported Dynamic Standing - Level of Assistance: 5: Stand by assistance Dynamic Standing - Balance Activities: Lateral lean/weight shifting;Forward lean/weight shifting;Reaching across midline Dynamic Standing - Comments: dynamic standing reaching for personal items during bathing/dressing Extremity/Trunk Assessment RUE Assessment RUE Assessment: Within Functional Limits LUE Assessment LUE Assessment: Exceptions to [facility] General Strength Comments: generalized weakness/decreased coordination but improved from time of eval LUE Body System: Neuro   Viona Gilmore 01/08/2020, 10:53 AM

## 2020-01-08 NOTE — Progress Notes (Signed)
Physical Therapy Session Note  Patient Details  Name: Brandon Perkins MRN: [id number] Date of Birth: 09-17-1950  Today's Date: 01/08/2020 PT Individual Time: 1415-1515 PT Individual Time Calculation (min): 60 min   Short Term Goals: Week 1:  PT Short Term Goal 1 (Week 1): STG=LTG due to LOS   Skilled Therapeutic Interventions/Progress Updates: Pt presented in recliner agreeable to therapy. Pt denies pain during session. Session focused on functional mobility in preparation for d/c. Pt donned shoes with set up and increased time. Pt ambulated throughout unit distances for 100-238f with SPC and supervision however noted with increased fatigue pt with decreased L foot clearance and intermittent foot catching on floor. Pt participated in car transfer, ambulation on mulch, stairs x 12 with B rails, and use of rebounder for dynamic balance all performed with varying levels of CGA to mod I (see navigator). Pt also participated in [place]  Pt also participated in bed mobility in ADL with mod I from standard bed. PTA reinforced safety with energy conservation particularly with fatigue when noted decreasing foot clearance when ambulating back to room. At end of session pt returned to recliner and left with seat alarm on, call bell within reach and needs met.      Therapy Documentation Precautions:  Precautions Precautions: Fall Precaution Comments: patient and family ed completed on fall prevention Restrictions Weight Bearing Restrictions: No General:   Vital Signs: Therapy Vitals Temp: 98.4 F (36.9 C) Temp Source: Oral Pulse Rate: 94 Resp: 18 BP: 109/89 Patient Position (if appropriate): Sitting Oxygen Therapy SpO2: 99 % O2 Device: Room Air Pain: Pain Assessment Pain Score: 0-No pain Mobility: Bed Mobility Bed Mobility: Rolling Right;Rolling Left;Sit to Supine;Supine to Sit Rolling Right: Independent Rolling Left: Independent Supine to Sit: Independent with assistive device Sit to  Supine: Independent with assistive device Transfers Transfers: Sit to Stand;Stand to Sit Sit to Stand: Independent with assistive device Stand to Sit: Independent with assistive device Transfer (Assistive device): Straight cane Locomotion : Gait Ambulation: Yes Gait Assistance: Supervision/Verbal cueing Gait Distance (Feet): 150 Feet Assistive device: Straight cane Gait Gait: Yes Gait Pattern: Impaired Gait Pattern: Decreased step length - right;Decreased step length - left;Decreased stride length;Decreased dorsiflexion - right;Decreased dorsiflexion - left;Poor foot clearance - left;Poor foot clearance - right;Narrow base of support Stairs / Additional Locomotion Stairs: Yes Stairs Assistance: Supervision/Verbal cueing Stair Management Technique: Two rails Number of Stairs: 12 Height of Stairs: 6 Wheelchair Mobility Wheelchair Mobility: No  Trunk/Postural Assessment : Cervical Assessment Cervical Assessment: Exceptions to WFL(forward head) Thoracic Assessment Thoracic Assessment: Exceptions to WFL(rounded shoulders) Lumbar Assessment Lumbar Assessment: Exceptions to WFL(posterior pelvic tilt) Postural Control Postural Control: Deficits on evaluation  Balance: Balance Balance Assessed: Yes Static Sitting Balance Static Sitting - Balance Support: No upper extremity supported;Feet supported Static Sitting - Level of Assistance: 6: Modified independent (Device/Increase time) Dynamic Sitting Balance Dynamic Sitting - Level of Assistance: 6: Modified independent (Device/Increase time) Dynamic Sitting - Balance Activities: Ball toss;Reaching for objects Static Standing Balance Static Standing - Balance Support: No upper extremity supported Static Standing - Level of Assistance: 6: Modified independent (Device/Increase time) Dynamic Standing Balance Dynamic Standing - Balance Support: No upper extremity supported Dynamic Standing - Level of Assistance: 6: Modified independent  (Device/Increase time) Dynamic Standing - Balance Activities: Reaching for objects;Reaching across midline Dynamic Standing - Comments: use of rebounder forward and with trunk rotation Exercises:   Other Treatments:      Therapy/Group: Individual Therapy  Arne Schlender 01/08/2020, 4:08 PM

## 2020-01-08 NOTE — Progress Notes (Signed)
[facility] PHYSICAL MEDICINE & REHABILITATION PROGRESS NOTE   Subjective/Complaints:   No issues overnite , reviewed MBS with pt and son.  Advise small sips of liquid   ROS: Denies CP, SOB, N/V/D  Objective:   No results found. No results for input(s): WBC, HGB, HCT, PLT in the last 72 hours. No results for input(s): NA, K, CL, CO2, GLUCOSE, BUN, CREATININE, CALCIUM in the last 72 hours.  Intake/Output Summary (Last 24 hours) at 01/08/2020 0751 Last data filed at 01/08/2020 0437 Gross per 24 hour  Intake 820 ml  Output 500 ml  Net 320 ml     Physical Exam: Vital Signs Blood pressure 123/60, pulse 60, temperature 98.7 F (37.1 C), temperature source Oral, resp. rate 18, height 6\' 2"  (1.88 m), weight 75.2 kg, SpO2 95 %.  General: No acute distress Mood and affect are appropriate Heart: Regular rate and rhythm no rubs murmurs or extra sounds Lungs: Clear to auscultation, breathing unlabored, no rales or wheezes Abdomen: Positive bowel sounds, soft nontender to palpation, nondistended Extremities: No clubbing, cyanosis, or edema Skin: No evidence of breakdown, no evidence of rash  LUE/LLE: 4+/5 proximal distal, unchanged  Assessment/Plan: 1. 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  Physiatrist is providing close team supervision and 24 hour management of active medical problems listed below.  Physiatrist and rehab team continue to assess barriers to discharge/monitor patient progress toward functional and medical goals  Care Tool:  Bathing    Body parts bathed by patient: Right arm, Left arm, Chest, Abdomen, Front perineal area, Buttocks, Right upper leg, Left upper leg, Face, Left lower leg, Right lower leg         Bathing assist Assist Level: Contact Guard/Touching assist     Upper Body Dressing/Undressing Upper body dressing    What is the patient wearing?: Pull over shirt    Upper body assist Assist Level: Supervision/Verbal cueing    Lower Body Dressing/Undressing Lower body dressing      What is the patient wearing?: Pants, Underwear/pull up     Lower body assist Assist for lower body dressing: Contact Guard/Touching assist     Toileting Toileting    Toileting assist Assist for toileting: Supervision/Verbal cueing Assistive Device Comment: cane   Transfers Chair/bed transfer  Transfers assist     Chair/bed transfer assist level: Supervision/Verbal cueing     Locomotion Ambulation   Ambulation assist      Assist level: Contact Guard/Touching assist Assistive device: Cane-straight Max distance: 100'   Walk 10 feet activity   Assist     Assist level: Contact Guard/Touching assist Assistive device: Cane-straight   Walk 50 feet activity   Assist    Assist level: Contact Guard/Touching assist Assistive device: Cane-straight    Walk 150 feet activity   Assist Walk 150 feet activity did not occur: Safety/medical concerns(fatigue, LE weakness, decreased postural control)  Assist level: Supervision/Verbal cueing Assistive device: Walker-rolling    Walk 10 feet on uneven surface  activity   Assist Walk 10 feet on uneven surfaces activity did not occur: Safety/medical concerns(fatigue, LE weakness, decreased postural control)         Wheelchair     Assist Will patient use wheelchair at discharge?: No Type of Wheelchair: Manual    Wheelchair assist level: Supervision/Verbal cueing Max wheelchair distance: 39ft    Wheelchair 50 feet with 2 turns activity  Assist        Assist Level: Supervision/Verbal cueing   Wheelchair 150 feet activity     Assist      Assist Level: Maximal Assistance - Patient 25 - 49%   Blood pressure 123/60, pulse 60, temperature 98.7 F (37.1 C), temperature source Oral, resp. rate 18, height 6\' 2"  (1.88 m),  weight 75.2 kg, SpO2 95 %.    Medical Problem List and Plan:  1. 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 Antithrombotics:  -DVT/anticoagulation: Lovenox  -antiplatelet therapy: Aspirin Plavix x3 months then aspirin alone  3. Pain Management: Tylenol as needed  Monitor for headaches.  4. Mood: Provide emotional support  -antipsychotic agents: Seroquel 12.5 mg twice daily, changed to nightly as needed 5. Neuropsych: This patient is ?fully capable of making decisions on his own behalf.  6. Skin/Wound Care: Routine skin checks  7. Fluids/Electrolytes/Nutrition: Routine in and outs.  8. Hypertension. Hygroton 25 mg daily, Norvasc 10 mg daily lisinopril 10 mg daily.   Controlled on 6/7  Monitor with increased mobility. Vitals:   01/07/20 1934 01/08/20 0433  BP: (!) 121/59 123/60  Pulse: 61 60  Resp: 18 18  Temp: 97.8 F (36.6 C) 98.7 F (37.1 C)  SpO2: 95%     9. Hyperlipidemia. Continue Lipitor 10. Acute renal impairment   Off hygroton and on lower dose Zestril, fluid intake poor  Creatinine 1.36 on 6/12, labs ordered for tomorrow 11.  Wet voice and cough - cont thin liquid small sips    LOS: 10 days A FACE TO FACE EVALUATION WAS PERFORMED  8/12 01/08/2020, 7:51 AM

## 2020-01-08 NOTE — Progress Notes (Signed)
Physical Therapy Discharge Summary  Patient Details  Name: Brandon Perkins MRN: [id number] Date of Birth: [birth year]  Today's Date: 01/09/2020      Patient has met 9 of 9 long term goals due to improved activity tolerance, improved balance, improved postural control, increased strength, improved attention, improved awareness and improved coordination.  Patient to discharge at an ambulatory level Modified Independent for transfers and Supervision for ambulation with LR[facility]).   Patient's care partner is independent to provide the necessary physical assistance at discharge.  Reasons goals not met: N/A all goals met  Recommendation:  Patient will benefit from ongoing skilled PT services in home health setting to continue to advance safe functional mobility, address ongoing impairments in balance, strength, safety, and minimize fall risk.  Equipment: No equipment provided  Reasons for discharge: treatment goals met  Patient/family agrees with progress made and goals achieved: Yes  PT Discharge Precautions/Restrictions Precautions Precautions: Fall Restrictions Weight Bearing Restrictions: No Vital Signs Therapy Vitals Temp: 98.4 F (36.9 C) Temp Source: Oral Pulse Rate: 94 Resp: 18 BP: 109/89 Patient Position (if appropriate): Sitting Oxygen Therapy SpO2: 99 % O2 Device: Room Air Pain Pain Assessment Pain Score: 0-No pain  Cognition Overall Cognitive Status: Impaired/Different from baseline Arousal/Alertness: Awake/alert Orientation Level: Oriented X4 Attention: Selective Selective Attention: Impaired Selective Attention Impairment: Verbal basic Memory: Impaired Awareness: Impaired Awareness Impairment: Intellectual impairment;Emergent impairment Problem Solving: Impaired Problem Solving Impairment: Functional complex;Verbal complex Safety/Judgment: Impaired Sensation Sensation Light Touch: Appears Intact Motor  Motor Motor: Hemiplegia Motor - Discharge  Observations: mild hemi but improved from eval  Mobility Bed Mobility Bed Mobility: Rolling Right;Rolling Left;Sit to Supine;Supine to Sit Rolling Right: Independent Rolling Left: Independent Supine to Sit: Independent with assistive device Sit to Supine: Independent with assistive device Transfers Transfers: Sit to Stand;Stand to Sit Sit to Stand: Independent with assistive device Stand to Sit: Independent with assistive device Transfer (Assistive device): Straight cane Locomotion  Gait Ambulation: Yes Gait Assistance: Supervision/Verbal cueing Gait Distance (Feet): 150 Feet Assistive device: Straight cane Gait Gait: Yes Gait Pattern: Impaired Gait Pattern: Decreased step length - right;Decreased step length - left;Decreased stride length;Decreased dorsiflexion - right;Decreased dorsiflexion - left;Poor foot clearance - left;Poor foot clearance - right;Narrow base of support Stairs / Additional Locomotion Stairs: Yes Stairs Assistance: Supervision/Verbal cueing Stair Management Technique: Two rails Number of Stairs: 12 Height of Stairs: 6 Wheelchair Mobility Wheelchair Mobility: No  Trunk/Postural Assessment  Cervical Assessment Cervical Assessment: Exceptions to WFL(forward head) Thoracic Assessment Thoracic Assessment: Exceptions to WFL(rounded shoulders) Lumbar Assessment Lumbar Assessment: Exceptions to WFL(posterior pelvic tilt) Postural Control Postural Control: Deficits on evaluation  Balance Balance Balance Assessed: Yes Static Sitting Balance Static Sitting - Balance Support: No upper extremity supported;Feet supported Static Sitting - Level of Assistance: 6: Modified independent (Device/Increase time) Dynamic Sitting Balance Dynamic Sitting - Level of Assistance: 6: Modified independent (Device/Increase time) Dynamic Sitting - Balance Activities: Ball toss;Reaching for objects Static Standing Balance Static Standing - Balance Support: No upper extremity  supported Static Standing - Level of Assistance: 6: Modified independent (Device/Increase time) Dynamic Standing Balance Dynamic Standing - Balance Support: No upper extremity supported Dynamic Standing - Level of Assistance: 6: Modified independent (Device/Increase time) Dynamic Standing - Balance Activities: Reaching for objects;Reaching across midline Dynamic Standing - Comments: use of rebounder forward and with trunk rotation Extremity Assessment  RUE Assessment RUE Assessment: Within Functional Limits LUE Assessment LUE Assessment: Exceptions to [facility] General Strength Comments: generalized weakness/decreased coordination but improved from time of eval LUE  Body System: Neuro RLE Assessment RLE Assessment: Within Functional Limits General Strength Comments: grossly 4/5 LLE Assessment LLE Assessment: Within Functional Limits General Strength Comments: grossly 4/5    Rosita DeChalus 01/08/2020, 4:08 PM    Barrie Folk PT, DPT  12:58 PM 01/10/20

## 2020-01-08 NOTE — Discharge Summary (Signed)
Physician Discharge Summary  Patient ID: Brandon Perkins MRN: [id number] DOB/AGE: 67/08/1950 67 y.o.  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 EXAM: CT ANGIOGRAPHY HEAD AND NECK TECHNIQUE: Multidetector CT imaging of the head and neck was performed using the standard protocol during bolus administration of intravenous contrast. Multiplanar CT image reconstructions and MIPs were obtained to evaluate the vascular anatomy. Carotid stenosis measurements (when applicable) are obtained utilizing NASCET criteria, using the distal internal carotid diameter as the denominator. CONTRAST:  75mL OMNIPAQUE IOHEXOL 350 MG/ML SOLN COMPARISON:  Head CT and MRI earlier today. FINDINGS: CTA NECK Skeleton: Advanced cervical spine disc and endplate degeneration with reversal of lordosis. Carious posterior right maxillary dentition. No acute osseous abnormality identified. Upper chest: Negative. Other neck: No acute findings. Aortic arch: 3 vessel arch configuration with mild for age arch atherosclerosis. Right carotid system: Tortuous brachiocephalic artery without plaque or stenosis. Normal proximal right CCA. There is low-density soft plaque within the medial right CCA at the level of the thyroid cartilage on series 5, image 126, but no significant stenosis. Capacious right carotid bifurcation with minimal calcified plaque of right bulb. Tortuous right ICA just below the skull base without stenosis. Left carotid system: Subtle soft  plaque in the left CCA which is mildly tortuous. Mild calcified plaque in the posterior left ICA bulb without stenosis. Similar left ICA tortuosity at C1-C2. Vertebral arteries: Mildly tortuous proximal right subclavian artery without plaque or stenosis. Normal right vertebral artery origin. The right vertebral is patent to the skull base without plaque or stenosis. No proximal left subclavian artery plaque or stenosis. Normal left vertebral artery origin. Codominant left vertebral is patent to the skull base without plaque or stenosis. CTA HEAD Posterior circulation: Codominant V4 segments with minimal calcified plaque. No stenosis on the left. There is mild right V4 segment stenosis (series 7, image 145) which may be related to soft plaque. Both PICA origins remain patent. Patent vertebrobasilar junction. Patent and normal proximal and mid basilar artery. However, the distal basilar tapers, with patent but irregular basilar tip, highly irregular bilateral SCA and PCA origins (series 11, image 22). There is at least partial patency of the right SCA visible on series 8, image 102, but all 4 basilar artery branches in this region are highly irregular. The P2 and P3 segments appear more normal, although remain moderately irregular. No PCA branch occlusion is identified. Anterior circulation: Both ICA siphons are patent with mild ectasia, minimal calcified plaque and no stenosis. Normal ophthalmic artery origins. Posterior communicating arteries are diminutive or absent. Patent carotid termini but severe irregularity and stenosis at both ACA origins rim anise int of that at the basilar tip (series 11, image 19). Both MCA origins are more normal. Bilateral ACAs are patent but diminutive and irregular. There is moderate to severe irregularity and stenosis of mid and distal A2 segments (series 12, image 20). Mildly irregular left MCA M1 without stenosis. Patent left MCA bifurcation. Mildly irregular left MCA branches.  Tapered and irregular right MCA M1 which becomes moderately irregular and stenotic at the right MCA  bifurcation (series 11, image 17). However, the major right MCA branches remain patent although moderately irregular throughout (series 12, image 13). Venous sinuses: Early contrast timing, not well evaluated. Anatomic variants: None. Review of the MIP images confirms the above findings IMPRESSION: 1. 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 2. But negative for any complete occlusion; and the Right SCA remains at least partially patent. 3. Also widespread irregularity and stenoses of the second order intracranial branches, including moderate stenosis at the Right MCA bifurcation. 4. The above is favored due to advanced intracranial atherosclerosis, despite only minimal/mild atherosclerosis in the neck. There is no significant carotid or vertebral artery stenosis. Salient findings were communicated to Dr. Otelia Limes at 8:16 pm on 12/24/2019 by text page via the [facility] messaging system. Electronically Signed   By: Odessa Fleming M.D.   On: 12/24/2019 20:16   CT Head Wo Contrast  Result Date: 12/24/2019 CLINICAL DATA:  Focal neuro deficit.  Aphasia.  Stroke suspected. EXAM: CT HEAD WITHOUT CONTRAST TECHNIQUE: Contiguous axial images were obtained from the base of the skull through the vertex without intravenous contrast. COMPARISON:  None. FINDINGS: Brain: There is no evidence of acute intracranial hemorrhage, mass lesion, brain edema or extra-axial fluid collection. There is atrophy with diffuse prominence of the ventricles and subarachnoid spaces. Cavum septum pellucidum and vergae noted incidentally. Multifocal encephalomalacia is present, most notably in the left occipital lobe and in both cerebellar hemispheres. The right cerebellar component is slightly less distinct and could be a late subacute infarct. Old lacunar  infarcts are present within the right lentiform nuclei and pons. There are chronic small vessel ischemic changes in the periventricular white matter. There is no CT evidence of acute cortical infarction. Vascular: Intracranial vascular calcifications. No hyperdense vessel identified. Skull: Negative for fracture or suspicious focal lesion. Sinuses/Orbits: Left maxillary sinus polyp or mucous retention cysts. The visualized paranasal sinuses, mastoid air cells and middle ears are otherwise clear. No significant orbital findings. Other: None. IMPRESSION: 1. No CT evidence of acute cortical infarction or hemorrhage. 2. Multifocal encephalomalacia as described. Right cerebellar component could reflect a late subacute infarct. 3. Atrophy and chronic small vessel ischemic changes. Electronically Signed   By: Carey Bullocks M.D.   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 EXAM: CT ANGIOGRAPHY HEAD AND NECK TECHNIQUE: Multidetector CT imaging of the head and neck was performed using the standard protocol during bolus administration of intravenous contrast. Multiplanar CT image reconstructions and MIPs were obtained to evaluate the vascular anatomy. Carotid stenosis measurements (when applicable) are obtained utilizing NASCET criteria, using the distal internal carotid diameter as the denominator. CONTRAST:  75mL OMNIPAQUE IOHEXOL 350 MG/ML SOLN COMPARISON:  Head CT and MRI earlier today. FINDINGS: CTA NECK Skeleton: Advanced cervical spine disc and endplate degeneration with reversal of lordosis. Carious posterior right maxillary dentition. No acute osseous abnormality identified. Upper chest: Negative. Other neck: No acute findings. Aortic arch: 3 vessel arch configuration with mild for age arch atherosclerosis. Right carotid system: Tortuous  brachiocephalic artery without plaque or stenosis. Normal proximal right CCA. There is low-density soft plaque within the medial right CCA at the level of the thyroid cartilage on series 5, image 126, but no significant stenosis. Capacious right  carotid bifurcation with minimal calcified plaque of right bulb. Tortuous right ICA just below the skull base without stenosis. Left carotid system: Subtle soft plaque in the left CCA which is mildly tortuous. Mild calcified plaque in the posterior left ICA bulb without stenosis. Similar left ICA tortuosity at C1-C2. Vertebral arteries: Mildly tortuous proximal right subclavian artery without plaque or stenosis. Normal right vertebral artery origin. The right vertebral is patent to the skull base without plaque or stenosis. No proximal left subclavian artery plaque or stenosis. Normal left vertebral artery origin. Codominant left vertebral is patent to the skull base without plaque or stenosis. CTA HEAD Posterior circulation: Codominant V4 segments with minimal calcified plaque. No stenosis on the left. There is mild right V4 segment stenosis (series 7, image 145) which may be related to soft plaque. Both PICA origins remain patent. Patent vertebrobasilar junction. Patent and normal proximal and mid basilar artery. However, the distal basilar tapers, with patent but irregular basilar tip, highly irregular bilateral SCA and PCA origins (series 11, image 22). There is at least partial patency of the right SCA visible on series 8, image 102, but all 4 basilar artery branches in this region are highly irregular. The P2 and P3 segments appear more normal, although remain moderately irregular. No PCA branch occlusion is identified. Anterior circulation: Both ICA siphons are patent with mild ectasia, minimal calcified plaque and no stenosis. Normal ophthalmic artery origins. Posterior communicating arteries are diminutive or absent. Patent carotid termini but severe irregularity  and stenosis at both ACA origins rim anise int of that at the basilar tip (series 11, image 19). Both MCA origins are more normal. Bilateral ACAs are patent but diminutive and irregular. There is moderate to severe irregularity and stenosis of mid and distal A2 segments (series 12, image 20). Mildly irregular left MCA M1 without stenosis. Patent left MCA bifurcation. Mildly irregular left MCA branches. Tapered and irregular right MCA M1 which becomes moderately irregular and stenotic at the right MCA bifurcation (series 11, image 17). However, the major right MCA branches remain patent although moderately irregular throughout (series 12, image 13). Venous sinuses: Early contrast timing, not well evaluated. Anatomic variants: None. Review of the MIP images confirms the above findings IMPRESSION: 1. 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 2. But negative for any complete occlusion; and the Right SCA remains at least partially patent. 3. Also widespread irregularity and stenoses of the second order intracranial branches, including moderate stenosis at the Right MCA bifurcation. 4. The above is favored due to advanced intracranial atherosclerosis, despite only minimal/mild atherosclerosis in the neck. There is no significant carotid or vertebral artery stenosis. Salient findings were communicated to Dr. Otelia Limes at 8:16 pm on 12/24/2019 by text page via the [facility] messaging system. Electronically Signed   By: Odessa Fleming M.D.   On: 12/24/2019 20:16   MR BRAIN WO CONTRAST  Result Date: 12/24/2019 CLINICAL DATA:  69 year old male with aphasia, neurologic deficit. EXAM: MRI HEAD WITHOUT CONTRAST TECHNIQUE: Multiplanar, multiecho pulse sequences of the brain and surrounding structures were obtained without intravenous contrast. COMPARISON:  Head CT 1255 hours today. FINDINGS: Brain: Patchy and confluent restricted  diffusion in the right superior cerebellar artery territory (series 11, image 50). Associated T2 and FLAIR hyperintensity, T1 hypointensity, no evidence of acute hemorrhage. No posterior fossa mass effect. Superimposed chronic infarcts in the contralateral left cerebellum, bilateral pons, bilateral deep gray  nuclei, bilateral corona radiata. Chronic cortical encephalomalacia in the left parietal and occipital lobes. Occasional chronic micro hemorrhages including in the left brainstem. No midline shift, mass effect, evidence of mass lesion, ventriculomegaly, extra-axial collection or acute intracranial hemorrhage. Cervicomedullary junction and pituitary are within normal limits. Cavum septum pellucidum (normal variant). Vascular: Major intracranial vascular flow voids are preserved. Generalized intracranial artery tortuosity. Skull and upper cervical spine: Partially visible cervical spine degeneration with evidence of mild degenerative spinal stenosis at C3-C4 (series 13, image 13). Visualized bone marrow signal is within normal limits. Sinuses/Orbits: Negative orbits. Mild maxillary sinus mucous retention cysts. Other: Mastoids are clear. Visible internal auditory structures appear normal. Scalp and face soft tissues appear negative. IMPRESSION: 1. 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 No associated hemorrhage or mass effect. 2. Underlying advanced chronic ischemic disease. 3. Partially visible cervical spine degeneration with mild spinal stenosis at C3-C4. Electronically Signed   By: Genevie Ann M.D.   On: 12/24/2019 16:58   DG Chest Portable 1 View  Result Date: 12/24/2019 CLINICAL DATA:  Hypertension.  Slurred speech. EXAM: PORTABLE CHEST 1 VIEW COMPARISON:  None. FINDINGS: Assessment heart size is limited due to portable technique. The heart may be borderline to mildly enlarged. The hila, mediastinum, lungs, and pleura are  otherwise unremarkable. IMPRESSION: No acute abnormalities. Electronically Signed   By: Dorise Bullion III M.D   On: 12/24/2019 13:29   DG Swallowing Func-Speech Pathology  Result Date: 01/05/2020 Objective Swallowing Evaluation: Type of Study: MBS-Modified Barium Swallow Study  Patient Details Name: LY BACCHI MRN: [id number] Date of Birth: Apr 11, 1951 Today's Date: 01/05/2020 1200-1230 30 minutes Past Medical History: Past Medical History: Diagnosis Date . Blood clot in vein   left arm vein blood clot, not know the detail, treated with ASA per pt . Cataract   OU . Hypertension  . Hypertensive retinopathy   OU Past Surgical History: No past surgical history on file. HPI: Pt is a 69 yo male presenting with R sided weakness with aphasia and slurred speech. 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 PMH: HTN, HTN retinopathy, GIB  Subjective: alert, pleasant Assessment / Plan / Recommendation CHL IP CLINICAL IMPRESSIONS 01/05/2020 Clinical Impression Pt presents with a mild pharyngoesophageal dysphagia.  Pt's swallow response was triggered at the pyriform sinuses which in combination with decreased anterior hyolaryngeal movement, decreased UES opening and concomitant residue at the pyriforms and CP segment lead to penetration with most consistencies.  Penetration remained above the level of the cords with the exception of several larger volume boluses which contacted the cords.  Material never passed below the vocal cords and pt was able to clear penetrates with cues for a volitional cough followed by a second swallow.  Penetration was not sensed in any occurrence although I suspect that had pt aspirated or penetrated larger volumes that a cough would have been triggered due to reports of coughing at bedside.   I recommend that pt remain on his currently prescribed diet with ST follow up for use of esophageal swallowing precautions.  SLP initiated education regarding  alternating solids and liquids, remaining upright for 30-60 minutes after meals, and using a throat clear followed by a second swallow every few bites and sips but pt will need reinforcement and family would benefit from education prior to discharge.   SLP Visit Diagnosis Dysphagia, pharyngoesophageal phase (R13.14) Attention and concentration deficit following -- Frontal lobe and  executive function deficit following -- Impact on safety and function Mild aspiration risk   No flowsheet data found.  Prognosis 01/05/2020 Prognosis for Safe Diet Advancement Good Barriers to Reach Goals -- Barriers/Prognosis Comment -- CHL IP DIET RECOMMENDATION 01/05/2020 SLP Diet Recommendations Regular solids;Thin liquid Liquid Administration via Cup;Straw Medication Administration Whole meds with puree Compensations Minimize environmental distractions;Small sips/bites;Slow rate;Other (Comment);Follow solids with liquid;Clear throat intermittently Postural Changes Remain semi-upright after after feeds/meals (Comment);Seated upright at 90 degrees   CHL IP OTHER RECOMMENDATIONS 01/05/2020 Recommended Consults -- Oral Care Recommendations Oral care BID Other Recommendations --   CHL IP FOLLOW UP RECOMMENDATIONS 01/05/2020 Follow up Recommendations 24 hour supervision/assistance;Outpatient SLP;Home health SLP   CHL IP FREQUENCY AND DURATION 12/25/2019 Speech Therapy Frequency (ACUTE ONLY) min 2x/week Treatment Duration --      CHL IP ORAL PHASE 01/05/2020 Oral Phase WFL Oral - Pudding Teaspoon -- Oral - Pudding Cup -- Oral - Honey Teaspoon -- Oral - Honey Cup -- Oral - Nectar Teaspoon -- Oral - Nectar Cup -- Oral - Nectar Straw -- Oral - Thin Teaspoon -- Oral - Thin Cup -- Oral - Thin Straw -- Oral - Puree -- Oral - Mech Soft -- Oral - Regular -- Oral - Multi-Consistency -- Oral - Pill -- Oral Phase - Comment --  CHL IP PHARYNGEAL PHASE 01/05/2020 Pharyngeal Phase Impaired Pharyngeal- Pudding Teaspoon -- Pharyngeal -- Pharyngeal- Pudding Cup --  Pharyngeal -- Pharyngeal- Honey Teaspoon -- Pharyngeal -- Pharyngeal- Honey Cup -- Pharyngeal -- Pharyngeal- Nectar Teaspoon -- Pharyngeal -- Pharyngeal- Nectar Cup -- Pharyngeal -- Pharyngeal- Nectar Straw -- Pharyngeal -- Pharyngeal- Thin Teaspoon -- Pharyngeal -- Pharyngeal- Thin Cup Delayed swallow initiation-pyriform sinuses;Reduced anterior laryngeal mobility;Reduced airway/laryngeal closure;Penetration/Aspiration during swallow;Pharyngeal residue - pyriform;Pharyngeal residue - cp segment Pharyngeal Material enters airway, remains ABOVE vocal cords and not ejected out Pharyngeal- Thin Straw Delayed swallow initiation-pyriform sinuses;Reduced anterior laryngeal mobility;Reduced airway/laryngeal closure;Pharyngeal residue - pyriform;Pharyngeal residue - cp segment;Penetration/Aspiration during swallow Pharyngeal Material enters airway, remains ABOVE vocal cords and not ejected out Pharyngeal- Puree Reduced anterior laryngeal mobility;Delayed swallow initiation-pyriform sinuses;Reduced airway/laryngeal closure;Pharyngeal residue - cp segment;Penetration/Aspiration during swallow;Pharyngeal residue - pyriform Pharyngeal Material enters airway, remains ABOVE vocal cords and not ejected out Pharyngeal- Mechanical Soft -- Pharyngeal -- Pharyngeal- Regular Delayed swallow initiation-pyriform sinuses;Reduced airway/laryngeal closure;Reduced anterior laryngeal mobility;Pharyngeal residue - pyriform;Pharyngeal residue - cp segment Pharyngeal -- Pharyngeal- Multi-consistency -- Pharyngeal -- Pharyngeal- Pill Delayed swallow initiation-pyriform sinuses;Reduced anterior laryngeal mobility;Pharyngeal residue - pyriform;Pharyngeal residue - cp segment;Penetration/Aspiration during swallow Pharyngeal Material enters airway, CONTACTS cords and not ejected out Pharyngeal Comment --  CHL IP CERVICAL ESOPHAGEAL PHASE 01/05/2020 Cervical Esophageal Phase Impaired Pudding Teaspoon -- Pudding Cup -- Honey Teaspoon -- Honey Cup --  Nectar Teaspoon -- Nectar Cup -- Nectar Straw -- Thin Teaspoon -- Thin Cup Reduced cricopharyngeal relaxation Thin Straw Reduced cricopharyngeal relaxation Puree Reduced cricopharyngeal relaxation Mechanical Soft -- Regular Reduced cricopharyngeal relaxation Multi-consistency -- Pill Reduced cricopharyngeal relaxation Cervical Esophageal Comment -- Maryjane Hurter 01/05/2020, 3:48 PM              ECHOCARDIOGRAM COMPLETE  Result Date: 12/26/2019    ECHOCARDIOGRAM REPORT   Patient Name:   BUDDY LOEFFELHOLZ Date of Exam: 12/26/2019 Medical Rec #:  [id number]      Height:       74.0 in Accession #:    [id number]     Weight:       173.3 lb Date of Birth:  05-09-1951  BSA:          2.045 m Patient Age:    69 years       BP:           162/88 mmHg Patient Gender: M              HR:           66 bpm. Exam Location:  Inpatient Procedure: 2D Echo, Cardiac Doppler and Color Doppler Indications:    Stroke  History:        Patient has prior history of Echocardiogram examinations, most                 recent 11/08/2014. Risk Factors:Hypertension.  Sonographer:    Ross Ludwig RDCS (AE) Referring Phys: [id number] ASHISH ARORA IMPRESSIONS  1. Left ventricular ejection fraction, by estimation, is 55 to 60%. The left ventricle has normal function. The left ventricle has no regional wall motion abnormalities. There is severe left ventricular hypertrophy. Left ventricular diastolic parameters  are consistent with Grade I diastolic dysfunction (impaired relaxation). Consider cardiac amyloidosis.  2. Right ventricular systolic function is normal. The right ventricular size is normal.  3. The mitral valve is normal in structure. No evidence of mitral valve regurgitation. No evidence of mitral stenosis.  4. The aortic valve is tricuspid. Aortic valve regurgitation is mild. Mild aortic valve sclerosis is present, with no evidence of aortic valve stenosis.  5. Peak RV-RA gradient 19 mmHg.  6. The IVC was not visualized. FINDINGS  Left Ventricle:  Left ventricular ejection fraction, by estimation, is 55 to 60%. The left ventricle has normal function. The left ventricle has no regional wall motion abnormalities. The left ventricular internal cavity size was normal in size. There is  severe left ventricular hypertrophy. Left ventricular diastolic parameters are consistent with Grade I diastolic dysfunction (impaired relaxation). Right Ventricle: The right ventricular size is normal. No increase in right ventricular wall thickness. Right ventricular systolic function is normal. Left Atrium: Left atrial size was normal in size. Right Atrium: Right atrial size was normal in size. Pericardium: There is no evidence of pericardial effusion. Mitral Valve: The mitral valve is normal in structure. Mild mitral annular calcification. No evidence of mitral valve regurgitation. No evidence of mitral valve stenosis. Tricuspid Valve: The tricuspid valve is normal in structure. Tricuspid valve regurgitation is trivial. Aortic Valve: The aortic valve is tricuspid. Aortic valve regurgitation is mild. Aortic regurgitation PHT measures 1716 msec. Mild aortic valve sclerosis is present, with no evidence of aortic valve stenosis. Aortic valve mean gradient measures 4.0 mmHg.  Aortic valve peak gradient measures 6.2 mmHg. Aortic valve area, by VTI measures 2.56 cm. Pulmonic Valve: The pulmonic valve was normal in structure. Pulmonic valve regurgitation is trivial. Aorta: The aortic root is normal in size and structure. Venous: The inferior vena cava was not well visualized. IAS/Shunts: No atrial level shunt detected by color flow Doppler.  LEFT VENTRICLE PLAX 2D LVIDd:         4.01 cm  Diastology LVIDs:         2.84 cm  LV e' lateral:   5.93 cm/s LV PW:         1.62 cm  LV E/e' lateral: 8.0 LV IVS:        1.65 cm  LV e' medial:    3.38 cm/s LVOT diam:     2.20 cm  LV E/e' medial:  14.0 LV SV:  59 LV SV Index:   29 LVOT Area:     3.80 cm  RIGHT VENTRICLE RV Basal diam:  2.97  cm RV S prime:     11.10 cm/s TAPSE (M-mode): 1.8 cm LEFT ATRIUM             Index       RIGHT ATRIUM           Index LA diam:        2.00 cm 0.98 cm/m  RA Area:     16.40 cm LA Vol (A2C):   52.9 ml 25.87 ml/m RA Volume:   41.00 ml  20.05 ml/m LA Vol (A4C):   72.7 ml 35.55 ml/m LA Biplane Vol: 64.9 ml 31.74 ml/m  AORTIC VALVE AV Area (Vmax):    2.78 cm AV Area (Vmean):   2.59 cm AV Area (VTI):     2.56 cm AV Vmax:           125.00 cm/s AV Vmean:          92.000 cm/s AV VTI:            0.232 m AV Peak Grad:      6.2 mmHg AV Mean Grad:      4.0 mmHg LVOT Vmax:         91.40 cm/s LVOT Vmean:        62.700 cm/s LVOT VTI:          0.156 m LVOT/AV VTI ratio: 0.67 AI PHT:            1716 msec  AORTA Ao Root diam: 3.90 cm MITRAL VALVE               TRICUSPID VALVE MV Area (PHT): 3.27 cm    TR Peak grad:   19.9 mmHg MV Decel Time: 232 msec    TR Vmax:        223.00 cm/s MV E velocity: 47.30 cm/s MV A velocity: 68.60 cm/s  SHUNTS MV E/A ratio:  0.69        Systemic VTI:  0.16 m                            Systemic Diam: 2.20 cm Marca Ancona MD Electronically signed by Marca Ancona MD Signature Date/Time: 12/26/2019/3:59:06 PM    Final     Labs:  Basic Metabolic Panel: Recent Labs  Lab 01/05/20 0547 01/08/20 1142  NA  --  132*  K  --  4.0  CL  --  96*  CO2  --  25  GLUCOSE  --  113*  BUN  --  38*  CREATININE 1.36* 1.36*  CALCIUM  --  9.2    CBC: No results for input(s): WBC, NEUTROABS, HGB, HCT, MCV, PLT in the last 168 hours.  CBG: No results for input(s): GLUCAP in the last 168 hours.  Family history.  Mother with dementia Father with colon cancer Sister with CVA Brother with diabetes.  Denies any esophageal cancer rectal cancer  Brief HPI:   DWYNE HASEGAWA is a 69 y.o. right-handed male with history of hypertension.  Lives alone independent with assistive device 1 level home 3 steps to entry.  Presented 12/24/2019 with dysarthria and altered mental status.  Elevated blood pressure  systolics in the 200s placed on Cleviprex.  Cranial CT scan unremarkable for acute intracranial process.  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  Admission chemistries potassium 5.8 alcohol negative SARS coronavirus negative urinalysis negative nitrite.  Echocardiogram with ejection fraction of 60% grade 1 diastolic dysfunction.  Maintained on aspirin and Plavix x3 months and aspirin alone.  Subcutaneous Lovenox for DVT prophylaxis.  Therapy evaluations completed and patient was admitted for a comprehensive rehab program.   Hospital Course: BREYTON VANSCYOC was admitted to rehab 12/29/2019 for inpatient therapies to consist of PT, ST and OT at least three hours five days a week. Past admission physiatrist, therapy team and rehab RN have worked together to provide customized collaborative inpatient rehab.  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  He would remain on aspirin and Plavix x3 months then aspirin alone.  Follow-up neurology services.  Subcutaneous Lovenox for DVT prophylaxis.  Mood stabilization with Seroquel 12.5 mg twice daily.  Blood pressure control with hygroton, Norvasc as well as lisinopril he would follow-up with his primary MD.   Blood pressures were monitored on TID basis and controlled     Rehab course: During patient's stay in rehab weekly team conferences were held to monitor patient's progress, set goals and discuss barriers to discharge. At admission, patient required minimal assist 100 feet straight cane, minimal guard supine to sit minimal assist sit to stand.  Set up min guard upper body bathing minimal assist lower body bathing set up min guard upper body dressing minimal guard lower body dressing  Physical exam.   Blood pressure 154/76 pulse 54 temperature 97 respirations 18 oxygen saturations 100% room air Constitutional.  Well-developed well-nourished HEENT Head.  Normocephalic and atraumatic Eyes.  Pupils round and reactive to light no discharge.nystagmus Neck.  Supple nontender no JVD without thyromegaly Cardiac regular rate rhythm without any extra sounds or murmur heard Respiratory effort normal no respiratory distress without wheeze Abdomen.  Soft nontender positive bowel sounds without rebound Neurological.  Alert makes good eye contact with examiner follows commands provides name age date of birth speech was dysarthric but intelligible Motor.  Grossly graded 5/5 throughout mild left upper extremity greater than right upper extremity dysmetria.  He/  has had improvement in activity tolerance, balance, postural control as well as ability to compensate for deficits. He/ has had improvement in functional use RUE/LUE  and RLE/LLE as well as improvement in awareness.  Sessions focused on functional mobility in preparation for his upcoming discharge.  He can don and doff his shoes with set up.  Ambulates up to unit 200 feet straight point cane supervision.  Participated in car transfers ambulation on multiple stairs x12 bilateral handrails and use of rebounder for dynamic balance.  Patient also participated in bed mobility in the ADL apartment modified independent from standard bed.  He gather his belongings for activities day living and homemaking.  He can communicate his needs.  Patient did display some deficits in awareness and impact on daily function.  Stressed the need for ongoing safety with his daughter.  Demonstrated 70% intelligibility at phrase level for his needs.  Full family teaching completed plan discharge to home       Disposition: Discharge to home    Diet: Regular  Special Instructions: No driving smoking or alcohol  Continue aspirin and Plavix x3 months and aspirin  alone  Medications at discharge 1.  Tylenol as needed 2.  Norvasc 10  mg p.o. daily 3.  Aspirin 81 mg p.o. daily 4.  Lipitor 40 mg p.o. daily 5.  Chlorthalidone 25 mg p.o. daily 6.  Plavix 75 mg p.o. daily 7.  Lisinopril 5 mg p.o. daily 8.  Seroquel 12.5 mg p.o. twice daily bedtime as needed for agitation insomnia   30-35 minutes were spent completing discharge summary and discharge planning  Discharge Instructions    Ambulatory referral to Neurology   Complete by: As directed    An appointment is requested in approximately 4 weeks right superior cerebellar infarction   Ambulatory referral to Physical Medicine Rehab   Complete by: As directed    Moderate complexity follow-up 1 to 2 weeks superior cerebellar infarction      Follow-up Information    Kirsteins, Victorino Sparrow, MD Follow up.   Specialty: Physical Medicine and Rehabilitation Why: Office to call for appointment Contact information: [address] [place] [address] [place] [address] [phone]           Signed: Charlton Amor 01/08/2020, 5:40 PM

## 2020-01-08 NOTE — Progress Notes (Signed)
Speech Language Pathology Discharge Summary  Patient Details  Name: Brandon Perkins MRN: [id number] Date of Birth: 28-Nov-1950  Today's Date: 01/08/2020 SLP Individual Time: 1003-1059 SLP Individual Time Calculation (min): 56 min   Skilled Therapeutic Interventions:   Skilled ST services focused on education, swallow and speech skills. SLP facilitated recall of novel swallow strategies pt required mod A fade to min A verbal cues and extensive education pertaining to importance to utilize swallow strategies. SLP instructed pt to utilize throat clear and swallow when vocal qualities appears wet, pt required mod A verbal cues throughout session. Pt was unable to recall speech intelligibility initially, but gven extra time was able to locate visual aid posted in room. SLP provided education to daughter, Brandon Perkins via phone pertaining to swallow, speech and cognitive deficits and strategies. All questions were answered to satisfaction and SLP recorded strategies in memory notebook.  Pt was left in room with call bell within reach and chair alarm set. ST recommends to continue skilled ST services.     Patient has met 7 of 7 long term goals.  Patient to discharge at [facility] level.  Reasons goals not met:     Clinical Impression/Discharge Summary:   Pt made progress meeting 7 out 7 goals, discharging at min A, however some goals were downgraded during length of stay due to slow progress. Pt participated in [place] indicating need for esophageal precautions (small bites/sips, alternate liquids/solids and remain upgrade 30-60 minutes after eating) and use of throat clear and second swallow to clear pharyngeal residue, recommending regular textures and thin liquid diet. SLP facilitated cognitive skills with functional task such as money and medication management, and use of memory notebook. Pt continues to demonstrate deficits in awareness of deficits and their impact on daily function, basic to semi-complex problem  solving, short term recall and selective attention due to internal distraction. Pt demonstrated 70% intelligibility at phrase level, but intelligibility decreasing with increase length of speech and carryover of speech intelligibility strategies decreases. SLP provided education to pt and pt's daughter, Brandon Perkins, via phone pertaining to swallow, speech and cognitive strategies. Pt would continue to benefit from skilled ST service at next level of care to maximize functional independence and reduce burden of care.  Care Partner:  Caregiver Able to Provide Assistance: Yes  Type of Caregiver Assistance: Physical;Cognitive  Recommendation:  Home Health SLP;24 hour supervision/assistance  Rationale for SLP Follow Up: Maximize cognitive function and independence;Maximize functional communication;Maximize swallowing safety;Reduce caregiver burden   Equipment: N/A   Reasons for discharge: Discharged from hospital   Patient/Family Agrees with Progress Made and Goals Achieved: Yes    Brandon Perkins  [facility] 01/08/2020, 12:42 PM

## 2020-01-08 NOTE — Plan of Care (Signed)
  Problem: RH SAFETY Goal: RH STG ADHERE TO SAFETY PRECAUTIONS W/ASSISTANCE/DEVICE Description: STG Adhere to Safety Precautions With supervision assist  Outcome: Progressing Goal: RH STG DECREASED RISK OF FALL WITH ASSISTANCE Description: STG Decreased Risk of Fall With mod I assist   Outcome: Progressing   Problem: RH COGNITION-NURSING Goal: RH STG USES MEMORY AIDS/STRATEGIES W/ASSIST TO PROBLEM SOLVE Description: STG Uses Memory Aids/Strategies With cues/reminders assist to Problem Solve. Outcome: Progressing Goal: RH STG ANTICIPATES NEEDS/CALLS FOR ASSIST W/ASSIST/CUES Description: STG Anticipates Needs/Calls for Assist With cues/reminders assist Outcome: Progressing   Problem: RH KNOWLEDGE DEFICIT Goal: RH STG INCREASE KNOWLEDGE OF DIABETES Description: Patient will be able to identify 2 signs/symptoms of hypoglycemia and hyperglycemia. Patient will be able to verbalize 2 interventions in addition to medication for diabetes control with cues/reminders to use handouts, resources provided Outcome: Progressing Goal: RH STG INCREASE KNOWLEDGE OF HYPERTENSION Description: Patient will be able to identify what treatment medications patient is currently on and explain correlation between HTN and stroke risk using resources, handouts provided independently with cues/reminders to refer to resources Outcome: Progressing Goal: RH STG INCREASE KNOWLEGDE OF HYPERLIPIDEMIA Description: Patient will be able to verbalize what medication he is on for HLD control using cues/reminders Outcome: Progressing Goal: RH STG INCREASE KNOWLEDGE OF STROKE PROPHYLAXIS Description: Patient will be able to explain their own stroke risk factors and identify what medications they are on for stroke prophylaxis using cues/reminders and reference resources  Outcome: Progressing   Problem: Consults Goal: RH STROKE PATIENT EDUCATION Description: See Patient Education module for education specifics  Outcome:  Progressing

## 2020-01-09 MED ORDER — AMLODIPINE BESYLATE 10 MG PO TABS: 10.0000 mg | ORAL_TABLET | Freq: Every day | ORAL | 0 refills | Status: DC

## 2020-01-09 MED ORDER — QUETIAPINE FUMARATE 25 MG PO TABS: 12.5000 mg | ORAL_TABLET | Freq: Two times a day (BID) | ORAL | 0 refills | Status: DC | PRN

## 2020-01-09 MED ORDER — ATORVASTATIN CALCIUM 40 MG PO TABS: 40.0000 mg | ORAL_TABLET | Freq: Every day | ORAL | 0 refills | Status: DC

## 2020-01-09 MED ORDER — LISINOPRIL 5 MG PO TABS: 5.0000 mg | ORAL_TABLET | Freq: Every day | ORAL | 0 refills | Status: DC

## 2020-01-09 MED ORDER — CLOPIDOGREL BISULFATE 75 MG PO TABS: 75.0000 mg | ORAL_TABLET | Freq: Every day | ORAL | 0 refills | Status: DC

## 2020-01-09 MED ORDER — CLOPIDOGREL BISULFATE 75 MG PO TABS
75.0000 mg | ORAL_TABLET | Freq: Every day | ORAL | 11 refills | Status: DC
Start: 2020-01-09 — End: 2020-01-31

## 2020-01-09 MED ORDER — CHLORTHALIDONE 25 MG PO TABS: 25.0000 mg | ORAL_TABLET | Freq: Every day | ORAL | 0 refills | Status: DC

## 2020-01-09 NOTE — Progress Notes (Signed)
Inpatient Rehabilitation Care Coordinator  Discharge Note  The overall goal for the admission was met for:   Discharge location: Yes, Home  Length of Stay: Yes, 11 Days  Discharge activity level: Yes  Home/community participation: Yes  Services provided included: MD, RD, PT, OT, SLP, RN, CM, TR, Pharmacy and [place]: Medicare  Follow-up services arranged: Home Health: [facility] (or additional information): PT OT ST  Patient/Family verbalized understanding of follow-up arrangements: Yes  Individual responsible for coordination of the follow-up plan: Solmon Ice [phone]  Confirmed correct DME delivered: Dyanne Iha 01/09/2020    Dyanne Iha

## 2020-01-09 NOTE — Discharge Instructions (Signed)
Inpatient Rehab Discharge Instructions  KARLON SCHLAFER Discharge date and time: No discharge date for patient encounter.   Activities/Precautions/ Functional Status: Activity: activity as tolerated Diet: regular diet Wound Care: keep wound clean and dry Functional status:  ___ No restrictions     ___ Walk up steps independently ___ 24/7 supervision/assistance   ___ Walk up steps with assistance ___ Intermittent supervision/assistance  ___ Bathe/dress independently ___ Walk with walker     _x__ Bathe/dress with assistance ___ Walk Independently    ___ Shower independently ___ Walk with assistance    ___ Shower with assistance ___ No alcohol     ___ Return to work/school ________  COMMUNITY REFERRALS UPON DISCHARGE:    Home Health:   PT     OT   ST                 Agency: [facility]  Phone: [phone]    Medical Equipment/Items Ordered: Agricultural consultant, Tub Advertising copywriter                                                 Agency/Supplier: [employer]   Special Instructions: PCP Follow Up: [facility], June 22 at 8:50 AM No driving smoking or alcohol  Continue aspirin and Plavix x3 months then aspirin alone STROKE/TIA DISCHARGE INSTRUCTIONS SMOKING Cigarette smoking nearly doubles your risk of having a stroke & is the single most alterable risk factor  If you smoke or have smoked in the last 12 months, you are advised to quit smoking for your health.  Most of the excess cardiovascular risk related to smoking disappears within a year of stopping.  Ask you doctor about anti-smoking medications  [place] Quit Line: 1-800-QUIT NOW  Free Smoking Cessation Classes [phone]  CHOLESTEROL Know your levels; limit fat & cholesterol in your diet  Lipid Panel     Component Value Date/Time   CHOL 202 (H) 12/25/2019 0719   TRIG 51 12/25/2019 0719   HDL 64 12/25/2019 0719   CHOLHDL 3.2 12/25/2019 0719   VLDL 10 12/25/2019 0719   LDLCALC 128 (H) 12/25/2019 0719       Many patients benefit from treatment even if their cholesterol is at goal.  Goal: Total Cholesterol (CHOL) less than 160  Goal:  Triglycerides (TRIG) less than 150  Goal:  HDL greater than 40  Goal:  LDL (LDLCALC) less than 100   BLOOD PRESSURE American Stroke Association blood pressure target is less that 120/80 mm/Hg  Your discharge blood pressure is:  BP: 140/77  Monitor your blood pressure  Limit your salt and alcohol intake  Many individuals will require more than one medication for high blood pressure  DIABETES (A1c is a blood sugar average for last 3 months) Goal HGBA1c is under 7% (HBGA1c is blood sugar average for last 3 months)  Diabetes: No known diagnosis of diabetes    Lab Results  Component Value Date   HGBA1C 6.0 (H) 12/25/2019     Your HGBA1c can be lowered with medications, healthy diet, and exercise.  Check your blood sugar as directed by your physician  Call your physician if you experience unexplained or low blood sugars.  PHYSICAL ACTIVITY/REHABILITATION Goal is 30 minutes at least 4 days per week  Activity: Increase activity slowly, Therapies: Physical Therapy: Home Health Return to work:   Activity  decreases your risk of heart attack and stroke and makes your heart stronger.  It helps control your weight and blood pressure; helps you relax and can improve your mood.  Participate in a regular exercise program.  Talk with your doctor about the best form of exercise for you (dancing, walking, swimming, cycling).  DIET/WEIGHT Goal is to maintain a healthy weight  Your discharge diet is:  Diet Order            Diet Heart Room service appropriate? Yes; Fluid consistency: Thin  Diet effective now              liquids Your height is:  Height: 6\' 2"  (188 cm) Your current weight is: Weight: 75.2 kg Your Body Mass Index (BMI) is:  BMI (Calculated): 21.28  Following the type of diet specifically designed for you will help prevent another  stroke.  Your goal weight range is:    Your goal Body Mass Index (BMI) is 19-24.  Healthy food habits can help reduce 3 risk factors for stroke:  High cholesterol, hypertension, and excess weight.  RESOURCES Stroke/Support Group:  Call [phone]   STROKE EDUCATION PROVIDED/REVIEWED AND GIVEN TO PATIENT Stroke warning signs and symptoms How to activate emergency medical system (call 911). Medications prescribed at discharge. Need for follow-up after discharge. Personal risk factors for stroke. Pneumonia vaccine given:  Flu vaccine given:  My questions have been answered, the writing is legible, and I understand these instructions.  I will adhere to these goals & educational materials that have been provided to me after my discharge from the hospital.      My questions have been answered and I understand these instructions. I will adhere to these goals and the provided educational materials after my discharge from the hospital.  Patient/Caregiver Signature _______________________________ Date __________  Clinician Signature _______________________________________ Date __________  Please bring this form and your medication list with you to all your follow-up doctor's appointments.

## 2020-01-09 NOTE — Progress Notes (Signed)
Pt was DC'd from the unit. All questions were answered and pt left with all personal belongings. Reuben Likes, LPN

## 2020-01-09 NOTE — Progress Notes (Signed)
PHYSICAL MEDICINE & REHABILITATION PROGRESS NOTE   Subjective/Complaints: Patient feels ready for discharge today no complaints this morning.  Dressing with nurse tach  ROS: Denies CP, SOB, N/V/D  Objective:   No results found. No results for input(s): WBC, HGB, HCT, PLT in the last 72 hours. Recent Labs    01/08/20 1142  NA 132*  K 4.0  CL 96*  CO2 25  GLUCOSE 113*  BUN 38*  CREATININE 1.36*  CALCIUM 9.2    Intake/Output Summary (Last 24 hours) at 01/09/2020 0942 Last data filed at 01/09/2020 0723 Gross per 24 hour  Intake 720 ml  Output 150 ml  Net 570 ml     Physical Exam: Vital Signs Blood pressure 124/64, pulse (!) 57, temperature 97.8 F (36.6 C), resp. rate 16, height 6\' 2"  (1.88 m), weight 75.2 kg, SpO2 99 %.  General: No acute distress Mood and affect are appropriate Standing balance is fair Moves upper limbs equally for dressing tasks Speech without dysarthria or aphasia Oriented to person place and time  Assessment/Plan: 1. Functional deficits secondary to Right superior cerebellar artery infarct   Stable for D/C today F/u PCP in 3-4 weeks F/u PM&R 2 weeks See D/C summary See D/C instructions Care Tool:  Bathing    Body parts bathed by patient: Right arm, Left arm, Chest, Abdomen, Front perineal area, Buttocks, Right upper leg, Left upper leg, Face         Bathing assist Assist Level: Supervision/Verbal cueing     Upper Body Dressing/Undressing Upper body dressing   What is the patient wearing?: Pull over shirt    Upper body assist Assist Level: Supervision/Verbal cueing    Lower Body Dressing/Undressing Lower body dressing      What is the patient wearing?: Pants, Underwear/pull up     Lower body assist Assist for lower body dressing: Supervision/Verbal cueing     Toileting Toileting    Toileting assist Assist for toileting: Supervision/Verbal cueing Assistive Device Comment: SPC   Transfers Chair/bed  transfer  Transfers assist     Chair/bed transfer assist level: Independent with assistive device     Locomotion Ambulation   Ambulation assist      Assist level: Supervision/Verbal cueing Assistive device: Cane-straight Max distance: 15ft   Walk 10 feet activity   Assist     Assist level: Supervision/Verbal cueing Assistive device: Cane-straight   Walk 50 feet activity   Assist    Assist level: Supervision/Verbal cueing Assistive device: Cane-straight    Walk 150 feet activity   Assist Walk 150 feet activity did not occur: Safety/medical concerns(fatigue, LE weakness, decreased postural control)  Assist level: Supervision/Verbal cueing Assistive device: Cane-straight    Walk 10 feet on uneven surface  activity   Assist Walk 10 feet on uneven surfaces activity did not occur: Safety/medical concerns(fatigue, LE weakness, decreased postural control)   Assist level: Contact Guard/Touching assist Assistive device: Hospital doctor     Assist Will patient use wheelchair at discharge?: No Type of Wheelchair: Manual    Wheelchair assist level: Supervision/Verbal cueing Max wheelchair distance: 71ft    Wheelchair 50 feet with 2 turns activity    Assist        Assist Level: Supervision/Verbal cueing   Wheelchair 150 feet activity     Assist      Assist Level: Maximal Assistance - Patient 25 - 49%   Blood pressure 124/64, pulse (!) 57, temperature 97.8 F (36.6 C), resp. rate 16, height 6'  2" (1.88 m), weight 75.2 kg, SpO2 99 %.    Medical Problem List and Plan:  1. Slurred speech and altered mental status with left-sided weakness secondary to right superior cerebellar infarct secondary to large vessel disease  -patient may shower   Discharge home today 2. Antithrombotics:  -DVT/anticoagulation: Lovenox  -antiplatelet therapy: Aspirin Plavix x3 months then aspirin alone  3. Pain Management: Tylenol as needed   Monitor for headaches.  4. Mood: Provide emotional support  -antipsychotic agents: Seroquel 12.5 mg twice daily, changed to nightly as needed 5. Neuropsych: This patient is ?fully capable of making decisions on his own behalf.  6. Skin/Wound Care: Routine skin checks  7. Fluids/Electrolytes/Nutrition: Routine in and outs.  8. Hypertension. Hygroton 25 mg daily, Norvasc 10 mg daily lisinopril 10 mg daily.   Controlled on 6/8  Monitor with increased mobility. Vitals:   01/08/20 1915 01/09/20 0657  BP: 116/73 124/64  Pulse: 85 (!) 57  Resp: 16 16  Temp: 98.7 F (37.1 C) 97.8 F (36.6 C)  SpO2: 100% 99%    9. Hyperlipidemia. Continue Lipitor 10. Acute renal impairment   Off hygroton and on lower dose Zestril, fluid intake poor     LOS: 11 days A FACE TO FACE EVALUATION WAS PERFORMED  Erick Colace 01/09/2020, 9:42 AM

## 2020-01-11 ENCOUNTER — Telehealth: Payer: Self-pay | Admitting: Registered Nurse

## 2020-01-11 NOTE — Telephone Encounter (Signed)
Transitional Care call Transitional Questions Answered by [name]   Patient name: Brandon Perkins  DOB: 1951/06/26 1. Are you/is patient experiencing any problems since coming home? No a. Are there any questions regarding any aspect of care? No 2. Are there any questions regarding medications administration/dosing? No a. Are meds being taken as prescribed? Yes b. Patient should review meds with caller to confirm Medication List Reviewed 3. Have there been any falls? No  4. Has Home Health been to the house and/or have they contacted you? Yes, [facility] a. If not, have you tried to contact them? NA b. Can we help you contact them? NA 5. Are bowels and bladder emptying properly?Yes a. Are there any unexpected incontinence issues? No b. If applicable, is patient following bowel/bladder programs? NA 6. Any fevers, problems with breathing, unexpected pain? No 7. Are there any skin problems or new areas of breakdown? No 8. Has the patient/family member arranged specialty MD follow up (ie cardiology/neurology/renal/surgical/etc.)?  [name] was instructed to call [facility] a. Can we help arrange? No 9. Does the patient need any other services or support that we can help arrange? No 10. Are caregivers following through as expected in assisting the patient? Yes 11. Has the patient quit smoking, drinking alcohol, or using drugs as recommended? (                        )  Appointment date/time 01/18/2020  arrival time 11:15 for 11:30 Appointment with Dr Wynn Banker. At [address] [facility] [address]

## 2020-01-18 ENCOUNTER — Telehealth: Payer: Self-pay | Admitting: *Deleted

## 2020-01-18 ENCOUNTER — Encounter: Payer: Medicare Other | Attending: Physical Medicine & Rehabilitation | Admitting: Physical Medicine & Rehabilitation

## 2020-01-18 ENCOUNTER — Telehealth: Payer: Self-pay

## 2020-01-18 NOTE — Telephone Encounter (Signed)
Ivar Drape RN with [facility] called: Verbal  okay given for OT 1x weekly for 4 weeks.   (Discharge orders reviewed). Call back ph # [phone]

## 2020-01-18 NOTE — Telephone Encounter (Signed)
[place] Bullins ST [facility] called for ST POC 1wk1,2wk2, 1wk2 to work on cognition communication dysarthria and dysphagia.  Approval given.

## 2020-01-23 ENCOUNTER — Ambulatory Visit (INDEPENDENT_AMBULATORY_CARE_PROVIDER_SITE_OTHER): Payer: Medicare Other | Admitting: Primary Care

## 2020-01-31 ENCOUNTER — Other Ambulatory Visit: Payer: Self-pay

## 2020-01-31 ENCOUNTER — Ambulatory Visit (INDEPENDENT_AMBULATORY_CARE_PROVIDER_SITE_OTHER): Payer: Medicare Other | Admitting: Family

## 2020-01-31 ENCOUNTER — Encounter: Payer: Self-pay | Admitting: Family

## 2020-01-31 VITALS — BP 132/78 | HR 56 | Temp 98.3°F | Wt 173.4 lb

## 2020-01-31 DIAGNOSIS — R400 Somnolence: Secondary | ICD-10-CM

## 2020-01-31 DIAGNOSIS — Z125 Encounter for screening for malignant neoplasm of prostate: Secondary | ICD-10-CM | POA: Diagnosis not present

## 2020-01-31 DIAGNOSIS — H3581 Retinal edema: Secondary | ICD-10-CM

## 2020-01-31 DIAGNOSIS — I63541 Cerebral infarction due to unspecified occlusion or stenosis of right cerebellar artery: Secondary | ICD-10-CM

## 2020-01-31 DIAGNOSIS — R350 Frequency of micturition: Secondary | ICD-10-CM | POA: Diagnosis not present

## 2020-01-31 DIAGNOSIS — I10 Essential (primary) hypertension: Secondary | ICD-10-CM | POA: Diagnosis not present

## 2020-01-31 DIAGNOSIS — R7309 Other abnormal glucose: Secondary | ICD-10-CM

## 2020-01-31 LAB — COMPREHENSIVE METABOLIC PANEL
ALT: 14 U/L (ref 0–53)
AST: 16 U/L (ref 0–37)
Albumin: 4 g/dL (ref 3.5–5.2)
Alkaline Phosphatase: 72 U/L (ref 39–117)
BUN: 26 mg/dL — ABNORMAL HIGH (ref 6–23)
CO2: 31 mEq/L (ref 19–32)
Calcium: 9.4 mg/dL (ref 8.4–10.5)
Chloride: 99 mEq/L (ref 96–112)
Creatinine, Ser: 1.11 mg/dL (ref 0.40–1.50)
GFR: 79.87 mL/min (ref >60.00)
Glucose, Bld: 105 mg/dL — ABNORMAL HIGH (ref 70–99)
Potassium: 3.9 mEq/L (ref 3.5–5.1)
Sodium: 138 mEq/L (ref 135–145)
Total Bilirubin: 0.9 mg/dL (ref 0.2–1.2)
Total Protein: 7.3 g/dL (ref 6.0–8.3)

## 2020-01-31 LAB — CBC WITH DIFFERENTIAL/PLATELET
Basophils Absolute: 0 10*3/uL (ref 0.0–0.1)
Basophils Relative: 0.4 % (ref 0.0–3.0)
Eosinophils Absolute: 0.1 10*3/uL (ref 0.0–0.7)
Eosinophils Relative: 1.3 % (ref 0.0–5.0)
HCT: 35.7 % — ABNORMAL LOW (ref 39.0–52.0)
Hemoglobin: 11.6 g/dL — ABNORMAL LOW (ref 13.0–17.0)
Lymphocytes Relative: 29.1 % (ref 12.0–46.0)
Lymphs Abs: 1.4 10*3/uL (ref 0.7–4.0)
MCHC: 32.6 g/dL (ref 30.0–36.0)
MCV: 80.9 fl (ref 78.0–100.0)
Monocytes Absolute: 0.6 10*3/uL (ref 0.1–1.0)
Monocytes Relative: 11.5 % (ref 3.0–12.0)
Neutro Abs: 2.8 10*3/uL (ref 1.4–7.7)
Neutrophils Relative %: 57.7 % (ref 43.0–77.0)
Platelets: 229 10*3/uL (ref 150.0–400.0)
RBC: 4.41 Mil/uL (ref 4.22–5.81)
RDW: 15.6 % — ABNORMAL HIGH (ref 11.5–15.5)
WBC: 4.9 10*3/uL (ref 4.0–10.5)

## 2020-01-31 LAB — URINALYSIS, ROUTINE W REFLEX MICROSCOPIC
Bilirubin Urine: NEGATIVE
Hgb urine dipstick: NEGATIVE
Ketones, ur: NEGATIVE
Nitrite: NEGATIVE
RBC / HPF: NONE SEEN (ref 0–?)
Specific Gravity, Urine: 1.02 (ref 1.000–1.030)
Total Protein, Urine: NEGATIVE
Urine Glucose: NEGATIVE
Urobilinogen, UA: 1 (ref 0.0–1.0)
pH: 6 (ref 5.0–8.0)

## 2020-01-31 LAB — PSA, MEDICARE: PSA: 3.28 ng/ml (ref 0.10–4.00)

## 2020-01-31 LAB — HEMOGLOBIN A1C: Hgb A1c MFr Bld: 7.1 % — ABNORMAL HIGH (ref 4.6–6.5)

## 2020-01-31 MED ORDER — AMLODIPINE BESYLATE 10 MG PO TABS: 10.0000 mg | ORAL_TABLET | Freq: Every day | ORAL | 3 refills | Status: DC

## 2020-01-31 MED ORDER — CLOPIDOGREL BISULFATE 75 MG PO TABS: 75.0000 mg | ORAL_TABLET | Freq: Every day | ORAL | 3 refills | Status: DC

## 2020-01-31 MED ORDER — CHLORTHALIDONE 25 MG PO TABS: 25.0000 mg | ORAL_TABLET | Freq: Every day | ORAL | 3 refills | Status: DC

## 2020-01-31 MED ORDER — SULFAMETHOXAZOLE-TRIMETHOPRIM 800-160 MG PO TABS
1.0000 | ORAL_TABLET | Freq: Two times a day (BID) | ORAL | 0 refills | Status: DC
Start: 2020-01-31 — End: 2021-01-14

## 2020-01-31 MED ORDER — ATORVASTATIN CALCIUM 40 MG PO TABS: 40.0000 mg | ORAL_TABLET | Freq: Every day | ORAL | 3 refills | Status: DC

## 2020-01-31 MED ORDER — LISINOPRIL 5 MG PO TABS: 5.0000 mg | ORAL_TABLET | Freq: Every day | ORAL | 3 refills | Status: DC

## 2020-01-31 NOTE — Progress Notes (Signed)
Brandon Perkins is a 69 y.o. male with the following history as recorded in EpicCare:  Patient Active Problem List   Diagnosis Date Noted  . AKI (acute kidney injury) ([place])   . Essential hypertension   . Cerebral infarction involving right cerebellar artery ([place]) 12/29/2019  . Benign essential HTN   . Dyslipidemia   . Dysarthria, post-stroke   . Cerebral thrombosis with cerebral infarction 12/25/2019  . Hypertensive emergency 12/24/2019  . Hypokalemia 11/07/2014  . Hypertensive urgency 11/07/2014  . Elevated blood pressure 11/06/2014  . Syncope, near 11/06/2014  . GI bleeding 11/06/2014  . Hand laceration 11/06/2014    Current Outpatient Medications  Medication Sig Dispense Refill  . amLODipine (NORVASC) 10 MG tablet Take 1 tablet (10 mg total) by mouth daily. 90 tablet 3  . atorvastatin (LIPITOR) 40 MG tablet Take 1 tablet (40 mg total) by mouth daily. 90 tablet 3  . chlorthalidone (HYGROTON) 25 MG tablet Take 1 tablet (25 mg total) by mouth daily. 90 tablet 3  . clopidogrel (PLAVIX) 75 MG tablet Take 1 tablet (75 mg total) by mouth daily. 90 tablet 3  . lisinopril (ZESTRIL) 5 MG tablet Take 1 tablet (5 mg total) by mouth daily. 90 tablet 3  . aspirin EC 81 MG tablet Take 1 tablet (81 mg total) by mouth daily.    . QUEtiapine (SEROQUEL) 25 MG tablet Take 0.5 tablets (12.5 mg total) by mouth 3 times/day as needed-between meals & bedtime (for agitation, insomnia). (Patient not taking: Reported on 01/31/2020) 60 tablet 0  . sulfamethoxazole-trimethoprim (BACTRIM DS) 800-160 MG tablet Take 1 tablet by mouth 2 (two) times daily. 10 tablet 0   No current facility-administered medications for this visit.    Allergies: Aspirin  Past Medical History:  Diagnosis Date  . Blood clot in vein    left arm vein blood clot, not know the detail, treated with ASA per pt  . Cataract    OU  . Hypertension   . Hypertensive retinopathy    OU    No past surgical history on file.  Family History   Problem Relation Age of Onset  . Dementia Mother   . Colon cancer Father   . Stroke Sister        Sister had brain aneurysm  . Diabetes Brother     Social History   Tobacco Use  . Smoking status: Never Smoker  . Smokeless tobacco: Never Used  Substance Use Topics  . Alcohol use: No    Subjective:  Patient presents today as a new patient. Is accompanied by son-in- law today; Was admitted to [facility] on 5/23 with hypertensive crisis and acute stroke; is having PT and OT at home; Notes that he has been feeling okay since leaving hospital;  Notes he has been having some increased urinary frequency/ incontinence since leaving the hospital;  Continuing to have vision problems in right eye- has been working with retina specialist; has not seen since September 2020;    Objective:  Vitals:   01/31/20 0928  BP: 132/78  Pulse: (!) 56  Temp: 98.3 F (36.8 C)  TempSrc: Oral  SpO2: 97%  Weight: 173 lb 6.4 oz (78.7 kg)    General: Well developed, well nourished, in no acute distress  Skin : Warm and dry.  Head: Normocephalic and atraumatic  Eyes: Sclera and conjunctiva clear; pupils round and reactive to light; extraocular movements intact  Ears: External normal; canals clear; tympanic membranes normal  Oropharynx: Pink, supple. No  suspicious lesions  Neck: Supple without thyromegaly, adenopathy  Lungs: Respirations unlabored; clear to auscultation bilaterally without wheeze, rales, rhonchi  CVS exam: normal rate and regular rhythm.  Neurologic: Alert and oriented; speech intact; face symmetrical; uses walker; CNII-XII intact without focal deficit   Assessment:  1. Cerebral infarction involving right cerebellar artery ([place])   2. Essential hypertension   3. Urinary frequency   4. Prostate cancer screening   5. Elevated glucose   6. Retinal edema   7. Sleepiness     Plan:  1. Appears to be doing very well; continue PT and OT at home; keep planned follow-up with specialist for next  week; 2. Stable; refills updated; 3. ? Infection; check u/a, urine culture, PSA today; start Bactrim DS bid x 5 days; 4. Check PSA; 5. Check Hgba1c; 6. Refer back to his ophthalmologist; 7.     Patient does not think he is taking the Seroquel listed on his medication list and is asked to try and hold it for now if he is as he feels like has been more sleepy recently; follow up to be determined.   Patient defers updating vaccines at this time; he is not cleared to return to work at this time and will discuss further with Dr. Letta Pate at appointment next week; Will plan to discuss other preventive healthcare needs such as colonoscopy at next OV;  This visit occurred during the SARS-CoV-2 public health emergency.  Safety protocols were in place, including screening questions prior to the visit, additional usage of staff PPE, and extensive cleaning of exam room while observing appropriate contact time as indicated for disinfecting solutions.     Return in about 2 months (around 04/01/2020).  Orders Placed This Encounter  Procedures  . Urine Culture  . CBC with Differential/Platelet  . Comp Met (CMET)  . HgB A1c  . Urinalysis  . PSA, Medicare  . Ambulatory referral to Ophthalmology    Referral Priority:   Routine    Referral Type:   Consultation    Referral Reason:   Specialty Services Required    Referred to Provider:   Bernarda Caffey, MD    Requested Specialty:   Ophthalmology    Number of Visits Requested:   1    Requested Prescriptions   Signed Prescriptions Disp Refills  . amLODipine (NORVASC) 10 MG tablet 90 tablet 3    Sig: Take 1 tablet (10 mg total) by mouth daily.  Marland Kitchen atorvastatin (LIPITOR) 40 MG tablet 90 tablet 3    Sig: Take 1 tablet (40 mg total) by mouth daily.  . chlorthalidone (HYGROTON) 25 MG tablet 90 tablet 3    Sig: Take 1 tablet (25 mg total) by mouth daily.  . clopidogrel (PLAVIX) 75 MG tablet 90 tablet 3    Sig: Take 1 tablet (75 mg total) by mouth daily.   Marland Kitchen lisinopril (ZESTRIL) 5 MG tablet 90 tablet 3    Sig: Take 1 tablet (5 mg total) by mouth daily.  Marland Kitchen sulfamethoxazole-trimethoprim (BACTRIM DS) 800-160 MG tablet 10 tablet 0    Sig: Take 1 tablet by mouth 2 (two) times daily.

## 2020-02-01 LAB — URINE CULTURE: Result:: NO GROWTH

## 2020-02-02 ENCOUNTER — Telehealth: Payer: Self-pay

## 2020-02-02 NOTE — Telephone Encounter (Signed)
[place], ST from [facility] missed visit today. No answer at door.

## 2020-02-07 ENCOUNTER — Telehealth: Payer: Self-pay

## 2020-02-07 NOTE — Telephone Encounter (Signed)
Verbal order given to [place] with [facility] for speech therapy once weekly  for 4 weeks.

## 2020-02-08 ENCOUNTER — Encounter: Payer: Medicare Other | Attending: Physical Medicine & Rehabilitation | Admitting: Physical Medicine & Rehabilitation

## 2020-02-28 NOTE — Progress Notes (Signed)
[facility] Note  03/06/2020     CHIEF COMPLAINT Patient presents for Retina Follow Up   HISTORY OF PRESENT ILLNESS: Brandon Perkins is a 69 y.o. male who presents to the clinic today for:   HPI    Retina Follow Up    Patient presents with  CRVO/BRVO.  In right eye.  This started 14 months ago.  Severity is moderate.  Duration of 11 months.  Since onset it is gradually worsening.  I, the attending physician,  performed the HPI with the patient and updated documentation appropriately.          Comments    69 y/o male pt returning after 11 mos for f/u for BRVO OD.  Pt was last seen 9.8.20, and was supposed to return in 6 wks.  Pt does not recall why f/u was lost.  Pt had stroke several mos ago.  Feels VA is slightly blurrier OU.  Denies pain, FOL, floaters.  No gtts.       Last edited by Bernarda Caffey, MD on 03/06/2020  9:33 AM. (History)    pt is delayed to follow up from 6 weeks to 11 months, pt had a stroke in May, he is doing in home therapy now, he is not sure what caused the stroke, he states it left his left side weak. BP in hospital was >200.  Referring physician: Marrian Salvage, [place] [place],  [place] [address]  HISTORICAL INFORMATION:   Selected notes from the MEDICAL RECORD NUMBER Referred by Dr. Maryjane Hurter for concern of BRVO OD LEE: 04.29.19 (M.Le) [BCVA: OD: 20/400 OS: 20/20] Ocular Hx- PMH-hx of blood clots    CURRENT MEDICATIONS: No current outpatient medications on file. (Ophthalmic Drugs)   No current facility-administered medications for this visit. (Ophthalmic Drugs)   Current Outpatient Medications (Other)  Medication Sig   amLODipine (NORVASC) 10 MG tablet Take 1 tablet (10 mg total) by mouth daily.   aspirin EC 81 MG tablet Take 1 tablet (81 mg total) by mouth daily.   atorvastatin (LIPITOR) 40 MG tablet Take 1 tablet (40 mg total) by mouth daily.   chlorthalidone (HYGROTON) 25 MG tablet Take 1 tablet  (25 mg total) by mouth daily.   clopidogrel (PLAVIX) 75 MG tablet Take 1 tablet (75 mg total) by mouth daily.   lisinopril (ZESTRIL) 5 MG tablet Take 1 tablet (5 mg total) by mouth daily.   QUEtiapine (SEROQUEL) 25 MG tablet Take 0.5 tablets (12.5 mg total) by mouth 3 times/day as needed-between meals & bedtime (for agitation, insomnia). (Patient not taking: Reported on 01/31/2020)   sulfamethoxazole-trimethoprim (BACTRIM DS) 800-160 MG tablet Take 1 tablet by mouth 2 (two) times daily.   No current facility-administered medications for this visit. (Other)      REVIEW OF SYSTEMS: ROS    Positive for: Neurological, Eyes   Negative for: Constitutional, Gastrointestinal, Skin, Genitourinary, Musculoskeletal, HENT, Endocrine, Cardiovascular, Respiratory, Psychiatric, Allergic/Imm, Heme/Lymph   Last edited by Matthew Folks, COA on 03/06/2020  8:39 AM. (History)       ALLERGIES Allergies  Allergen Reactions   Aspirin     When takes a lot of aspirin-- reaction unknown    PAST MEDICAL HISTORY Past Medical History:  Diagnosis Date   Blood clot in vein    left arm vein blood clot, not know the detail, treated with ASA per pt   Cataract    OU   Hypertension  Hypertensive retinopathy    OU   History reviewed. No pertinent surgical history.  FAMILY HISTORY Family History  Problem Relation Age of Onset   Dementia Mother    Colon cancer Father    Stroke Sister        Sister had brain aneurysm   Diabetes Brother     SOCIAL HISTORY Social History   Tobacco Use   Smoking status: Never Smoker   Smokeless tobacco: Never Used  Scientific laboratory technician Use: Never used  Substance Use Topics   Alcohol use: No   Drug use: No         OPHTHALMIC EXAM:  Base Eye Exam    Visual Acuity (Snellen - Linear)      Right Left   Dist [place] 20/40 -2 20/30 -2   Dist ph [place] NI 20/25 +2       Tonometry (Tonopen, 8:46 AM)      Right Left   Pressure 16 16       Pupils       Dark Light Shape React APD   Right 3 2 Round Minimal None   Left 3 2 Round Minimal None       Visual Fields (Counting fingers)      Left Right    Full Full       Extraocular Movement      Right Left    Full, Ortho Full, Ortho       Neuro/Psych    Oriented x3: Yes   Mood/Affect: Normal       Dilation    Both eyes: 1.0% Mydriacyl, 2.5% Phenylephrine @ 8:48 AM        Slit Lamp and Fundus Exam    Slit Lamp Exam      Right Left   Lids/Lashes Dermatochalasis - upper lid, mild Meibomian gland dysfunction Dermatochalasis - upper lid, mild Meibomian gland dysfunction   Conjunctiva/Sclera mild nasal Pinguecula, mild Melanosis mild nasal Pinguecula, mild Melanosis   Cornea mild Arcus, 1+ Punctate epithelial erosions, mild Debris in tear film mild Arcus, 1+ inferior Punctate epithelial erosions, focal area of irregular epi at 0730   Anterior Chamber Deep and quiet, narrow angles Deep and quiet, narrow angles   Iris Round and dilated, No NVI Round and dilated, No NVI   Lens 2-3+ Nuclear sclerosis, 2-3+ Cortical cataract,, Vacuoles 2-3+ Nuclear sclerosis, 2-3+ Cortical cataract   Vitreous Vitreous syneresis, tr white vitreous condensations inferior--clearing Vitreous syneresis       Fundus Exam      Right Left   Disc Pink and Sharp, +vascular loops Pink and Sharp   C/D Ratio 0.6 0.5   Macula Blunted foveal reflex, scattered Atrophy, Exudates, +IRH and telangiectatic vessels - improved, interval improvement in CME Flat, Good foveal reflex, Retinal pigment epithelial mottling, CWS SN mac, No heme or edema   Vessels Vascular attenuation, Copper wiring, dilated and tortuous venules greatest inferiorly, early sclerosis of inferior arterioles with +plaques, mild fibrosis along inferior arcades, inferior HRVO Vascular attenuation, AV crossing changes, Copper wiring   Periphery Attached, scattered IRF inferior hemisphere -- improved, mild White without pressure superiorly, good PRP inferior  hemisphere Attached, +DBH nasal midzone, No heme           IMAGING AND PROCEDURES  Imaging and Procedures for _0 @  OCT, Retina - OU - Both Eyes       Right Eye Quality was good. Central Foveal Thickness: 240. Progression has improved. Findings include no SRF, outer retinal  atrophy, epiretinal membrane, normal foveal contour, no IRF, subretinal hyper-reflective material (Interval improvement in cystic changes; focal VMT along IT arcades).   Left Eye Quality was good. Central Foveal Thickness: 282. Progression has been stable. Findings include normal foveal contour, no SRF, no IRF (Partial PVD).   Notes *Images captured and stored on drive  Diagnosis / Impression:  OD: Interval improvement in cystic changes; focal VMT along IT arcades OS: NFP, no IRF/SRF--stable   Clinical management:  See below  Abbreviations: NFP - Normal foveal profile. CME - cystoid macular edema. PED - pigment epithelial detachment. IRF - intraretinal fluid. SRF - subretinal fluid. EZ - ellipsoid zone. ERM - epiretinal membrane. ORA - outer retinal atrophy. ORT - outer retinal tubulation. SRHM - subretinal hyper-reflective material                 ASSESSMENT/PLAN:    ICD-10-CM   1. Branch retinal vein occlusion of right eye with macular edema  H34.8310   2. 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 Essential hypertension  I10   5. Hypertensive retinopathy of both eyes  H35.033   6. Combined forms of age-related cataract of both eyes  H25.813     1,2. Remote, inferior HRVO OD  - originally referred by Dr. Marin Comment in April 2019, but pt never presented for consult  - exam shows diffuse macular atrophy, +collateral vessels on the disc, scattered telangectatic vessels inferiorly  - FA (02.25.20)  shows delayed filling, vascular perfusion defects, late leakage  - s/p sectoral PRP OD to inferior hemisphere (02.25.20)  - s/p IVA OD #1  (04.13.20), #2 (05.21.20), #3 (06.25.20), #4 (07.23.20), #5 (09.08.20)  **pt has been lost to f/u for 13 mos (04/11/2019 to 8.4.2021** had stroke in May  - OCT shows interval improvement in cystic changes; focal VMT along IT arcades -- no CME  - BCVA stable at 20/40 OD; was 20/200 for Dr. Marin Comment in April 2019  - BCVA likely limited by central retinal atrophy OD  - f/u 3-4 months -- DFE/OCT/ possible injection  3. Vitreomacular traction OD  - OCT today shows stable release of central VMT -- mild residual traction along IT arcaes  - monitor  4,5. Hypertensive retinopathy OU  - discussed importance of tight BP control  - monitor  6. 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  - discussed diagnosis and progression  - not yet visually significant  - monitor for now  Ophthalmic Meds Ordered this visit:  No orders of the defined types were placed in this encounter.     Return for f/u 3-4 months, inferior HRVO OD, DFE, OCT.  There are no Patient Instructions on file for this visit.  Explained the diagnoses, plan, and follow up with the patient and they expressed understanding.  Patient expressed understanding of the importance of proper follow up care.   This document serves as a record of services personally performed by Gardiner Sleeper, MD, PhD. It was created on their behalf by Roselee Nova, COMT. The creation of this record is the provider's dictation and/or activities during the visit.  Electronically signed by: Roselee Nova, COMT 03/06/20 12:48 PM  This document serves as a record of services personally performed by Gardiner Sleeper, MD, PhD. It was created on their behalf by San Jetty. Owens Shark, OA  an ophthalmic technician. The creation of this record is the provider's dictation and/or activities during the visit.    Electronically signed by: San Jetty. Owens Shark, [place] 08.04.2021 12:48 PM  Gardiner Sleeper, M.D., Ph.D. Diseases & Surgery of the Retina and Vitreous [facility]  I have reviewed the above documentation for accuracy and completeness, and I agree with the above. Gardiner Sleeper, M.D., Ph.D. 03/06/20 12:51 PM   Abbreviations: M myopia (nearsighted); A astigmatism; H hyperopia (farsighted); P presbyopia; Mrx spectacle prescription;  CTL contact lenses; OD right eye; OS left eye; OU both eyes  XT exotropia; ET esotropia; PEK punctate epithelial keratitis; PEE punctate epithelial erosions; DES dry eye syndrome; MGD meibomian gland dysfunction; ATs artificial tears; PFAT's preservative free artificial tears; [place] nuclear sclerotic cataract; PSC posterior subcapsular cataract; ERM epi-retinal membrane; PVD posterior vitreous detachment; RD retinal detachment; DM diabetes mellitus; DR diabetic retinopathy; NPDR non-proliferative diabetic retinopathy; PDR proliferative diabetic retinopathy; CSME clinically significant macular edema; DME diabetic macular edema; dbh dot blot hemorrhages; CWS cotton wool spot; POAG primary open angle glaucoma; C/D cup-to-disc ratio; HVF humphrey visual field; GVF goldmann visual field; OCT optical coherence tomography; IOP intraocular pressure; BRVO Branch retinal vein occlusion; CRVO central retinal vein occlusion; CRAO central retinal artery occlusion; BRAO branch retinal artery occlusion; RT retinal tear; SB scleral buckle; PPV pars plana vitrectomy; VH Vitreous hemorrhage; PRP panretinal laser photocoagulation; IVK intravitreal kenalog; VMT vitreomacular traction; MH Macular hole;  NVD neovascularization of the disc; NVE neovascularization elsewhere; AREDS age related eye disease study; ARMD age related macular degeneration; POAG primary open angle glaucoma; EBMD epithelial/anterior basement membrane dystrophy; ACIOL anterior chamber intraocular lens; IOL intraocular lens; PCIOL posterior chamber intraocular lens; Phaco/IOL phacoemulsification  with intraocular lens placement; [place] photorefractive keratectomy; LASIK laser assisted in situ keratomileusis; HTN hypertension; DM diabetes mellitus; COPD chronic obstructive pulmonary disease

## 2020-03-06 ENCOUNTER — Encounter (INDEPENDENT_AMBULATORY_CARE_PROVIDER_SITE_OTHER): Payer: Self-pay | Admitting: Ophthalmology

## 2020-03-06 ENCOUNTER — Other Ambulatory Visit: Payer: Self-pay

## 2020-03-06 ENCOUNTER — Ambulatory Visit (INDEPENDENT_AMBULATORY_CARE_PROVIDER_SITE_OTHER): Payer: Medicare HMO | Admitting: Ophthalmology

## 2020-03-06 DIAGNOSIS — H25813 Combined forms of age-related cataract, bilateral: Secondary | ICD-10-CM | POA: Diagnosis not present

## 2020-03-06 DIAGNOSIS — H3581 Retinal edema: Secondary | ICD-10-CM

## 2020-03-06 DIAGNOSIS — H43821 Vitreomacular adhesion, right eye: Secondary | ICD-10-CM

## 2020-03-06 DIAGNOSIS — I10 Essential (primary) hypertension: Secondary | ICD-10-CM

## 2020-03-06 DIAGNOSIS — H35033 Hypertensive retinopathy, bilateral: Secondary | ICD-10-CM | POA: Diagnosis not present

## 2020-03-06 DIAGNOSIS — H34831 Tributary (branch) retinal vein occlusion, right eye, with macular edema: Secondary | ICD-10-CM

## 2020-03-07 ENCOUNTER — Telehealth: Payer: Self-pay

## 2020-03-07 NOTE — Telephone Encounter (Signed)
[place] ,SLP from [facility] called stating missed appt this week, could not get in touch with patient. Also she is requesting HHST 1wk1. Patient never showed for 02/08/2020 appt, unable to give order. Asked to call PCP, gave name and number.

## 2020-03-11 ENCOUNTER — Telehealth: Payer: Self-pay

## 2020-03-11 NOTE — Telephone Encounter (Signed)
Okay to give order as requested. 

## 2020-03-11 NOTE — Telephone Encounter (Signed)
New message    [facility] calling for verbal order   ST one time a week for one week effective today.

## 2020-03-11 NOTE — Telephone Encounter (Signed)
Verbals given  

## 2020-04-01 ENCOUNTER — Ambulatory Visit: Payer: Medicare Other | Admitting: Family

## 2020-04-03 ENCOUNTER — Encounter (INDEPENDENT_AMBULATORY_CARE_PROVIDER_SITE_OTHER): Payer: Medicare Other | Admitting: Ophthalmology

## 2020-05-07 ENCOUNTER — Ambulatory Visit: Payer: Medicare HMO | Admitting: *Deleted

## 2020-05-07 ENCOUNTER — Ambulatory Visit: Payer: Medicare HMO | Attending: Critical Care Medicine

## 2020-05-07 ENCOUNTER — Ambulatory Visit: Payer: Medicare HMO

## 2020-05-07 DIAGNOSIS — Z23 Encounter for immunization: Secondary | ICD-10-CM

## 2020-05-07 NOTE — Progress Notes (Signed)
   Covid-19 Vaccination Clinic  Name:  Brandon Perkins    MRN: [id number] DOB: Jun 03, 1951  05/07/2020  Mr. Brandon Perkins was observed post Covid-19 immunization for 20 minutes without incident. He was provided with Vaccine Information Sheet and instruction to access the V-Safe system.   Mr. Brandon Perkins was instructed to call 911 with any severe reactions post vaccine: Marland Kitchen Difficulty breathing  . Swelling of face and throat  . A fast heartbeat  . A bad rash all over body  . Dizziness and weakness   Immunizations Administered    Name Date Dose VIS Date Route   Moderna COVID-19 Vaccine 05/07/2020  2:00 AM 0.5 mL 07/2019 Intramuscular   Manufacturer: Moderna   Lot: [id number]   NDC: 16967-893-81

## 2020-05-07 NOTE — Progress Notes (Signed)
Patient tolerated injection well today. Patient recieved water and was observed with no concerns.

## 2020-06-04 ENCOUNTER — Ambulatory Visit: Payer: Medicare HMO | Attending: Critical Care Medicine

## 2020-06-04 ENCOUNTER — Ambulatory Visit: Payer: Medicare HMO | Admitting: *Deleted

## 2020-06-04 DIAGNOSIS — Z23 Encounter for immunization: Secondary | ICD-10-CM

## 2020-06-04 NOTE — Progress Notes (Signed)
° °  Covid-19 Vaccination Clinic  Name:  Brandon Perkins    MRN: [id number] DOB: 03-Nov-1950  06/10/2020  Mr. Lewellen was observed post Covid-19 immunization for 15 minutes without incident. He was provided with Vaccine Information Sheet and instruction to access the V-Safe system.   Mr. Biedermann was instructed to call 911 with any severe reactions post vaccine: Difficulty breathing  Swelling of face and throat  A fast heartbeat  A bad rash all over body  Dizziness and weakness   Immunizations Administered     Name Date Dose VIS Date Route   Moderna COVID-19 Vaccine 06/04/2020  9:00 AM 0.5 mL 05/22/2020 Intramuscular   Manufacturer: Moderna   Lot: [id number]   NDC: 93112-162-44

## 2020-06-04 NOTE — Progress Notes (Signed)
Patient presented for 2hd dose. Patient reported no concerns with previous dose. Patient observed without any concerns.

## 2020-07-08 ENCOUNTER — Encounter (INDEPENDENT_AMBULATORY_CARE_PROVIDER_SITE_OTHER): Payer: Medicare Other | Admitting: Ophthalmology

## 2020-07-09 ENCOUNTER — Encounter (INDEPENDENT_AMBULATORY_CARE_PROVIDER_SITE_OTHER): Payer: Self-pay | Admitting: Ophthalmology

## 2020-07-09 ENCOUNTER — Ambulatory Visit (INDEPENDENT_AMBULATORY_CARE_PROVIDER_SITE_OTHER): Payer: Medicare HMO | Admitting: Ophthalmology

## 2020-07-09 ENCOUNTER — Other Ambulatory Visit: Payer: Self-pay

## 2020-07-09 DIAGNOSIS — I10 Essential (primary) hypertension: Secondary | ICD-10-CM | POA: Diagnosis not present

## 2020-07-09 DIAGNOSIS — H25813 Combined forms of age-related cataract, bilateral: Secondary | ICD-10-CM

## 2020-07-09 DIAGNOSIS — H3581 Retinal edema: Secondary | ICD-10-CM

## 2020-07-09 DIAGNOSIS — H35033 Hypertensive retinopathy, bilateral: Secondary | ICD-10-CM | POA: Diagnosis not present

## 2020-07-09 DIAGNOSIS — H43821 Vitreomacular adhesion, right eye: Secondary | ICD-10-CM

## 2020-07-09 DIAGNOSIS — H34831 Tributary (branch) retinal vein occlusion, right eye, with macular edema: Secondary | ICD-10-CM | POA: Diagnosis not present

## 2020-07-09 NOTE — Progress Notes (Signed)
[facility] Note  07/09/2020     CHIEF COMPLAINT Patient presents for Retina Follow Up   HISTORY OF PRESENT ILLNESS: Brandon Perkins is a 69 y.o. male who presents to the clinic today for:   HPI    Retina Follow Up    Patient presents with  CRVO/BRVO.  In right eye.  This started months ago.  Severity is moderate.  Duration of 4 months.  I, the attending physician,  performed the HPI with the patient and updated documentation appropriately.          Comments    69 y/o male pt here for 4 mo f/u for BRVO OD.  Good days and bad days with vision OU.  Denies pain, floaters.  Sees intermittent FOL OU.  No gtts.       Last edited by Bernarda Caffey, MD on 07/09/2020  1:16 PM. (History)      Referring physician: Marin Comment [address], [place],  [place] [address]  HISTORICAL INFORMATION:   Selected notes from the MEDICAL RECORD NUMBER Referred by Dr. Maryjane Hurter for concern of BRVO OD LEE: 04.29.19 (M.Le) [BCVA: OD: 20/400 OS: 20/20] Ocular Hx- PMH-hx of blood clots   CURRENT MEDICATIONS: No current outpatient medications on file. (Ophthalmic Drugs)   No current facility-administered medications for this visit. (Ophthalmic Drugs)   Current Outpatient Medications (Other)  Medication Sig  . amLODipine (NORVASC) 10 MG tablet Take 1 tablet (10 mg total) by mouth daily.  Marland Kitchen aspirin EC 81 MG tablet Take 1 tablet (81 mg total) by mouth daily.  Marland Kitchen atorvastatin (LIPITOR) 40 MG tablet Take 1 tablet (40 mg total) by mouth daily.  . chlorthalidone (HYGROTON) 25 MG tablet Take 1 tablet (25 mg total) by mouth daily.  . clopidogrel (PLAVIX) 75 MG tablet Take 1 tablet (75 mg total) by mouth daily.  Marland Kitchen lisinopril (ZESTRIL) 5 MG tablet Take 1 tablet (5 mg total) by mouth daily.  . QUEtiapine (SEROQUEL) 25 MG tablet Take 0.5 tablets (12.5 mg total) by mouth 3 times/day as needed-between meals & bedtime (for agitation, insomnia). (Patient not taking: Reported on  01/31/2020)  . sulfamethoxazole-trimethoprim (BACTRIM DS) 800-160 MG tablet Take 1 tablet by mouth 2 (two) times daily.   No current facility-administered medications for this visit. (Other)      REVIEW OF SYSTEMS: ROS    Positive for: Eyes   Negative for: Constitutional, Gastrointestinal, Neurological, Skin, Genitourinary, Musculoskeletal, HENT, Endocrine, Cardiovascular, Respiratory, Psychiatric, Allergic/Imm, Heme/Lymph   Last edited by Matthew Folks, COA on 07/09/2020 10:27 AM. (History)       ALLERGIES Allergies  Allergen Reactions  . Aspirin     When takes a lot of aspirin-- reaction unknown    PAST MEDICAL HISTORY Past Medical History:  Diagnosis Date  . Blood clot in vein    left arm vein blood clot, not know the detail, treated with ASA per pt  . Cataract    OU  . Hypertension   . Hypertensive retinopathy    OU   History reviewed. No pertinent surgical history.  FAMILY HISTORY Family History  Problem Relation Age of Onset  . Dementia Mother   . Colon cancer Father   . Stroke Sister        Sister had brain aneurysm  . Diabetes Brother     SOCIAL HISTORY Social History   Tobacco Use  . Smoking status: Never Smoker  . Smokeless tobacco: Never Used  Vaping Use  . Vaping Use: Never used  Substance Use Topics  . Alcohol use: No  . Drug use: No         OPHTHALMIC EXAM:  Base Eye Exam    Visual Acuity (Snellen - Linear)      Right Left   Dist [place] 20/50 + 20/25 -2   Dist ph [place] NI 20/25 +       Tonometry (Tonopen, 10:29 AM)      Right Left   Pressure 17 19       Pupils      Dark Light Shape React APD   Right 3 2 Round Slow None   Left 3 2 Round Slow None       Visual Fields (Counting fingers)      Left Right    Full Full       Extraocular Movement      Right Left    Full, Ortho Full, Ortho       Neuro/Psych    Oriented x3: Yes   Mood/Affect: Normal       Dilation    Both eyes: 1.0% Mydriacyl, 2.5% Phenylephrine @ 10:33  AM        Slit Lamp and Fundus Exam    Slit Lamp Exam      Right Left   Lids/Lashes Dermatochalasis - upper lid, mild Meibomian gland dysfunction Dermatochalasis - upper lid, mild Meibomian gland dysfunction   Conjunctiva/Sclera mild nasal Pinguecula, mild Melanosis mild nasal Pinguecula, mild Melanosis   Cornea mild Arcus, trace Punctate epithelial erosions, mild Debris in tear film mild Arcus, 1+ inferior Punctate epithelial erosions, focal area of irregular epi at 0730   Anterior Chamber Deep and quiet, narrow angles Deep and quiet, narrow angles   Iris Round and dilated, No NVI Round and dilated, No NVI   Lens 2-3+ Nuclear sclerosis, 2-3+ Cortical cataract, +Vacuoles 2-3+ Nuclear sclerosis, 2-3+ Cortical cataract   Vitreous Vitreous syneresis, tr white vitreous condensations inferior--clearing Vitreous syneresis       Fundus Exam      Right Left   Disc Pink and Sharp, +vascular loops, +cupping Pink and Sharp   C/D Ratio 0.6 0.5   Macula Flat, Blunted foveal reflex, scattered Atrophy, Exudates, +IRH and telangiectatic vessels - improved, interval improvement in cystic changes Flat, Good foveal reflex, Retinal pigment epithelial mottling and clumping, CWS SN mac, No heme or edema   Vessels attenuated, Copper wiring, scattered plaques and sheathing attenuated, Tortuous, mild Copper wiring, AV crossing changes   Periphery Attached, scattered IRH inferior hemisphere -- improved, mild White without pressure superiorly, good PRP inferior hemisphere Attached, +DBH nasal midzone          IMAGING AND PROCEDURES  Imaging and Procedures for _0 @  OCT, Retina - OU - Both Eyes       Right Eye Quality was good. Central Foveal Thickness: 240. Progression has been stable. Findings include no SRF, outer retinal atrophy, epiretinal membrane, normal foveal contour, no IRF, subretinal hyper-reflective material (stable improvement in cystic changes; focal VMT along IT arcades).   Left  Eye Quality was good. Central Foveal Thickness: 279. Progression has been stable. Findings include normal foveal contour, no SRF, no IRF (Partial PVD).   Notes *Images captured and stored on drive  Diagnosis / Impression:  OD: stable improvement in cystic changes; focal VMT along IT arcades OS: NFP, no IRF/SRF--stable   Clinical management:  See below  Abbreviations: NFP - Normal foveal profile. CME - cystoid  macular edema. PED - pigment epithelial detachment. IRF - intraretinal fluid. SRF - subretinal fluid. EZ - ellipsoid zone. ERM - epiretinal membrane. ORA - outer retinal atrophy. ORT - outer retinal tubulation. SRHM - subretinal hyper-reflective material                 ASSESSMENT/PLAN:    ICD-10-CM   1. Branch retinal vein occlusion of right eye with macular edema  H34.8310   2. 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 Essential hypertension  I10   5. Hypertensive retinopathy of both eyes  H35.033   6. Combined forms of age-related cataract of both eyes  H25.813     1,2. Remote, inferior HRVO OD  - originally referred by Dr. Marin Comment in April 2019, but pt never presented for consult  - exam shows diffuse macular atrophy, +collateral vessels on the disc, scattered telangectatic vessels inferiorly  - FA (02.25.20)  shows delayed filling, vascular perfusion defects, late leakage  - s/p sectoral PRP OD to inferior hemisphere (02.25.20)  - s/p IVA OD #1 (04.13.20), #2 (05.21.20), #3 (06.25.20), #4 (07.23.20), #5 (09.08.20)  **pt was been lost to f/u for 13 mos (09.08.20 to 08.04.21)--had stroke in May  - OCT shows interval improvement in cystic changes; focal VMT along IT arcades -- no CME  - BCVA slightly decreased to 20/50 from 20/40 OD; was 20/200 for Dr. Marin Comment in April 2019  - BCVA likely limited by central retinal atrophy OD and mild cataract progression  - f/u 4  months -- DFE/OCT/ possible injection  3.  Vitreomacular traction OD  - OCT today shows stable release of central VMT -- mild residual traction along IT arcaes  - monitor  4,5. Hypertensive retinopathy OU  - discussed importance of tight BP control  - monitor  6. 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  - discussed diagnosis and progression  - not yet visually significant  - monitor for now  Ophthalmic Meds Ordered this visit:  No orders of the defined types were placed in this encounter.     Return in about 4 months (around 11/07/2020) for f/u 4 months, HRVO OD, DFE, OCT.  There are no Patient Instructions on file for this visit.  Explained the diagnoses, plan, and follow up with the patient and they expressed understanding.  Patient expressed understanding of the importance of proper follow up care.   This document serves as a record of services personally performed by Gardiner Sleeper, MD, PhD. It was created on their behalf by Estill Bakes, COT an ophthalmic technician. The creation of this record is the provider's dictation and/or activities during the visit.    Electronically signed by: Estill Bakes, COT 12.7.21 @ 1:25 PM   This document serves as a record of services personally performed by Gardiner Sleeper, MD, PhD. It was created on their behalf by San Jetty. Owens Shark, OA an ophthalmic technician. The creation of this record is the provider's dictation and/or activities during the visit.    Electronically signed by: San Jetty. Owens Shark, OA 12.07.2021 1:25 PM  Gardiner Sleeper, M.D., Ph.D. Diseases & Surgery of the Retina and [place] 07/09/2020   I have reviewed the above documentation for accuracy and completeness, and I agree with the above. Gardiner Sleeper, M.D., Ph.D.  07/09/20 1:25 PM   Abbreviations: M myopia (nearsighted); A astigmatism; H hyperopia (farsighted); P presbyopia; Mrx  spectacle prescription;  CTL contact lenses; OD right eye; OS left eye; OU both eyes  XT exotropia; ET esotropia; PEK punctate epithelial keratitis; PEE punctate epithelial erosions; DES dry eye syndrome; MGD meibomian gland dysfunction; ATs artificial tears; PFAT's preservative free artificial tears; [place] nuclear sclerotic cataract; PSC posterior subcapsular cataract; ERM epi-retinal membrane; PVD posterior vitreous detachment; RD retinal detachment; DM diabetes mellitus; DR diabetic retinopathy; NPDR non-proliferative diabetic retinopathy; PDR proliferative diabetic retinopathy; CSME clinically significant macular edema; DME diabetic macular edema; dbh dot blot hemorrhages; CWS cotton wool spot; POAG primary open angle glaucoma; C/D cup-to-disc ratio; HVF humphrey visual field; GVF goldmann visual field; OCT optical coherence tomography; IOP intraocular pressure; BRVO Branch retinal vein occlusion; CRVO central retinal vein occlusion; CRAO central retinal artery occlusion; BRAO branch retinal artery occlusion; RT retinal tear; SB scleral buckle; PPV pars plana vitrectomy; VH Vitreous hemorrhage; PRP panretinal laser photocoagulation; IVK intravitreal kenalog; VMT vitreomacular traction; MH Macular hole;  NVD neovascularization of the disc; NVE neovascularization elsewhere; AREDS age related eye disease study; ARMD age related macular degeneration; POAG primary open angle glaucoma; EBMD epithelial/anterior basement membrane dystrophy; ACIOL anterior chamber intraocular lens; IOL intraocular lens; PCIOL posterior chamber intraocular lens; Phaco/IOL phacoemulsification with intraocular lens placement; [place] photorefractive keratectomy; LASIK laser assisted in situ keratomileusis; HTN hypertension; DM diabetes mellitus; COPD chronic obstructive pulmonary disease

## 2020-08-09 ENCOUNTER — Ambulatory Visit: Payer: Medicare HMO | Admitting: Family

## 2020-08-09 DIAGNOSIS — Z0289 Encounter for other administrative examinations: Secondary | ICD-10-CM

## 2020-10-01 ENCOUNTER — Ambulatory Visit: Payer: Medicare (Managed Care) | Attending: Critical Care Medicine

## 2020-10-01 ENCOUNTER — Ambulatory Visit: Payer: Medicare (Managed Care) | Admitting: *Deleted

## 2020-10-01 DIAGNOSIS — Z23 Encounter for immunization: Secondary | ICD-10-CM

## 2020-10-01 NOTE — Progress Notes (Signed)
   Covid-19 Vaccination Clinic  Name:  SHAMEEK NYQUIST    MRN: [id number] DOB: [birth year]  10/01/2020  Mr. Stocks was observed post Covid-19 immunization for 15 minutes without incident. He was provided with Vaccine Information Sheet and instruction to access the V-Safe system.   Mr. Moncus was instructed to call 911 with any severe reactions post vaccine: Marland Kitchen Difficulty breathing  . Swelling of face and throat  . A fast heartbeat  . A bad rash all over body  . Dizziness and weakness   Immunizations Administered    Name Date Dose VIS Date Route   Moderna Covid-19 Booster Vaccine 10/01/2020  2:40 PM 0.25 mL 05/22/2020 Intramuscular   Manufacturer: Gala Murdoch   Lot: [id number]   NDC: 86767-209-47   Moderna Covid-19 Booster Vaccine 10/01/2020  2:40 PM 0.25 mL 05/22/2020 Intramuscular   Manufacturer: Moderna   Lot: [id number]   NDC: 62947-654-65

## 2020-10-01 NOTE — Progress Notes (Signed)
Patient presents for vaccine injection today. Patient tolerated injection well and was observed without any concerns.  

## 2020-11-05 NOTE — Progress Notes (Shared)
[facility] - Clinic Note  11/07/2020     CHIEF COMPLAINT Patient presents for No chief complaint on file.   HISTORY OF PRESENT ILLNESS: Brandon Perkins is a 70 y.o. male who presents to the clinic today for:     Referring physician: Olive Bass, FNP [address] [place],  [place] [address]  HISTORICAL INFORMATION:   Selected notes from the MEDICAL RECORD NUMBER Referred by Dr. Aletta Edouard for concern of BRVO OD LEE: 04.29.19 (M.Le) [BCVA: OD: 20/400 OS: 20/20] Ocular Hx- PMH-hx of blood clots   CURRENT MEDICATIONS: No current outpatient medications on file. (Ophthalmic Drugs)   No current facility-administered medications for this visit. (Ophthalmic Drugs)   Current Outpatient Medications (Other)  Medication Sig  . amLODipine (NORVASC) 10 MG tablet Take 1 tablet (10 mg total) by mouth daily.  Marland Kitchen aspirin EC 81 MG tablet Take 1 tablet (81 mg total) by mouth daily.  Marland Kitchen atorvastatin (LIPITOR) 40 MG tablet Take 1 tablet (40 mg total) by mouth daily.  . chlorthalidone (HYGROTON) 25 MG tablet Take 1 tablet (25 mg total) by mouth daily.  . clopidogrel (PLAVIX) 75 MG tablet Take 1 tablet (75 mg total) by mouth daily.  Marland Kitchen lisinopril (ZESTRIL) 5 MG tablet Take 1 tablet (5 mg total) by mouth daily.  . QUEtiapine (SEROQUEL) 25 MG tablet Take 0.5 tablets (12.5 mg total) by mouth 3 times/day as needed-between meals & bedtime (for agitation, insomnia). (Patient not taking: Reported on 01/31/2020)  . sulfamethoxazole-trimethoprim (BACTRIM DS) 800-160 MG tablet Take 1 tablet by mouth 2 (two) times daily.   No current facility-administered medications for this visit. (Other)      REVIEW OF SYSTEMS:    ALLERGIES Allergies  Allergen Reactions  . Aspirin     When takes a lot of aspirin-- reaction unknown    PAST MEDICAL HISTORY Past Medical History:  Diagnosis Date  . Blood clot in vein    left arm vein blood clot, not know the detail, treated with ASA  per pt  . Cataract    OU  . Hypertension   . Hypertensive retinopathy    OU   No past surgical history on file.  FAMILY HISTORY Family History  Problem Relation Age of Onset  . Dementia Mother   . Colon cancer Father   . Stroke Sister        Sister had brain aneurysm  . Diabetes Brother     SOCIAL HISTORY Social History   Tobacco Use  . Smoking status: Never Smoker  . Smokeless tobacco: Never Used  Vaping Use  . Vaping Use: Never used  Substance Use Topics  . Alcohol use: No  . Drug use: No         OPHTHALMIC EXAM:  Not recorded     IMAGING AND PROCEDURES  Imaging and Procedures for @TODAY @           ASSESSMENT/PLAN:  No diagnosis found.  1,2. Remote, inferior HRVO OD  - originally referred by Dr. in April 2019, but pt never presented for consult  - exam shows diffuse macular atrophy, +collateral vessels on the disc, scattered telangectatic vessels inferiorly  - FA (02.25.20)  shows delayed filling, vascular perfusion defects, late leakage  - s/p sectoral PRP OD to inferior hemisphere (02.25.20)  - s/p IVA OD #1 (04.13.20), #2 (05.21.20), #3 (06.25.20), #4 (07.23.20), #5 (09.08.20)  **pt was been lost to f/u for 13 mos (09.08.20 to 08.04.21)--had stroke in  May  - OCT shows interval improvement in cystic changes; focal VMT along IT arcades -- no CME  - BCVA slightly decreased to 20/50 from 20/40 OD; was 20/200 for Dr. Conley Rolls in April 2019  - BCVA likely limited by central retinal atrophy OD and mild cataract progression  - f/u 4  months -- DFE/OCT/ possible injection  3. Vitreomacular traction OD  - OCT today shows stable release of central VMT -- mild residual traction along IT arcaes  - monitor  4,5. Hypertensive retinopathy OU  - discussed importance of tight BP control  - monitor  6. 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  - discussed diagnosis  and progression  - not yet visually significant  - monitor for now  Ophthalmic Meds Ordered this visit:  No orders of the defined types were placed in this encounter.     No follow-ups on file.  There are no Patient Instructions on file for this visit.  Explained the diagnoses, plan, and follow up with the patient and they expressed understanding.  Patient expressed understanding of the importance of proper follow up care.   This document serves as a record of services personally performed by Karie Chimera, MD, PhD. It was created on their behalf by Cristopher Estimable, COT an ophthalmic technician. The creation of this record is the provider's dictation and/or activities during the visit.    Electronically signed by: Cristopher Estimable, COT 4.5.22 @ 1:11 PM  Abbreviations: M myopia (nearsighted); A astigmatism; H hyperopia (farsighted); P presbyopia; Mrx spectacle prescription;  CTL contact lenses; OD right eye; OS left eye; OU both eyes  XT exotropia; ET esotropia; PEK punctate epithelial keratitis; PEE punctate epithelial erosions; DES dry eye syndrome; MGD meibomian gland dysfunction; ATs artificial tears; PFAT's preservative free artificial tears; [place] nuclear sclerotic cataract; PSC posterior subcapsular cataract; ERM epi-retinal membrane; PVD posterior vitreous detachment; RD retinal detachment; DM diabetes mellitus; DR diabetic retinopathy; NPDR non-proliferative diabetic retinopathy; PDR proliferative diabetic retinopathy; CSME clinically significant macular edema; DME diabetic macular edema; dbh dot blot hemorrhages; CWS cotton wool spot; POAG primary open angle glaucoma; C/D cup-to-disc ratio; HVF humphrey visual field; GVF goldmann visual field; OCT optical coherence tomography; IOP intraocular pressure; BRVO Branch retinal vein occlusion; CRVO central retinal vein occlusion; CRAO central retinal artery occlusion; BRAO branch retinal artery occlusion; RT retinal tear; SB scleral buckle; PPV  pars plana vitrectomy; VH Vitreous hemorrhage; PRP panretinal laser photocoagulation; IVK intravitreal kenalog; VMT vitreomacular traction; MH Macular hole;  NVD neovascularization of the disc; NVE neovascularization elsewhere; AREDS age related eye disease study; ARMD age related macular degeneration; POAG primary open angle glaucoma; EBMD epithelial/anterior basement membrane dystrophy; ACIOL anterior chamber intraocular lens; IOL intraocular lens; PCIOL posterior chamber intraocular lens; Phaco/IOL phacoemulsification with intraocular lens placement; PRK photorefractive keratectomy; LASIK laser assisted in situ keratomileusis; HTN hypertension; DM diabetes mellitus; COPD chronic obstructive pulmonary disease

## 2020-11-07 ENCOUNTER — Encounter (INDEPENDENT_AMBULATORY_CARE_PROVIDER_SITE_OTHER): Payer: Medicare (Managed Care) | Admitting: Ophthalmology

## 2020-11-07 ENCOUNTER — Encounter (INDEPENDENT_AMBULATORY_CARE_PROVIDER_SITE_OTHER): Payer: Medicare HMO | Admitting: Ophthalmology

## 2020-11-07 DIAGNOSIS — I10 Essential (primary) hypertension: Secondary | ICD-10-CM

## 2020-11-07 DIAGNOSIS — H34831 Tributary (branch) retinal vein occlusion, right eye, with macular edema: Secondary | ICD-10-CM

## 2020-11-07 DIAGNOSIS — H35033 Hypertensive retinopathy, bilateral: Secondary | ICD-10-CM

## 2020-11-07 DIAGNOSIS — H43821 Vitreomacular adhesion, right eye: Secondary | ICD-10-CM

## 2020-11-07 DIAGNOSIS — H3581 Retinal edema: Secondary | ICD-10-CM

## 2020-11-07 DIAGNOSIS — H25813 Combined forms of age-related cataract, bilateral: Secondary | ICD-10-CM

## 2020-12-20 ENCOUNTER — Encounter: Payer: Medicare (Managed Care) | Admitting: Family

## 2021-01-14 ENCOUNTER — Other Ambulatory Visit: Payer: Self-pay

## 2021-01-14 ENCOUNTER — Encounter: Payer: Self-pay | Admitting: Family

## 2021-01-14 ENCOUNTER — Ambulatory Visit (INDEPENDENT_AMBULATORY_CARE_PROVIDER_SITE_OTHER): Payer: Medicare Other | Admitting: Family

## 2021-01-14 VITALS — BP 138/70 | HR 60 | Temp 98.0°F | Ht 74.0 in | Wt 177.8 lb

## 2021-01-14 DIAGNOSIS — R7309 Other abnormal glucose: Secondary | ICD-10-CM

## 2021-01-14 DIAGNOSIS — Z125 Encounter for screening for malignant neoplasm of prostate: Secondary | ICD-10-CM

## 2021-01-14 DIAGNOSIS — I10 Essential (primary) hypertension: Secondary | ICD-10-CM | POA: Diagnosis not present

## 2021-01-14 DIAGNOSIS — Z13220 Encounter for screening for lipoid disorders: Secondary | ICD-10-CM

## 2021-01-14 DIAGNOSIS — Z0000 Encounter for general adult medical examination without abnormal findings: Secondary | ICD-10-CM

## 2021-01-14 DIAGNOSIS — Z1211 Encounter for screening for malignant neoplasm of colon: Secondary | ICD-10-CM | POA: Diagnosis not present

## 2021-01-14 LAB — COMPREHENSIVE METABOLIC PANEL
ALT: 13 U/L (ref 0–53)
AST: 17 U/L (ref 0–37)
Albumin: 4.4 g/dL (ref 3.5–5.2)
Alkaline Phosphatase: 89 U/L (ref 39–117)
BUN: 31 mg/dL — ABNORMAL HIGH (ref 6–23)
CO2: 28 mEq/L (ref 19–32)
Calcium: 9.2 mg/dL (ref 8.4–10.5)
Chloride: 100 mEq/L (ref 96–112)
Creatinine, Ser: 1.57 mg/dL — ABNORMAL HIGH (ref 0.40–1.50)
GFR: 45.08 mL/min — ABNORMAL LOW (ref >60.00)
Glucose, Bld: 116 mg/dL — ABNORMAL HIGH (ref 70–99)
Potassium: 4.2 mEq/L (ref 3.5–5.1)
Sodium: 137 mEq/L (ref 135–145)
Total Bilirubin: 0.5 mg/dL (ref 0.2–1.2)
Total Protein: 7.9 g/dL (ref 6.0–8.3)

## 2021-01-14 LAB — CBC WITH DIFFERENTIAL/PLATELET
Basophils Absolute: 0 10*3/uL (ref 0.0–0.1)
Basophils Relative: 0.8 % (ref 0.0–3.0)
Eosinophils Absolute: 0.2 10*3/uL (ref 0.0–0.7)
Eosinophils Relative: 4.4 % (ref 0.0–5.0)
HCT: 36.6 % — ABNORMAL LOW (ref 39.0–52.0)
Hemoglobin: 11.8 g/dL — ABNORMAL LOW (ref 13.0–17.0)
Lymphocytes Relative: 32.4 % (ref 12.0–46.0)
Lymphs Abs: 1.7 10*3/uL (ref 0.7–4.0)
MCHC: 32.2 g/dL (ref 30.0–36.0)
MCV: 83 fl (ref 78.0–100.0)
Monocytes Absolute: 0.5 10*3/uL (ref 0.1–1.0)
Monocytes Relative: 9.9 % (ref 3.0–12.0)
Neutro Abs: 2.7 10*3/uL (ref 1.4–7.7)
Neutrophils Relative %: 52.5 % (ref 43.0–77.0)
Platelets: 238 10*3/uL (ref 150.0–400.0)
RBC: 4.41 Mil/uL (ref 4.22–5.81)
RDW: 15.7 % — ABNORMAL HIGH (ref 11.5–15.5)
WBC: 5.2 10*3/uL (ref 4.0–10.5)

## 2021-01-14 LAB — HEMOGLOBIN A1C: Hgb A1c MFr Bld: 6.8 % — ABNORMAL HIGH (ref 4.6–6.5)

## 2021-01-14 LAB — LIPID PANEL
Cholesterol: 138 mg/dL (ref 0–200)
HDL: 43.5 mg/dL (ref >39.00)
LDL Cholesterol: 84 mg/dL (ref 0–99)
NonHDL: 94.98
Total CHOL/HDL Ratio: 3
Triglycerides: 54 mg/dL (ref 0.0–149.0)
VLDL: 10.8 mg/dL (ref 0.0–40.0)

## 2021-01-14 LAB — PSA: PSA: 4.46 ng/mL — ABNORMAL HIGH (ref 0.10–4.00)

## 2021-01-14 LAB — TSH: TSH: 0.85 u[IU]/mL (ref 0.35–4.50)

## 2021-01-14 MED ORDER — AMLODIPINE BESYLATE 10 MG PO TABS: 10.0000 mg | ORAL_TABLET | Freq: Every day | ORAL | 3 refills | Status: DC

## 2021-01-14 MED ORDER — ATORVASTATIN CALCIUM 40 MG PO TABS: 40.0000 mg | ORAL_TABLET | Freq: Every day | ORAL | 3 refills | Status: DC

## 2021-01-14 MED ORDER — LISINOPRIL 5 MG PO TABS: 5.0000 mg | ORAL_TABLET | Freq: Every day | ORAL | 3 refills | Status: DC

## 2021-01-14 MED ORDER — CLOPIDOGREL BISULFATE 75 MG PO TABS: 75.0000 mg | ORAL_TABLET | Freq: Every day | ORAL | 3 refills | Status: DC

## 2021-01-14 MED ORDER — CHLORTHALIDONE 25 MG PO TABS: 25.0000 mg | ORAL_TABLET | Freq: Every day | ORAL | 3 refills | Status: DC

## 2021-01-14 NOTE — Progress Notes (Signed)
Brandon Perkins is a 70 y.o. 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 90 tablet 3   atorvastatin (LIPITOR) 40 MG tablet Take 1 tablet (40 mg total) by mouth daily. 90 tablet 3   chlorthalidone (HYGROTON) 25 MG tablet Take 1 tablet (25 mg total) by mouth daily. 90 tablet 3   clopidogrel (PLAVIX) 75 MG tablet Take 1 tablet (75 mg total) by mouth daily. 90 tablet 3   lisinopril (ZESTRIL) 5 MG tablet Take 1 tablet (5 mg total) by mouth daily. 90 tablet 3   No current facility-administered medications for this visit.    Allergies: Aspirin  Past Medical History:  Diagnosis Date   Blood clot in vein    left arm vein blood clot, not know the detail, treated with ASA per pt   Cataract    OU   Hypertension    Hypertensive retinopathy    OU    No past surgical history on file.  Family History  Problem Relation Age of Onset   Dementia Mother    Colon cancer Father    Stroke Sister        Sister had brain aneurysm   Diabetes Brother     Social History   Tobacco Use   Smoking status: Never   Smokeless tobacco: Never  Substance Use Topics   Alcohol use: No    Subjective:  Presents for yearly CPE; has not been seen since June 2021- unfortunately has no-showed numerous follow ups;  Medication review-  unsure if he is taking daily Aspirin everyday; he was due to transition from Plavix to Aspirin but has Aspirin listed as an allergy; he has not been taking aspirin this past year and prefers not to take at this time;   He defers updating any type of vaccines today;  He does not want to do colonoscopy- he does agree to [employer];   Review of Systems  Constitutional: Negative.   HENT: Negative.    Eyes: Negative.   Respiratory: Negative.    Cardiovascular: Negative.   Gastrointestinal: Negative.   Genitourinary: Negative.   Musculoskeletal: Negative.   Skin: Negative.       Objective:  Vitals:   01/14/21 0815  BP: 138/70  Pulse: 60  Temp: 98 F (36.7 C)  SpO2: 95%  Weight: 177 lb 12.8 oz (80.6 kg)  Height: 6' 2"  (1.88 m)    General: Well developed, well nourished, in no acute distress  Skin : Warm and dry.  Head: Normocephalic and atraumatic  Eyes: Sclera and conjunctiva clear; pupils round and reactive to light; extraocular movements intact  Ears: External normal; canals clear; tympanic membranes normal  Oropharynx: Pink, supple. No suspicious lesions  Neck: Supple without thyromegaly, adenopathy  Lungs: Respirations unlabored; clear to auscultation bilaterally without wheeze, rales, rhonchi  CVS exam: normal rate and regular rhythm.  Abdomen: Soft; nontender; nondistended; normoactive bowel sounds; no masses or hepatosplenomegaly  Musculoskeletal: No deformities; no active joint inflammation  Extremities: No edema, cyanosis, clubbing  Vessels: Symmetric bilaterally  Neurologic: Alert and oriented; speech intact; face symmetrical; moves all extremities well; CNII-XII intact without focal deficit   Assessment:  1. PE (physical exam), annual   2. Lipid screening   3. Prostate cancer screening   4. Elevated glucose   5. Essential hypertension   6. Colon cancer screening     Plan:  Age appropriate preventive healthcare needs addressed; encouraged regular eye doctor and  dental exams; encouraged regular exercise; will update labs and refills as needed today; follow-up to be determined; He defers any type of vaccines today. Order for Cologuard updated as well;  Will refer to cardiology to evaluate for possible arrhythmia;  He will most likely plan to transfer to new PCP at [facility] as this location is not convenient for him or his transportation options.  This visit occurred during the SARS-CoV-2 public health emergency.  Safety protocols were in place, including screening questions prior to the visit, additional usage of staff PPE, and extensive cleaning of exam room while observing appropriate contact time as indicated for disinfecting solutions.    No follow-ups on file.  Orders Placed This Encounter  Procedures   CBC with Differential/Platelet   Comp Met (CMET)   Lipid panel   TSH   PSA   Hemoglobin A1c   Cologuard    Standing Status:   Future    Standing Expiration Date:   01/14/2022   Ambulatory referral to Cardiology    Referral Priority:   Routine    Referral Type:   Consultation    Referral Reason:   Specialty Services Required    Requested Specialty:   Cardiology    Number of Visits Requested:   1    Requested Prescriptions   Signed Prescriptions Disp Refills   amLODipine (NORVASC) 10 MG tablet 90 tablet 3    Sig: Take 1 tablet (10 mg total) by mouth daily.   atorvastatin (LIPITOR) 40 MG tablet 90 tablet 3    Sig: Take 1 tablet (40 mg total) by mouth daily.   chlorthalidone (HYGROTON) 25 MG tablet 90 tablet 3    Sig: Take 1 tablet (25 mg total) by mouth daily.   clopidogrel (PLAVIX) 75 MG tablet 90 tablet 3    Sig: Take 1 tablet (75 mg total) by mouth daily.   lisinopril (ZESTRIL) 5 MG tablet 90 tablet 3    Sig: Take 1 tablet (5 mg total) by mouth daily.

## 2021-01-17 ENCOUNTER — Other Ambulatory Visit: Payer: Self-pay | Admitting: Family

## 2021-01-17 DIAGNOSIS — R972 Elevated prostate specific antigen [PSA]: Secondary | ICD-10-CM

## 2021-01-17 MED ORDER — DOXYCYCLINE HYCLATE 100 MG PO TABS: 100.0000 mg | ORAL_TABLET | Freq: Two times a day (BID) | ORAL | 0 refills | Status: DC

## 2021-01-28 ENCOUNTER — Other Ambulatory Visit: Payer: Self-pay | Admitting: Family

## 2021-01-29 ENCOUNTER — Other Ambulatory Visit: Payer: Self-pay | Admitting: Family

## 2021-02-05 DIAGNOSIS — H259 Unspecified age-related cataract: Secondary | ICD-10-CM | POA: Diagnosis not present

## 2021-03-18 NOTE — Progress Notes (Deleted)
Cardiology Office Note:    Date:  03/18/2021   ID:  Brandon Perkins, DOB June 30, 1951, MRN [id number]  PCP:  Olive Bass, FNP   [facility] Providers Cardiologist:  None { Click to update primary MD,subspecialty MD or APP then REFRESH:1}    Referring MD: Olive Bass,*   CC: *** Consulted for the evaluation of arrhythmia r/u in the setting of prior stroke at the behest of Olive Bass, FNP   History of Present Illness:    Brandon Perkins is a 70 y.o. male with a hx of HTN with [facility] emergency and prior history of stroke who presents 03/19/21.  Patient notes that he is feeling ***.  Has had no chest pain, chest pressure, chest tightness, chest stinging ***.  Discomfort occurs with ***, worsens with ***, and improves with ***.  Patient exertion notable for *** with *** and feels no symptoms.  No shortness of breath, DOE ***.  No PND or orthopnea***.  No weight gain***, leg swelling ***, or abdominal swelling***.  No syncope or near syncope ***. Notes *** no palpitations or funny heart beats.   No leg claudication.  Patient reports prior cardiac testing including *** echo, *** stress test, *** heart catheterizations, *** cardioversion, *** ablations.  No history of ***pre-eclampsia, gestation HTN or gestational DM.  No Fen-Phen or drug use***.  Ambulatory BP ***.   Past Medical History:  Diagnosis Date   Blood clot in vein    left arm vein blood clot, not know the detail, treated with ASA per pt   Cataract    OU   Hypertension    Hypertensive retinopathy    OU    No past surgical history on file.  Current Medications: No outpatient medications have been marked as taking for the 03/19/21 encounter (Appointment) with Christell Constant, MD.     Allergies:   Aspirin   Social History   Socioeconomic History   Marital status: Married    Spouse name: Not on file   Number of children: Not on file   Years of education: Not on file   Highest  education level: Not on file  Occupational History   Not on file  Tobacco Use   Smoking status: Never   Smokeless tobacco: Never  Vaping Use   Vaping Use: Never used  Substance and Sexual Activity   Alcohol use: No   Drug use: No   Sexual activity: Not on file  Other Topics Concern   Not on file  Social History Narrative   Not on file   Social Determinants of Health   Financial Resource Strain: Not on file  Food Insecurity: Not on file  Transportation Needs: Not on file  Physical Activity: Not on file  Stress: Not on file  Social Connections: Not on file     Family History: The patient's ***family history includes Colon cancer in his father; Dementia in his mother; Diabetes in his brother; Stroke in his sister.  ROS:   Please see the history of present illness.    *** All other systems reviewed and are negative.  EKGs/Labs/Other Studies Reviewed:    The following studies were reviewed today: ***  EKG:  EKG is *** ordered today.  The ekg ordered today demonstrates *** 03/19/21: ***  Transthoracic Echocardiogram: Date: 12/25/20 Results:  1. Left ventricular ejection fraction, by estimation, is 55 to 60%. The  left ventricle has normal function. The left ventricle has no regional  wall motion  abnormalities. There is severe left ventricular hypertrophy.  Left ventricular diastolic parameters   are consistent with Grade I diastolic dysfunction (impaired relaxation).  Consider cardiac amyloidosis.   2. Right ventricular systolic function is normal. The right ventricular  size is normal.   3. The mitral valve is normal in structure. No evidence of mitral valve  regurgitation. No evidence of mitral stenosis.   4. The aortic valve is tricuspid. Aortic valve regurgitation is mild.  Mild aortic valve sclerosis is present, with no evidence of aortic valve  stenosis.   5. Peak RV-RA gradient 19 mmHg.   6. The IVC was not visualized.    NonCardiac CT: Date:  12/24/19 Results: Aortic Atherosclerosis    Recent Labs: 01/14/2021: ALT 13; BUN 31; Creatinine, Ser 1.57; Hemoglobin 11.8; Platelets 238.0; Potassium 4.2; Sodium 137; TSH 0.85  Recent Lipid Panel    Component Value Date/Time   CHOL 138 01/14/2021 0902   TRIG 54.0 01/14/2021 0902   HDL 43.50 01/14/2021 0902   CHOLHDL 3 01/14/2021 0902   VLDL 10.8 01/14/2021 0902   LDLCALC 84 01/14/2021 0902     Risk Assessment/Calculations:   {Does this patient have ATRIAL FIBRILLATION?:[phone]}       Physical Exam:    VS:  There were no vitals taken for this visit.    Wt Readings from Last 3 Encounters:  01/14/21 177 lb 12.8 oz (80.6 kg)  01/31/20 173 lb 6.4 oz (78.7 kg)  12/29/19 165 lb 12.6 oz (75.2 kg)     GEN: *** Well nourished, well developed in no acute distress HEENT: Normal NECK: No JVD; No carotid bruits LYMPHATICS: No lymphadenopathy CARDIAC: ***RRR, no murmurs, rubs, gallops RESPIRATORY:  Clear to auscultation without rales, wheezing or rhonchi  ABDOMEN: Soft, non-tender, non-distended MUSCULOSKELETAL:  No edema; No deformity  SKIN: Warm and dry NEUROLOGIC:  Alert and oriented x 3 PSYCHIATRIC:  Normal affect   ASSESSMENT:    No diagnosis found. PLAN:    HTN with prior HTN emergency Hx of CVA Aortic Atherosclerosis and HLD - *** [place]  Severe LVH ***   {Are you ordering a CV Procedure (e.g. stress test, cath, DCCV, TEE, etc)?   Press F2        :[id number]}    Medication Adjustments/Labs and Tests Ordered: Current medicines are reviewed at length with the patient today.  Concerns regarding medicines are outlined above.  No orders of the defined types were placed in this encounter.  No orders of the defined types were placed in this encounter.   There are no Patient Instructions on file for this visit.   Signed, Christell Constant, MD  03/18/2021 12:09 PM    [facility]

## 2021-03-19 ENCOUNTER — Ambulatory Visit: Payer: Medicare Other | Admitting: Internal Medicine

## 2021-04-09 DIAGNOSIS — H348312 Tributary (branch) retinal vein occlusion, right eye, stable: Secondary | ICD-10-CM | POA: Diagnosis not present

## 2021-04-09 DIAGNOSIS — H25812 Combined forms of age-related cataract, left eye: Secondary | ICD-10-CM | POA: Diagnosis not present

## 2021-04-09 DIAGNOSIS — Z01818 Encounter for other preprocedural examination: Secondary | ICD-10-CM | POA: Diagnosis not present

## 2021-04-09 DIAGNOSIS — H25811 Combined forms of age-related cataract, right eye: Secondary | ICD-10-CM | POA: Diagnosis not present

## 2021-04-18 DIAGNOSIS — H25811 Combined forms of age-related cataract, right eye: Secondary | ICD-10-CM | POA: Diagnosis not present

## 2021-04-18 DIAGNOSIS — H2511 Age-related nuclear cataract, right eye: Secondary | ICD-10-CM | POA: Diagnosis not present

## 2021-05-12 ENCOUNTER — Telehealth: Payer: Self-pay | Admitting: Family

## 2021-05-12 NOTE — Telephone Encounter (Signed)
Patient states he got an automated call stating he needed a colonoscopy, and was wondering when [place] wanted him to do it. Please advice.

## 2021-05-12 NOTE — Telephone Encounter (Signed)
I have called pt and informed him the we were going to the Cologuard kit to be mailed to his home. He will need to do the instructions on the box and mail it back with the preaddressed label

## 2021-06-04 ENCOUNTER — Ambulatory Visit: Payer: Medicare Other | Admitting: Interventional Cardiology

## 2021-06-04 ENCOUNTER — Other Ambulatory Visit: Payer: Self-pay

## 2021-06-04 ENCOUNTER — Encounter: Payer: Self-pay | Admitting: Interventional Cardiology

## 2021-06-04 VITALS — BP 126/78 | HR 57 | Ht 74.0 in | Wt 179.0 lb

## 2021-06-04 DIAGNOSIS — I6330 Cerebral infarction due to thrombosis of unspecified cerebral artery: Secondary | ICD-10-CM | POA: Diagnosis not present

## 2021-06-04 DIAGNOSIS — I10 Essential (primary) hypertension: Secondary | ICD-10-CM | POA: Diagnosis not present

## 2021-06-04 DIAGNOSIS — E782 Mixed hyperlipidemia: Secondary | ICD-10-CM | POA: Diagnosis not present

## 2021-06-04 DIAGNOSIS — I351 Nonrheumatic aortic (valve) insufficiency: Secondary | ICD-10-CM

## 2021-06-04 NOTE — Progress Notes (Signed)
Cardiology Office Note   Date:  06/04/2021   ID:  Brandon Perkins, DOB February 16, 1951, MRN [id number]  PCP:  Olive Bass, FNP    No chief complaint on file.  palpitations  Wt Readings from Last 3 Encounters:  06/04/21 179 lb (81.2 kg)  01/14/21 177 lb 12.8 oz (80.6 kg)  01/31/20 173 lb 6.4 oz (78.7 kg)       History of Present Illness: Brandon Perkins is a 70 y.o. male who is being seen today for the evaluation of arrhythmia at the request of Olive Bass,*.   He had a CVA in 12/2019.  Records show: " Presented 12/24/2019 with dysarthria and altered mental status.  Elevated blood pressure systolics in the 200s placed on Cleviprex.  Cranial CT scan unremarkable for acute intracranial process.  MRI of the brain positive for acute to subacute right superior cerebellar artery territory infarction.  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  Admission chemistries potassium 5.8 alcohol negative SARS coronavirus negative urinalysis negative nitrite.  Echocardiogram with ejection fraction of 60% grade 1 diastolic dysfunction.  Maintained on aspirin and Plavix x3 months and aspirin alone.  Subcutaneous Lovenox for DVT prophylaxis.  Therapy evaluations completed and patient was admitted for a comprehensive rehab program."  "He was also noted to be hypertensive with blood pressure more than 200.  In the emergency room, his blood pressures were more than 240.  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 Patient was admitted to intensive care unit with clevidipine infusion to control his blood pressure. 5/25, transition to oral antihypertensives and transferred to medical floor.   Acute thrombotic stroke: Suspect secondary to large vessel disease. Clinical findings, dysarthria and dysmetria.  Difficulty ambulating. CT head findings, right cerebral subacute  infarct.  Small vessel disease. MRI of the brain, right superior cerebellar artery territory infarct. CTA of the head and neck, moderate to severe stenosis multiple areas. 2D echocardiogram, normal ejection fraction.  Severe left ventricular hypertrophy consistent with chronic diastolic dysfunction.  No source of emboli. Antiplatelet therapy, not taking any treatment at home.  Started on aspirin and Plavix.  Plan is to continue aspirin and Plavix for 3 months then aspirin alone. LDL 128.  Started on Lipitor 40 mg. Hemoglobin A1c, 6.0.  No indication for treatment. Therapy recommendations, acute inpatient rehab.   Hypertensive emergency: With acute stroke.  He was admitted to the intensive care unit and treated with intravenous infusion.  Blood pressures more than 240 on arrival.  Probably not consistently taking home medications. Resumed on amlodipine 10 mg, chlorthalidone 25 mg.  Started on lisinopril 10 mg.  blood pressures are acceptable.  Labetalol as needed.   Hyperlipidemia: LDL 128.  Lipitor 40."  Feels well today.  Has had some neck soreness at times. Had no exertional chest pain.    Still eating grits, eggs. Lot of chicken, vegetable.    Denies : Dizziness. Leg edema. Nitroglycerin use. Orthopnea. Palpitations. Paroxysmal nocturnal dyspnea. Syncope.        Past Medical History:  Diagnosis Date   Blood clot in vein    left arm vein blood clot, not know the detail, treated with ASA per pt   Cataract    OU   Hypertension    Hypertensive retinopathy    OU    No past surgical history on file.   Current Outpatient Medications  Medication Sig Dispense Refill   amLODipine (NORVASC) 10 MG tablet Take 1 tablet (10 mg total) by mouth daily. 90 tablet 3   atorvastatin (LIPITOR) 40 MG tablet Take 1 tablet (40 mg total) by mouth daily. 90 tablet 3   chlorthalidone (HYGROTON) 25 MG tablet Take 1 tablet (25 mg total) by mouth daily. 90 tablet 3   clopidogrel (PLAVIX) 75 MG tablet  TAKE 1 TABLET(75 MG) BY MOUTH DAILY 90 tablet 3   lisinopril (ZESTRIL) 5 MG tablet TAKE 1 TABLET(5 MG) BY MOUTH DAILY 90 tablet 3   No current facility-administered medications for this visit.    Allergies:   Aspirin    Social History:  The patient  reports that he has never smoked. He has never used smokeless tobacco. He reports that he does not drink alcohol and does not use drugs.   Family History:  The patient's family history includes Colon cancer in his father; Dementia in his mother; Diabetes in his brother; Stroke in his sister.    ROS:  Please see the history of present illness.   Otherwise, review of systems are positive for weakness.   All other systems are reviewed and negative.    PHYSICAL EXAM: VS:  BP 126/78   Pulse (!) 57   Ht 6\' 2"  (1.88 m)   Wt 179 lb (81.2 kg)   SpO2 97%   BMI 22.98 kg/m  , BMI Body mass index is 22.98 kg/m. GEN: Well nourished, well developed, in no acute distress HEENT: normal Neck: no JVD, carotid bruits, or masses Cardiac: RRR; 2/6 holodiastolic  murmur, no rubs, or gallops,no edema  Respiratory:  clear to auscultation bilaterally, normal work of breathing GI: soft, nontender, nondistended, + BS MS: no deformity or atrophy Skin: warm and dry, no rash Neuro:  generalized weakness Psych: euthymic mood, full affect, slow speech   EKG:   The ekg ordered today demonstrates sinus bradycardia RBBB   Recent Labs: 01/14/2021: ALT 13; BUN 31; Creatinine, Ser 1.57; Hemoglobin 11.8; Platelets 238.0; Potassium 4.2; Sodium 137; TSH 0.85   Lipid Panel    Component Value Date/Time   CHOL 138 01/14/2021 0902   TRIG 54.0 01/14/2021 0902   HDL 43.50 01/14/2021 0902   CHOLHDL 3 01/14/2021 0902   VLDL 10.8 01/14/2021 0902   LDLCALC 84 01/14/2021 0902     Other studies Reviewed: Additional studies/ records that were reviewed today with results demonstrating: LDL 84 in Jun 2022.   ASSESSMENT AND PLAN:  CVA: Curently taking Plavix.   Beneficial for heart health.  Encourage activity to build stamina. Would not pursue monitor for now as he has no palpitations.  No AFib on ECG.  CVA was 18 months ago.  HTN: The current medical regimen is effective;  continue present plan and medications. Hyperlipidemia: The current medical regimen is effective;  continue present plan and medications.  Increase fiber intake in diet. AI: No CHF sx.     Current medicines are reviewed at length with the patient today.  The patient concerns regarding his medicines were addressed.  The following changes have been made:  No change  Labs/ tests ordered today include:  No orders of the defined types were placed in this encounter.   Recommend 150 minutes/week of aerobic exercise Low fat, low carb, high fiber diet recommended  Disposition:   FU as needed   Signed, Jul 2022, MD  06/04/2021 10:17 AM    [facility] [address] [place], [place], [place]  [address] Phone: [phone]; Fax: [phone]

## 2021-06-04 NOTE — Patient Instructions (Signed)
Medication Instructions:  Your physician recommends that you continue on your current medications as directed. Please refer to the Current Medication list given to you today.  *If you need a refill on your cardiac medications before your next appointment, please call your pharmacy*   Lab Work: None If you have labs (blood work) drawn today and your tests are completely normal, you will receive your results only by: . MyChart Message (if you have MyChart) OR . A paper copy in the mail If you have any lab test that is abnormal or we need to change your treatment, we will call you to review the results.   Follow-Up: At [facility], you and your health needs are our priority.  As part of our continuing mission to provide you with exceptional heart care, we have created designated Provider Care Teams.  These Care Teams include your primary Cardiologist (physician) and Advanced Practice Providers (APPs -  Physician Assistants and Nurse Practitioners) who all work together to provide you with the care you need, when you need it.  We recommend signing up for the patient portal called "MyChart".  Sign up information is provided on this After Visit Summary.  MyChart is used to connect with patients for Virtual Visits (Telemedicine).  Patients are able to view lab/test results, encounter notes, upcoming appointments, etc.  Non-urgent messages can be sent to your provider as well.   To learn more about what you can do with MyChart, go to [email or web address].    Your next appointment:   Follow up as needed 

## 2021-08-13 DIAGNOSIS — H25812 Combined forms of age-related cataract, left eye: Secondary | ICD-10-CM | POA: Diagnosis not present

## 2021-08-13 DIAGNOSIS — H34231 Retinal artery branch occlusion, right eye: Secondary | ICD-10-CM | POA: Diagnosis not present

## 2021-08-13 DIAGNOSIS — H348312 Tributary (branch) retinal vein occlusion, right eye, stable: Secondary | ICD-10-CM | POA: Diagnosis not present

## 2021-09-10 ENCOUNTER — Other Ambulatory Visit: Payer: Self-pay | Admitting: Family

## 2021-09-11 ENCOUNTER — Other Ambulatory Visit: Payer: Self-pay | Admitting: Family

## 2021-10-03 LAB — HEMOGLOBIN A1C: Hemoglobin A1C: 6.3

## 2021-10-28 ENCOUNTER — Ambulatory Visit (INDEPENDENT_AMBULATORY_CARE_PROVIDER_SITE_OTHER): Payer: Medicare Other | Admitting: Family

## 2021-10-28 ENCOUNTER — Other Ambulatory Visit: Payer: Self-pay | Admitting: Family

## 2021-10-28 ENCOUNTER — Encounter: Payer: Self-pay | Admitting: Family

## 2021-10-28 VITALS — BP 124/80 | HR 57 | Temp 97.6°F | Resp 18 | Ht 74.0 in | Wt 184.4 lb

## 2021-10-28 DIAGNOSIS — R972 Elevated prostate specific antigen [PSA]: Secondary | ICD-10-CM

## 2021-10-28 DIAGNOSIS — Z5941 Food insecurity: Secondary | ICD-10-CM | POA: Diagnosis not present

## 2021-10-28 DIAGNOSIS — E785 Hyperlipidemia, unspecified: Secondary | ICD-10-CM | POA: Diagnosis not present

## 2021-10-28 DIAGNOSIS — M25512 Pain in left shoulder: Secondary | ICD-10-CM

## 2021-10-28 LAB — COMPREHENSIVE METABOLIC PANEL
ALT: 12 U/L (ref 0–53)
AST: 15 U/L (ref 0–37)
Albumin: 4.4 g/dL (ref 3.5–5.2)
Alkaline Phosphatase: 80 U/L (ref 39–117)
BUN: 18 mg/dL (ref 6–23)
CO2: 27 mEq/L (ref 19–32)
Calcium: 9.5 mg/dL (ref 8.4–10.5)
Chloride: 99 mEq/L (ref 96–112)
Creatinine, Ser: 1.23 mg/dL (ref 0.40–1.50)
GFR: 60.08 mL/min (ref >60.00)
Glucose, Bld: 100 mg/dL — ABNORMAL HIGH (ref 70–99)
Potassium: 3.9 mEq/L (ref 3.5–5.1)
Sodium: 136 mEq/L (ref 135–145)
Total Bilirubin: 0.8 mg/dL (ref 0.2–1.2)
Total Protein: 7.7 g/dL (ref 6.0–8.3)

## 2021-10-28 LAB — LIPID PANEL
Cholesterol: 143 mg/dL (ref 0–200)
HDL: 43.5 mg/dL (ref >39.00)
LDL Cholesterol: 87 mg/dL (ref 0–99)
NonHDL: 99.58
Total CHOL/HDL Ratio: 3
Triglycerides: 61 mg/dL (ref 0.0–149.0)
VLDL: 12.2 mg/dL (ref 0.0–40.0)

## 2021-10-28 LAB — CBC WITH DIFFERENTIAL/PLATELET
Basophils Absolute: 0 10*3/uL (ref 0.0–0.1)
Basophils Relative: 0.5 % (ref 0.0–3.0)
Eosinophils Absolute: 0.2 10*3/uL (ref 0.0–0.7)
Eosinophils Relative: 3.2 % (ref 0.0–5.0)
HCT: 38.2 % — ABNORMAL LOW (ref 39.0–52.0)
Hemoglobin: 12.3 g/dL — ABNORMAL LOW (ref 13.0–17.0)
Lymphocytes Relative: 30.8 % (ref 12.0–46.0)
Lymphs Abs: 1.7 10*3/uL (ref 0.7–4.0)
MCHC: 32.1 g/dL (ref 30.0–36.0)
MCV: 82.7 fl (ref 78.0–100.0)
Monocytes Absolute: 0.5 10*3/uL (ref 0.1–1.0)
Monocytes Relative: 9.3 % (ref 3.0–12.0)
Neutro Abs: 3 10*3/uL (ref 1.4–7.7)
Neutrophils Relative %: 56.2 % (ref 43.0–77.0)
Platelets: 260 10*3/uL (ref 150.0–400.0)
RBC: 4.62 Mil/uL (ref 4.22–5.81)
RDW: 15.1 % (ref 11.5–15.5)
WBC: 5.4 10*3/uL (ref 4.0–10.5)

## 2021-10-28 LAB — PSA: PSA: 4.89 ng/mL — ABNORMAL HIGH (ref 0.10–4.00)

## 2021-10-28 NOTE — Progress Notes (Signed)
[name] is a 71 y.o. male with the following history as recorded in EpicCare:  ?Patient Active Problem List  ? Diagnosis Date Noted  ? AKI (acute kidney injury) ([place])   ? Essential hypertension   ? 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 Benign essential HTN   ? Dyslipidemia   ? Dysarthria, post-stroke   ? Cerebral thrombosis with cerebral infarction 12/25/2019  ? Hypertensive emergency 12/24/2019  ? Hypokalemia 11/07/2014  ? Hypertensive urgency 11/07/2014  ? Elevated blood pressure 11/06/2014  ? Syncope, near 11/06/2014  ? GI bleeding 11/06/2014  ? Hand laceration 11/06/2014  ?  ?Current Outpatient Medications  ?Medication Sig Dispense Refill  ? amLODipine (NORVASC) 10 MG tablet TAKE 1 TABLET BY MOUTH  DAILY 90 tablet 3  ? atorvastatin (LIPITOR) 40 MG tablet TAKE 1 TABLET BY MOUTH  DAILY 90 tablet 3  ? chlorthalidone (HYGROTON) 25 MG tablet TAKE 1 TABLET BY MOUTH  DAILY 90 tablet 3  ? clopidogrel (PLAVIX) 75 MG tablet TAKE 1 TABLET BY MOUTH  DAILY 90 tablet 3  ? lisinopril (ZESTRIL) 5 MG tablet TAKE 1 TABLET BY MOUTH  DAILY 90 tablet 3  ? ?No current facility-administered medications for this visit.  ?  ?Allergies: Aspirin  ?Past Medical History:  ?Diagnosis Date  ? Blood clot in vein   ? left arm vein blood clot, not know the detail, treated with ASA per pt  ? Cataract   ? OU  ? Hypertension   ? Hypertensive retinopathy   ? OU  ?  ?No past surgical history on file.  ?Family History  ?Problem Relation Age of Onset  ? Dementia Mother   ? Colon cancer Father   ? Stroke Sister   ?     Sister had brain aneurysm  ? Diabetes Brother   ?  ?Social History  ? ?Tobacco Use  ? Smoking status: Never  ? Smokeless tobacco: Never  ?Substance Use Topics  ? Alcohol use: No  ?  ?Subjective:  ? ?Follow up on chronic care needs;  ?Had home visit with [facility] for [place] was 6.3; mild/ moderate circulation issues noted- patient is under care of cardiology and had check up in November 2022;   ?Does not drive; not getting Meals on Wheels as ordered;  ? ? ? ?Objective:  ?Vitals:  ? 10/28/21 0918  ?BP: 124/80  ?Pulse: (!) 57  ?Resp: 18  ?Temp: 97.6 ?F (36.4 ?C)  ?TempSrc: Oral  ?SpO2: 97%  ?Weight: 184 lb 6.4 oz (83.6 kg)  ?Height: _0  (1.88 m)  ?  ?General: Well developed, well nourished, in no acute distress  ?Skin : Warm and dry.  ?Head: Normocephalic and atraumatic  ?Eyes: Sclera and conjunctiva clear; pupils round and reactive to light; extraocular movements intact  ?Ears: External normal; canals clear; tympanic membranes normal  ?Oropharynx: Pink, supple. No suspicious lesions  ?Neck: Supple without thyromegaly, adenopathy  ?Lungs: Respirations unlabored; clear to auscultation bilaterally without wheeze, rales, rhonchi  ?CVS exam: normal rate and regular rhythm.  ?Musculoskeletal: No deformities; no active joint inflammation; LROM left shoulder ?Extremities: No edema, cyanosis, clubbing  ?Vessels: Symmetric bilaterally  ?Neurologic: Alert and oriented; speech intact; face symmetrical; uses cane for stability; ? ?Assessment:  ?1. Dyslipidemia   ?2. Elevated PSA   ?3. Food insecurity   ?  ?Plan:  ?Update labs today; ?Re-check PSA; did not see urology as requested last year- referral updated;  ?Social work referral put in place;  ? ?  Time spent with patient today- 30 minutes reviewing notes/ records/ discussing needed care ? ?This visit occurred during the SARS-CoV-2 public health emergency.  Safety protocols were in place, including screening questions prior to the visit, additional usage of staff PPE, and extensive cleaning of exam room while observing appropriate contact time as indicated for disinfecting solutions.  ? ? ?No follow-ups on file.  ?Orders Placed This Encounter  ?Procedures  ? PSA  ? CBC with Differential/Platelet  ? Comp Met (CMET)  ? Lipid panel  ? Hemoglobin A1c  ?  This external order was created through the Results Console.  ? AMB Referral to [facility]  ?   Referral Priority:   Routine  ?  Referral Type:   Consultation  ?  Referral Reason:   Care Coordination  ?  Number of Visits Requested:   1  ?  ?Requested Prescriptions  ? ? No prescriptions requested or ordered in this encounter  ?  ? ?

## 2021-10-29 ENCOUNTER — Telehealth: Payer: Self-pay | Admitting: *Deleted

## 2021-10-29 ENCOUNTER — Telehealth: Payer: Self-pay

## 2021-10-29 NOTE — Telephone Encounter (Signed)
See labs.  Had to leave message for call back. ?

## 2021-10-29 NOTE — Telephone Encounter (Signed)
Caller Name Bingham Millette ?Caller Phone Number [phone] ?Call Type Message Only Information Provided ?Reason for Call Returning a Call from the Office ?Initial Comment Caller states missed call ?Disp. Time Disposition Final User ?10/28/2021 7:02:39 PM General Information Provided Yes Angelyn Punt ?Call Closed By: Angelyn Punt ?Transaction Date/Time: 10/28/2021 6:59:53 PM (ET ?

## 2021-10-29 NOTE — Progress Notes (Signed)
Left message on machine to call back  

## 2021-10-29 NOTE — Telephone Encounter (Signed)
? ?  Telephone encounter was:  Successful.  ?10/29/2021 ?Name: Brandon Perkins MRN: [id number] DOB: 1950-09-29 ? ?Brandon Perkins is a 71 y.o. year old male who is a primary care patient of Olive Bass, FNP . The community resource team was consulted for assistance with Transportation Needs  and Food Insecurity ? ?Care guide performed the following interventions: Patient provided with information about care guide support team and interviewed to confirm resource needs.Patient has had a stroke and needs help with food and transportation. I will call to follow up with meals on wheels and we will call about life alert. He has no need for transportation at this time but has my contact information for the future needs ? ?Follow Up Plan:  Care guide will follow up with patient by phone over the next day ? ? ? ?Lenard Forth ?Care Guide, Embedded Care Coordination ?[facility] [place], Care Management  ?[phone] ?[address] [place], [place], [place] [address] ?Phone: [phone] ?Email: [email or web address] ? ?  ?

## 2021-10-30 ENCOUNTER — Ambulatory Visit: Payer: Medicare Other | Admitting: Family

## 2021-10-31 ENCOUNTER — Telehealth: Payer: Self-pay

## 2021-10-31 NOTE — Telephone Encounter (Signed)
Please disregard

## 2021-10-31 NOTE — Telephone Encounter (Signed)
? ?  Telephone encounter was:  Successful.  ?10/31/2021 ?Name: Brandon Perkins MRN: [id number] DOB: May 03, 1951 ? ?[name] is a 71 y.o. year old male who is a primary care patient of Olive Bass, FNP . The community resource team was consulted for assistance with Transportation Needs  ? ?Care guide performed the following interventions: Patient provided with information about care guide support team and interviewed to confirm resource needs. Following up call and setting up transportation for appointment on 11/05/2021 at [facility] 10;00through insurance for 2 one way rides. I will follow up next week ? ?Follow Up Plan:  Care guide will follow up with patient by phone over the next monday ? ? ? ?Lenard Forth ?Care Guide, Embedded Care Coordination ?[place], Care Management  ?[phone] ?[address] [place], [place], [place] [address] ?Phone: [phone] ?Email: [email or web address] ? ?  ?

## 2021-11-04 ENCOUNTER — Telehealth: Payer: Self-pay

## 2021-11-04 NOTE — Telephone Encounter (Signed)
? ?  Telephone encounter was:  Unsuccessful.  11/04/2021 ?Name: Brandon Perkins MRN: [id number] DOB: [birth year] ? ?Unsuccessful outbound call made today to assist with:  Transportation Needs  ? ?Outreach Attempt:  1st Attempt ? ?A HIPAA compliant voice message was left requesting a return call.  Instructed patient to call back at earliest convenience. ? ?. ? ? ?Lenard Forth ?Care Guide, Embedded Care Coordination ?[facility], Care Management  ?[phone] ?[address] [place], [place], [place] [address] ?Phone: [phone] ?Email: [email or web address] ? ?  ?

## 2021-11-04 NOTE — Progress Notes (Signed)
Dg              ? ? Brandon Perkins ?[facility] ?[place] ?Phone: [phone] ?  ?Assessment and Plan:   ?  ?1. Left shoulder pain, unspecified chronicity ?2. Dislocation of left acromioclavicular joint, initial encounter ?3. Rotator cuff tendonitis, left ?- Chronic with exacerbation, initial sports medicine visit ?- Suspect the patient has had multiple chronic injuries that may have included an [facility] joint dislocation and shoulder dislocation based on imaging, physical exam, HPI.  Patient has had no recent significant MOI, so suspect that these chronic injuries have acutely flared ?- Patient elected for CSI subacromial.  Tolerated well per note below ?- Start HEP to improve ROM ?- Start Tylenol 500 to 1000 mg tablets 2-3 times a day for day-to-day pain relief  ? ?Procedure: Subacromial Injection ?Side: Left ? ?Risks explained and consent was given verbally. The site was cleaned with alcohol prep. A steroid injection was performed from posterior approach using 60mL of 1% lidocaine without epinephrine and 51mL of kenalog 40mg /ml. This was well tolerated and resulted in symptomatic relief.  Needle was removed, hemostasis achieved, and post injection instructions were explained.   Pt was advised to call or return to clinic if these symptoms worsen or fail to improve as anticipated.  ?  ?Pertinent previous records reviewed include none ?  ?Follow Up: 4 weeks for reevaluation.  Could consider ultrasound versus advanced imaging versus NSAID course versus physical therapy ?  ?Subjective:   ?I, Pincus Badder, am serving as a Education administrator for [employer] ? ?Chief Complaint: left shoulder pain  ? ?HPI:  ? ?11/05/2021 ?Patient is a 71 year old male complaining of left shoulder pain. Patient states he was taking a shower and he was scrubbing his back and had pain, it feels like he has some fluid in there, no numbness or tingling just tight dull pain , no radiating pain, just  stays in the shoulder,  anterior shoulder pain , decreased ROM, hasn't been taking meds other than what dr rx ? ? ?Relevant Historical Information: Hypertension, GI bleed, history of AKI, CVA ? ?Additional pertinent review of systems negative. ? ? ?Current Outpatient Medications:  ?  amLODipine (NORVASC) 10 MG tablet, TAKE 1 TABLET BY MOUTH  DAILY, Disp: 90 tablet, Rfl: 3 ?  atorvastatin (LIPITOR) 40 MG tablet, TAKE 1 TABLET BY MOUTH  DAILY, Disp: 90 tablet, Rfl: 3 ?  chlorthalidone (HYGROTON) 25 MG tablet, TAKE 1 TABLET BY MOUTH  DAILY, Disp: 90 tablet, Rfl: 3 ?  clopidogrel (PLAVIX) 75 MG tablet, TAKE 1 TABLET BY MOUTH  DAILY, Disp: 90 tablet, Rfl: 3 ?  lisinopril (ZESTRIL) 5 MG tablet, TAKE 1 TABLET BY MOUTH  DAILY, Disp: 90 tablet, Rfl: 3  ? ?Objective:   ?  ?Vitals:  ? 11/05/21 1004  ?BP: 124/80  ?Pulse: 68  ?SpO2: 99%  ?Weight: 180 lb (81.6 kg)  ?Height: 6\' 2"  (1.88 m)  ?  ?  ?Body mass index is 23.11 kg/m?.  ?  ?Physical Exam:   ? ?Gen: Appears well, nad, nontoxic and pleasant ?Neuro:sensation intact, strength is 5/5 with df/pf/inv/ev, muscle tone wnl ?Skin: no suspicious lesion or defmority ?Psych: A&O, appropriate mood and affect ? ?Left shoulder:  swelling or muscle wasting ?Left distal clavicle  posterior to the acromion with increased movement of distal clavicle with palpation ?No scapular winging ?FF 100, abd 90, int 20, ext 70 ?NTTP over the [place], clavicle,  ac, coracoid, biceps groove, humerus, deltoid, trapezius, cervical spine ?Positive neer, hawkings, empty can, subscap liftoff, speeds, obriens, crossarm ?Neg ant drawer, sulcus sign, apprehension ?Negative Spurling's test bilat ?FROM of neck  ? ? ?Electronically signed by:  ?Brandon Perkins ?[facility] ?10:42 AM 11/05/21 ?

## 2021-11-04 NOTE — Telephone Encounter (Signed)
? ?  Telephone encounter was:  Successful.  ?11/04/2021 ?Name: Brandon Perkins MRN: [id number] DOB: 1950-09-14 ? ?Brandon Perkins is a 71 y.o. year old male who is a primary care patient of Marrian Salvage, [place] . The community resource team was consulted for assistance with  life alert ? ?Care guide performed the following interventions: Patient provided with information about care guide support team and interviewed to confirm resource needs. Follow up call about transportation and call insurance for life alert, Inurance will not cover life alert  ? ?Follow Up Plan:  Care guide will follow up with patient by phone over the next day ? ? ? ?Larena Sox ?Care Guide, Embedded Care Coordination ?[facility], Care Management  ?[phone] ?[address], [place], [place] [address] ?Phone: [phone] ?Email: [email or web address] ? ?  ?

## 2021-11-05 ENCOUNTER — Ambulatory Visit: Payer: Medicare Other | Admitting: Sports Medicine

## 2021-11-05 ENCOUNTER — Ambulatory Visit (INDEPENDENT_AMBULATORY_CARE_PROVIDER_SITE_OTHER): Payer: Medicare Other

## 2021-11-05 VITALS — BP 124/80 | HR 68 | Ht 74.0 in | Wt 180.0 lb

## 2021-11-05 DIAGNOSIS — M25512 Pain in left shoulder: Secondary | ICD-10-CM

## 2021-11-05 DIAGNOSIS — S43102A Unspecified dislocation of left acromioclavicular joint, initial encounter: Secondary | ICD-10-CM | POA: Diagnosis not present

## 2021-11-05 DIAGNOSIS — M7582 Other shoulder lesions, left shoulder: Secondary | ICD-10-CM | POA: Diagnosis not present

## 2021-11-05 DIAGNOSIS — M19012 Primary osteoarthritis, left shoulder: Secondary | ICD-10-CM | POA: Diagnosis not present

## 2021-11-05 NOTE — Patient Instructions (Addendum)
Good to see you  ?Shoulder HEP  ?Tylenol [phone] mg 2-3 times a day for pain relief  ?4 week follow up  ? ?

## 2021-11-21 ENCOUNTER — Ambulatory Visit: Payer: Medicare Other | Admitting: Family

## 2021-12-09 NOTE — Progress Notes (Signed)
? ?   Brandon Perkins D.Judd Gaudier ?[facility] ?[address] [place] [address] ?Phone: [phone] ?  ?Assessment and Plan:   ?  ?.1. Left shoulder pain, unspecified chronicity ?2. Rotator cuff tendonitis, left ?-Chronic with exacerbation, subsequent visit ?- Overall moderate improvement after subacromial CSI at office visit on 11/05/2021 ?- Continue HEP ?- Continue Tylenol as needed for day-to-day pain relief ?-Start Voltaren gel topically as needed for pain relief ? ?Pertinent previous records reviewed include none ?  ?Follow Up: As needed if no improvement or worsening of symptoms.  Hope to have at least 3 months in between injections, but could repeat injection of 02/04/2022 ?  ?Subjective:   ?I, Brandon Perkins, am serving as a Neurosurgeon for Doctor [employer] ?  ?Chief Complaint: left shoulder pain  ?  ?HPI:  ?  ?11/05/2021 ?Patient is a 71 year old male complaining of left shoulder pain. Patient states he was taking a shower and he was scrubbing his back and had pain, it feels like he has some fluid in there, no numbness or tingling just tight dull pain , no radiating pain, just stays in the shoulder,  anterior shoulder pain , decreased ROM, hasn't been taking meds other than what dr rx ? ?12/10/2021 ?Patient states shoulder is good still hurts a little bit but much better  ?  ?  ?Relevant Historical Information: Hypertension, GI bleed, history of AKI, CVA ? ?Additional pertinent review of systems negative. ? ? ?Current Outpatient Medications:  ?  amLODipine (NORVASC) 10 MG tablet, TAKE 1 TABLET BY MOUTH  DAILY, Disp: 90 tablet, Rfl: 3 ?  atorvastatin (LIPITOR) 40 MG tablet, TAKE 1 TABLET BY MOUTH  DAILY, Disp: 90 tablet, Rfl: 3 ?  chlorthalidone (HYGROTON) 25 MG tablet, TAKE 1 TABLET BY MOUTH  DAILY, Disp: 90 tablet, Rfl: 3 ?  clopidogrel (PLAVIX) 75 MG tablet, TAKE 1 TABLET BY MOUTH  DAILY, Disp: 90 tablet, Rfl: 3 ?  lisinopril (ZESTRIL) 5 MG tablet, TAKE 1 TABLET BY MOUTH  DAILY,  Disp: 90 tablet, Rfl: 3  ? ?Objective:   ?  ?Vitals:  ? 12/10/21 1016  ?BP: 110/80  ?Pulse: (!) 59  ?SpO2: 97%  ?Weight: 180 lb (81.6 kg)  ?Height: 6\' 2"  (1.88 m)  ?  ?  ?Body mass index is 23.11 kg/m?.  ?  ?Physical Exam:   ? ?Gen: Appears well, nad, nontoxic and pleasant ?Neuro:sensation intact, strength is 5/5 with df/pf/inv/ev, muscle tone wnl ?Skin: no suspicious lesion or defmority ?Psych: A&O, appropriate mood and affect ?  ?Left shoulder:  no swelling or muscle wasting ?Left distal clavicle  posterior to the acromion with increased movement of distal clavicle with palpation ?No scapular winging ?FF 170, abd 600, int 10, ext 80 ?NTTP over the [place], clavicle, ac, coracoid, biceps groove, humerus, deltoid, trapezius, cervical spine ?Negative  neer, hawkings, empty can, subscap liftoff, speeds, obriens, crossarm ?Neg ant drawer, sulcus sign, apprehension ?Negative Spurling's test bilat ?FROM of neck  ? ? ?Electronically signed by:  ? D.Brandon Perkins ?[facility] ?10:49 AM 12/10/21 ?

## 2021-12-10 ENCOUNTER — Ambulatory Visit (INDEPENDENT_AMBULATORY_CARE_PROVIDER_SITE_OTHER): Payer: Medicare Other | Admitting: Sports Medicine

## 2021-12-10 VITALS — BP 110/80 | HR 59 | Ht 74.0 in | Wt 180.0 lb

## 2021-12-10 DIAGNOSIS — M25512 Pain in left shoulder: Secondary | ICD-10-CM | POA: Diagnosis not present

## 2021-12-10 DIAGNOSIS — M7582 Other shoulder lesions, left shoulder: Secondary | ICD-10-CM

## 2021-12-10 NOTE — Patient Instructions (Addendum)
Good to see you  ?Continue HEP ?Continue tylenol as needed  ?Can buy Voltaren gel over the counter  ?As needed follow up  ?

## 2021-12-17 ENCOUNTER — Telehealth: Payer: Self-pay | Admitting: Family

## 2021-12-17 NOTE — Telephone Encounter (Signed)
Pt would like to know if he is due for a colonoscopy.  ?

## 2021-12-18 ENCOUNTER — Other Ambulatory Visit: Payer: Self-pay | Admitting: Family

## 2021-12-18 DIAGNOSIS — Z1211 Encounter for screening for malignant neoplasm of colon: Secondary | ICD-10-CM

## 2021-12-18 NOTE — Telephone Encounter (Signed)
Patient stated he got a call about colonoscopy.  He stated that it has been years since he got one and he does not remember where.  He is willing to do one.  He was worried about transportation but I advised him that when they call to schedule to ask a about transportation services.  Are you ok with putting in referral for colonoscopy?

## 2021-12-19 ENCOUNTER — Encounter: Payer: Self-pay | Admitting: Family

## 2021-12-26 ENCOUNTER — Encounter: Payer: Self-pay | Admitting: Family

## 2022-01-19 ENCOUNTER — Telehealth: Payer: Self-pay | Admitting: Family

## 2022-01-19 NOTE — Telephone Encounter (Signed)
Looks like he did not complete cologuard that was ordered last year by Vernona Rieger.  If he is willing to complete, please reorder.

## 2022-01-20 NOTE — Telephone Encounter (Signed)
GI referral for colonoscopy entered on 12/18/21.

## 2022-01-21 ENCOUNTER — Telehealth: Payer: Self-pay

## 2022-01-21 NOTE — Telephone Encounter (Signed)
error 

## 2022-01-22 ENCOUNTER — Ambulatory Visit: Payer: Medicare Other

## 2022-01-22 NOTE — Progress Notes (Deleted)
Subjective:   Brandon Perkins is a 71 y.o. male who presents for an Initial Medicare Annual Wellness Visit.  Review of Systems           Objective:    There were no vitals filed for this visit. There is no height or weight on file to calculate BMI.     12/29/2019    4:23 PM 12/24/2019   12:45 PM 11/06/2018    8:38 AM 11/07/2014    2:42 AM 11/06/2014    9:48 AM  Advanced Directives  Does Patient Have a Medical Advance Directive? No No No No No  Would patient like information on creating a medical advance directive? No - Patient declined No - Patient declined  No - patient declined information No - patient declined information    Current Medications (verified) Outpatient Encounter Medications as of 01/22/2022  Medication Sig   amLODipine (NORVASC) 10 MG tablet TAKE 1 TABLET BY MOUTH  DAILY   atorvastatin (LIPITOR) 40 MG tablet TAKE 1 TABLET BY MOUTH  DAILY   chlorthalidone (HYGROTON) 25 MG tablet TAKE 1 TABLET BY MOUTH  DAILY   clopidogrel (PLAVIX) 75 MG tablet TAKE 1 TABLET BY MOUTH  DAILY   lisinopril (ZESTRIL) 5 MG tablet TAKE 1 TABLET BY MOUTH  DAILY   No facility-administered encounter medications on file as of 01/22/2022.    Allergies (verified) Aspirin   History: Past Medical History:  Diagnosis Date   Blood clot in vein    left arm vein blood clot, not know the detail, treated with ASA per pt   Cataract    OU   Hypertension    Hypertensive retinopathy    OU   No past surgical history on file. Family History  Problem Relation Age of Onset   Dementia Mother    Colon cancer Father    Stroke Sister        Sister had brain aneurysm   Diabetes Brother    Social History   Socioeconomic History   Marital status: Married    Spouse name: Not on file   Number of children: Not on file   Years of education: Not on file   Highest education level: Not on file  Occupational History   Not on file  Tobacco Use   Smoking status: Never   Smokeless tobacco: Never   Vaping Use   Vaping Use: Never used  Substance and Sexual Activity   Alcohol use: No   Drug use: No   Sexual activity: Not on file  Other Topics Concern   Not on file  Social History Narrative   Not on file   Social Determinants of Health   Financial Resource Strain: Not on file  Food Insecurity: Food Insecurity Present (10/29/2021)   Hunger Vital Sign    Worried About Running Out of Food in the Last Year: Sometimes true    Ran Out of Food in the Last Year: Sometimes true  Transportation Needs: Unmet Transportation Needs (10/29/2021)   PRAPARE - Administrator, Civil Service (Medical): Yes    Lack of Transportation (Non-Medical): Yes  Physical Activity: Not on file  Stress: Not on file  Social Connections: Not on file    Tobacco Counseling Counseling given: Not Answered   Clinical Intake:                 Diabetic?no         Activities of Daily Living     No data  to display          Patient Care Team: Olive Bass, FNP as PCP - General (Internal Medicine)  Indicate any recent Medical Services you may have received from other than [facility] providers in the past year (date may be approximate).     Assessment:   This is a routine wellness examination for [name].  Hearing/Vision screen No results found.  Dietary issues and exercise activities discussed:     Goals Addressed   None    Depression Screen    01/14/2021    8:23 AM 01/14/2021    8:18 AM  PHQ 2/9 Scores  PHQ - 2 Score 5 0    Fall Risk    01/14/2021    8:17 AM  Fall Risk   Falls in the past year? 1  Number falls in past yr: 0  Injury with Fall? 0  Risk for fall due to : History of fall(s);Impaired balance/gait;Impaired mobility;Impaired vision  Follow up Falls evaluation completed    FALL RISK PREVENTION PERTAINING TO THE HOME:  Any stairs in or around the home? {YES/NO:21197} If so, are there any without handrails? {YES/NO:21197} Home free of loose  throw rugs in walkways, pet beds, electrical cords, etc? {YES/NO:21197} Adequate lighting in your home to reduce risk of falls? {YES/NO:21197}  ASSISTIVE DEVICES UTILIZED TO PREVENT FALLS:  Life alert? {YES/NO:21197} Use of a cane, walker or w/c? {YES/NO:21197} Grab bars in the bathroom? {YES/NO:21197} Shower chair or bench in shower? {YES/NO:21197} Elevated toilet seat or a handicapped toilet? {YES/NO:21197}  TIMED UP AND GO:  Was the test performed? {YES/NO:21197}.  Length of time to ambulate 10 feet: *** sec.   {Appearance of XVQM:0867619}  Cognitive Function:        Immunizations Immunization History  Administered Date(s) Administered   Moderna SARS-COV2 Booster Vaccination 10/01/2020, 10/01/2020   Moderna Sars-Covid-2 Vaccination 05/07/2020, 06/04/2020    {TDAP status:2101805}  {Flu Vaccine status:2101806}  {Pneumococcal vaccine status:2101807}  {Covid-19 vaccine status:2101808}  Qualifies for Shingles Vaccine? {YES/NO:21197}  Zostavax completed {YES/NO:21197}  {Shingrix Completed?:2101804}  Screening Tests Health Maintenance  Topic Date Due   Hepatitis C Screening  Never done   TETANUS/TDAP  Never done   COLONOSCOPY (Pts 45-25yrs Insurance coverage will need to be confirmed)  Never done   Zoster Vaccines- Shingrix (1 of 2) Never done   Pneumonia Vaccine 40+ Years old (1 - PCV) Never done   COVID-19 Vaccine (3 - Moderna series) 11/26/2020   INFLUENZA VACCINE  03/03/2022   HPV VACCINES  Aged Out    Health Maintenance  Health Maintenance Due  Topic Date Due   Hepatitis C Screening  Never done   TETANUS/TDAP  Never done   COLONOSCOPY (Pts 45-43yrs Insurance coverage will need to be confirmed)  Never done   Zoster Vaccines- Shingrix (1 of 2) Never done   Pneumonia Vaccine 51+ Years old (1 - PCV) Never done   COVID-19 Vaccine (3 - Moderna series) 11/26/2020    {Colorectal cancer screening:2101809}  Lung Cancer Screening: (Low Dose CT Chest  recommended if Age 69-80 years, 30 pack-year currently smoking OR have quit w/in 15years.) does not qualify.   Lung Cancer Screening Referral: N/A  Additional Screening:  Hepatitis C Screening: {DOES NOT does:27190::"does not"} qualify; Completed ***  Vision Screening: Recommended annual ophthalmology exams for early detection of glaucoma and other disorders of the eye. Is the patient up to date with their annual eye exam?  {YES/NO:21197} Who is the provider or what is the name  of the office in which the patient attends annual eye exams? *** If pt is not established with a provider, would they like to be referred to a provider to establish care? {YES/NO:21197}.   Dental Screening: Recommended annual dental exams for proper oral hygiene  Community Resource Referral / Chronic Care Management: CRR required this visit?  {YES/NO:21197}  CCM required this visit?  {YES/NO:21197}     Plan:     I have personally reviewed and noted the following in the patient's chart:   Medical and social history Use of alcohol, tobacco or illicit drugs  Current medications and supplements including opioid prescriptions. {Opioid Prescriptions:[phone]} Functional ability and status Nutritional status Physical activity Advanced directives List of other physicians Hospitalizations, surgeries, and ER visits in previous 12 months Vitals Screenings to include cognitive, depression, and falls Referrals and appointments  In addition, I have reviewed and discussed with patient certain preventive protocols, quality metrics, and best practice recommendations. A written personalized care plan for preventive services as well as general preventive health recommendations were provided to patient.     Duard Brady Clydean Posas, [place]   01/22/2022   Nurse Notes: ***

## 2022-02-18 DIAGNOSIS — H348312 Tributary (branch) retinal vein occlusion, right eye, stable: Secondary | ICD-10-CM | POA: Diagnosis not present

## 2022-02-18 DIAGNOSIS — H34231 Retinal artery branch occlusion, right eye: Secondary | ICD-10-CM | POA: Diagnosis not present

## 2022-02-18 DIAGNOSIS — H25812 Combined forms of age-related cataract, left eye: Secondary | ICD-10-CM | POA: Diagnosis not present

## 2022-03-09 ENCOUNTER — Telehealth: Payer: Self-pay | Admitting: Family

## 2022-03-09 NOTE — Telephone Encounter (Signed)
I have called the pt back and informed him that the resource that he is needing to contact is Brandon Perkins and gave him the phone number. He stated understanding and he will call.   He stated that his son and his neighbor does helps him a bit.    Lenard Forth Care Guide, Embedded Care Coordination [facility], Care Management  [phone]

## 2022-03-09 NOTE — Telephone Encounter (Signed)
Pt states he got a call from someone and was not sure who he spoke to. He stated they asked him about needing help with food and he said yes but has not heard anything since. Looks like pt no showed a awv, please advise if they could help or if a referral to ccm is necessary.

## 2022-06-08 IMAGING — DX DG SHOULDER 2+V*L*
3 series · 3 of 3 positions shown · non-contrast
Comparison: None.

CLINICAL DATA: Left shoulder pain x 2 weeks.

EXAM:
LEFT SHOULDER - 3 VIEW

[shoulder ap (1 of 2)]
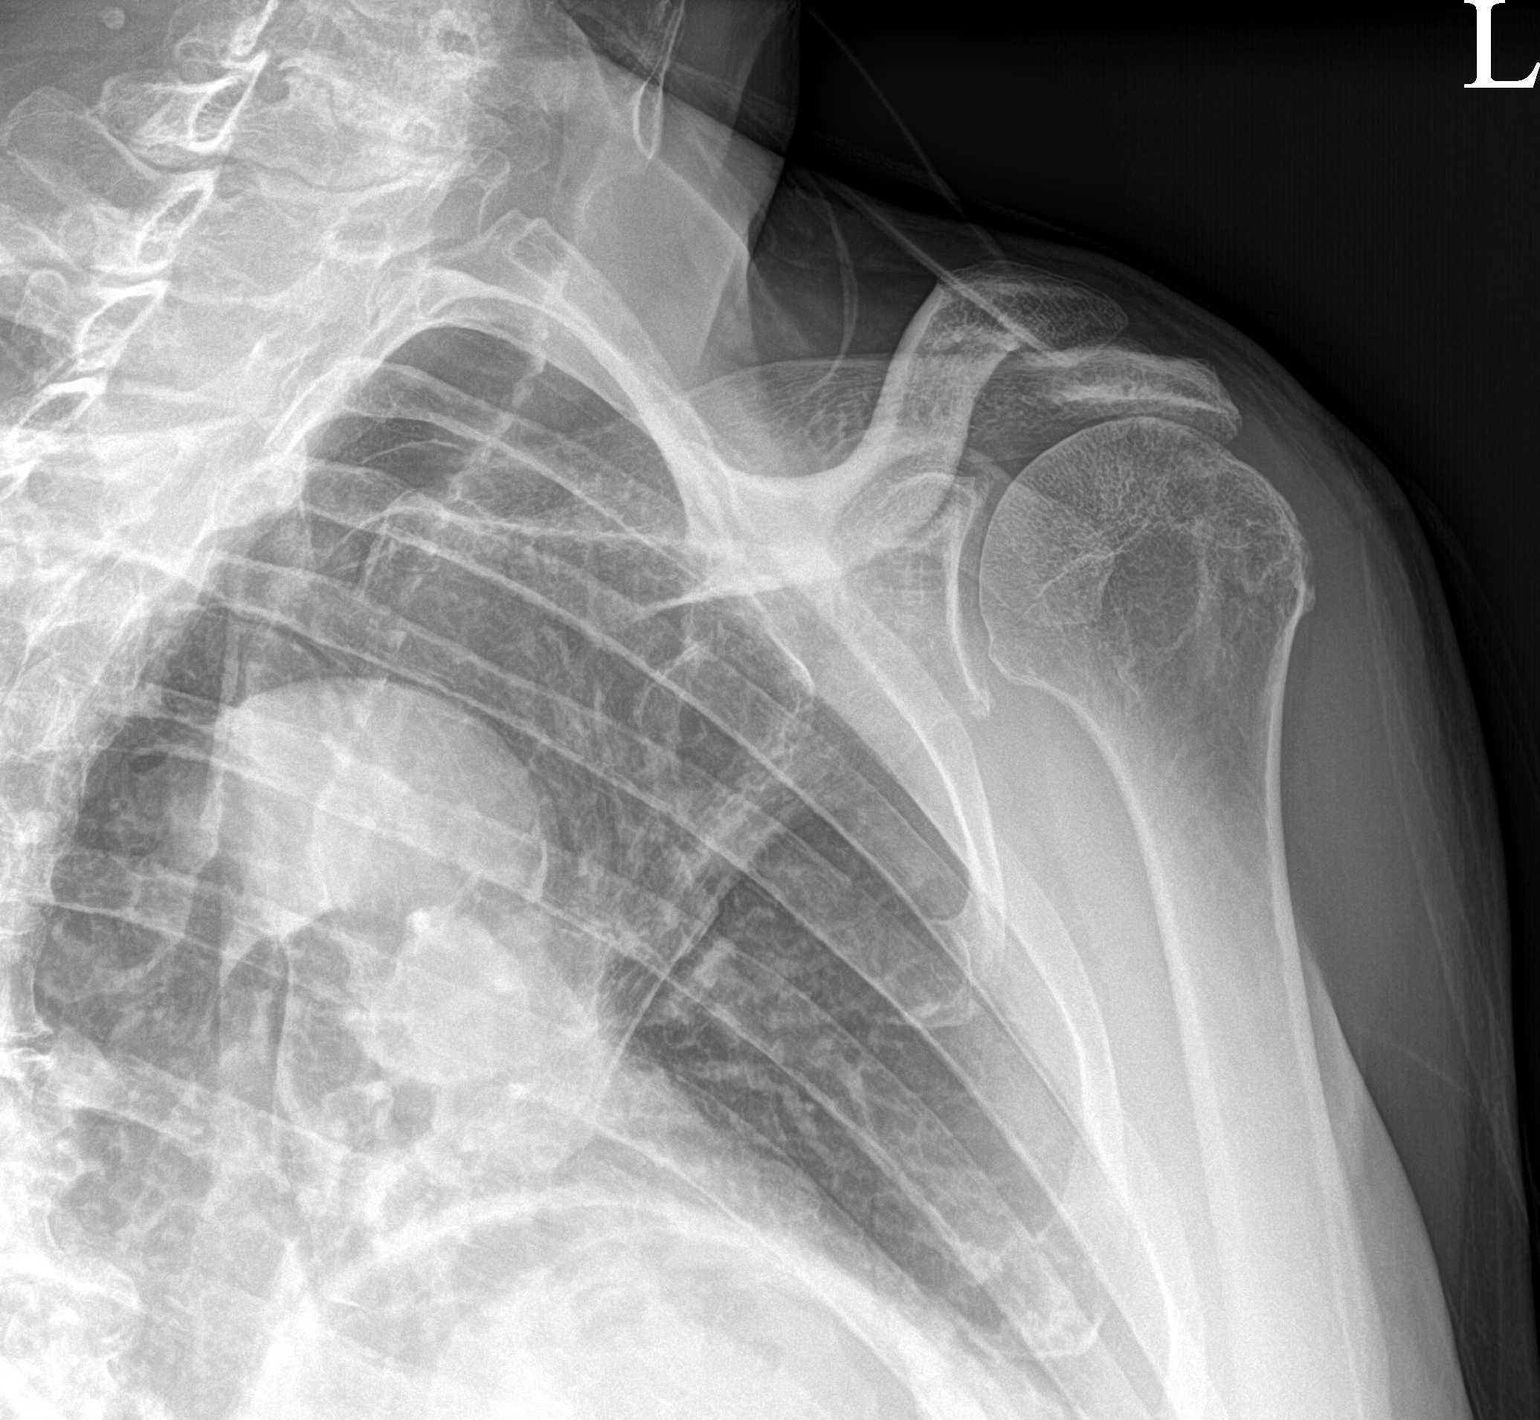

[shoulder ap (2 of 2)]
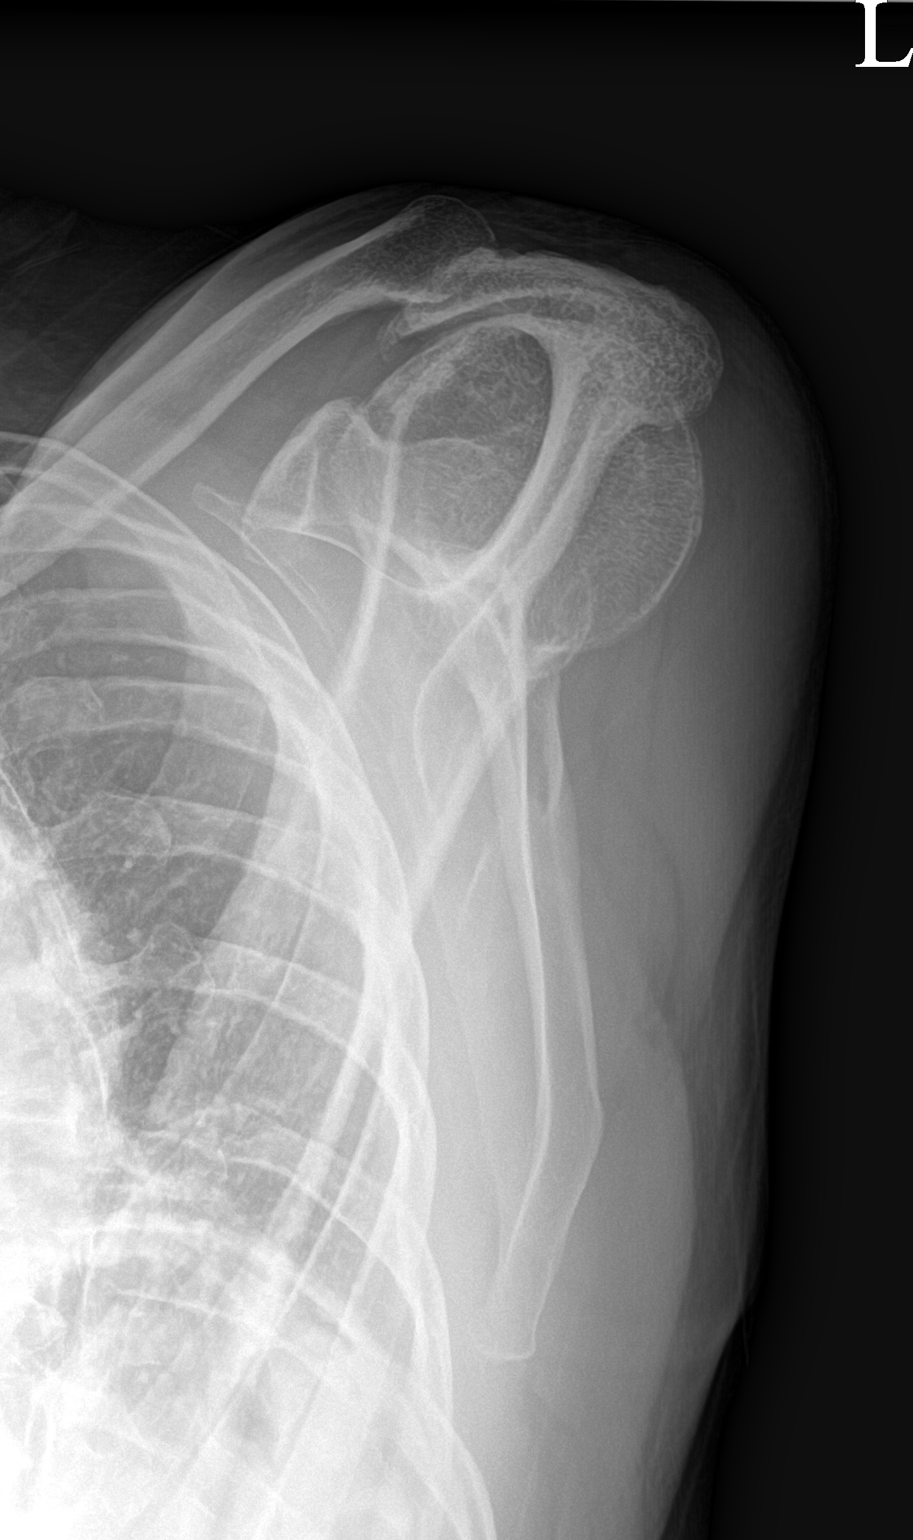

[shoulder axial]
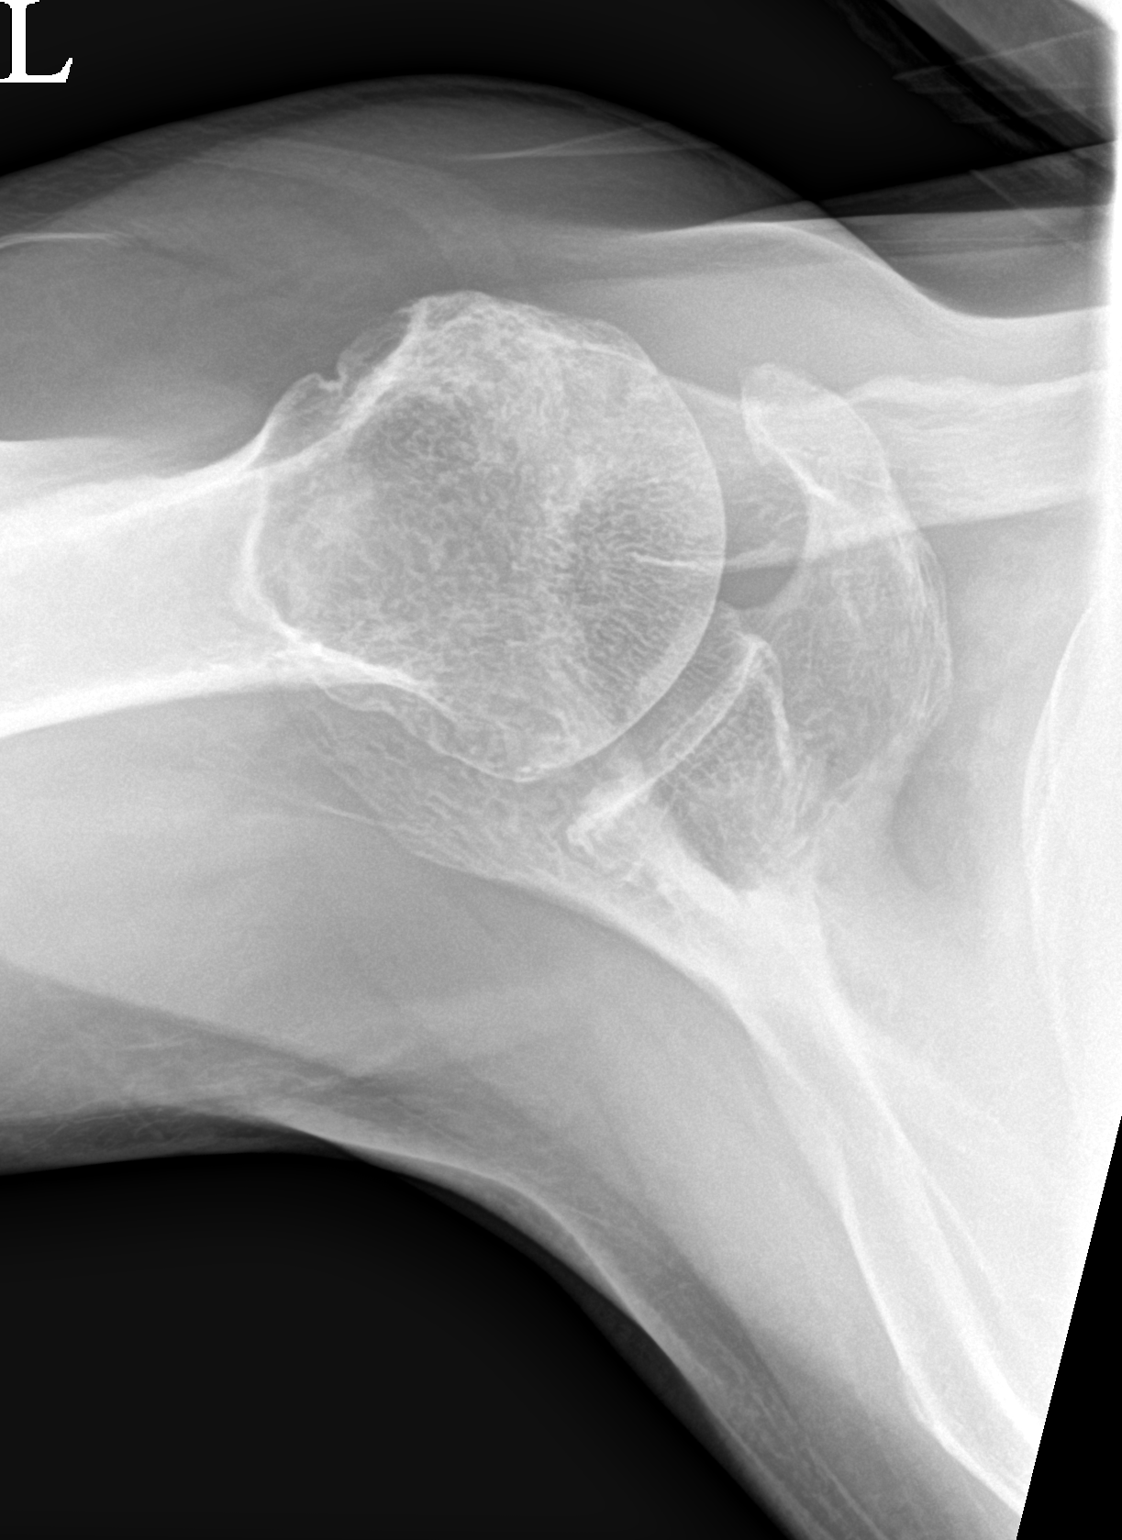

[3 of 3 positions shown; findings below may reference images not displayed]

FINDINGS: No fracture or dislocation is seen. There are no abnormal soft
tissue calcifications. Small bony spurs seen in the shoulder joint.
There is decreased distance between humeral head and acromion.
IMPRESSION: No fracture or dislocation is seen. Decreased distance between
humeral head and acromion suggests possible chronic tear of rotator
cuff. Degenerative changes are noted with small bony spurs in the
left shoulder joint.

## 2022-06-14 ENCOUNTER — Other Ambulatory Visit: Payer: Self-pay | Admitting: Family

## 2022-06-19 ENCOUNTER — Ambulatory Visit (INDEPENDENT_AMBULATORY_CARE_PROVIDER_SITE_OTHER): Payer: Medicare Other | Admitting: Family

## 2022-06-19 VITALS — BP 120/72 | HR 67 | Temp 98.3°F | Resp 18 | Ht 74.0 in | Wt 176.4 lb

## 2022-06-19 DIAGNOSIS — L819 Disorder of pigmentation, unspecified: Secondary | ICD-10-CM | POA: Diagnosis not present

## 2022-06-19 NOTE — Progress Notes (Signed)
Brandon Perkins is a 71 y.o. 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 90 tablet 0   atorvastatin (LIPITOR) 40 MG tablet Take 1 tablet (40 mg total) by mouth daily. 90 tablet 0   chlorthalidone (HYGROTON) 25 MG tablet Take 1 tablet (25 mg total) by mouth daily. 90 tablet 0   clopidogrel (PLAVIX) 75 MG tablet Take 1 tablet (75 mg total) by mouth daily. 90 tablet 0   lisinopril (ZESTRIL) 5 MG tablet Take 1 tablet (5 mg total) by mouth daily. 90 tablet 0   No current facility-administered medications for this visit.    Allergies: Aspirin  Past Medical History:  Diagnosis Date   Blood clot in vein    left arm vein blood clot, not know the detail, treated with ASA per pt   Cataract    OU   Hypertension    Hypertensive retinopathy    OU    No past surgical history on file.  Family History  Problem Relation Age of Onset   Dementia Mother    Colon cancer Father    Stroke Sister        Sister had brain aneurysm   Diabetes Brother     Social History   Tobacco Use   Smoking status: Never   Smokeless tobacco: Never  Substance Use Topics   Alcohol use: No    Subjective:   Patient is here with concerns for about discoloration in his right leg/ sensation of being cold at night; does have known  vascular issues; symptoms have been present "for a while." Denies any chest pain, shortness of breath, blurred vision or headache.    Objective:  Vitals:   06/19/22 1305  BP: 120/72  Pulse: 67  Resp: 18  Temp: 98.3 F (36.8 C)  TempSrc: Oral  SpO2: 98%  Weight: 176 lb 6.4 oz (80 kg)  Height: 6\' 2"  (1.88 m)    General: Well developed, well nourished, in no acute distress  Skin : Warm and dry.  Head: Normocephalic and atraumatic  Lungs: Respirations unlabored; clear to auscultation bilaterally without wheeze, rales, rhonchi  CVS exam: normal rate and regular rhythm.  Extremities: edema, discoloration noted of right lower extremity Neurologic: Alert and oriented; speech intact; face symmetrical; moves all extremities well; CNII-XII intact without focal deficit   Assessment:  1. Discoloration of skin of lower leg     Plan:  Will refer to vein and vascular for further evaluation; he understands need to follow up at that office.   No follow-ups on file.  Orders Placed This Encounter  Procedures   Ambulatory referral to Vascular Surgery    Referral Priority:   Routine    Referral Type:   Surgical    Referral Reason:   Specialty Services Required    Requested  Specialty:   Vascular Surgery    Number of Visits Requested:   1    Requested Prescriptions    No prescriptions requested or ordered in this encounter

## 2022-07-08 ENCOUNTER — Telehealth: Payer: Self-pay

## 2022-07-08 NOTE — Telephone Encounter (Signed)
   Telephone encounter was:  Successful.  07/08/2022 Name: KEVON TENCH MRN: [id number] DOB: 06-11-1951  MACARTHUR LORUSSO is a 71 y.o. year old male who is a primary care patient of Olive Bass, FNP . The community resource team was consulted for assistance with Food Insecurity  Care guide performed the following interventions: Patient provided with information about care guide support team and interviewed to confirm resource needs.Patient requested i follow up with meals on wheels due to havent heard anything since March when i put him on the waiting list 3/29. I have called and waiting for a return call from [facility]   Follow Up Plan:  Care guide will follow up with patient by phone over the next DAY   Richelle Ito Guide, [facility], Care Management  [phone] [address] [place], [place], [place] [address] Phone: [phone] Email: [email or web address]

## 2022-07-09 ENCOUNTER — Other Ambulatory Visit: Payer: Self-pay | Admitting: *Deleted

## 2022-07-09 DIAGNOSIS — M79604 Pain in right leg: Secondary | ICD-10-CM

## 2022-07-14 NOTE — Progress Notes (Unsigned)
[facility]           [facility] [place]  History and Physical   Brandon Perkins is a 71 y.o. male who presents with ***  ***  The pt does *** have hx of previous venous procedures. The patient has *** history of DVT. Pt does *** history of varicose vein.   Pt does *** history of skin changes in lower legs.   There is *** family history of venous disorders.   The patient has *** used compression stockings in the past.    The pt is on a statin for cholesterol management.  The pt is not on a daily aspirin.   Other AC:  Plavix The pt is on CCB, ACEI for hypertension.   The pt is not diabetic.   Tobacco hx:  never  Pt does *** have family hx of AAA.  Past Medical History:  Diagnosis Date   Blood clot in vein    left arm vein blood clot, not know the detail, treated with ASA per pt   Cataract    OU   Hypertension    Hypertensive retinopathy    OU    No past surgical history on file.  Social History   Socioeconomic History   Marital status: Married    Spouse name: Not on file   Number of children: Not on file   Years of education: Not on file   Highest education level: Not on file  Occupational History   Not on file  Tobacco Use   Smoking status: Never   Smokeless tobacco: Never  Vaping Use   Vaping Use: Never used  Substance and Sexual Activity   Alcohol use: No   Drug use: No   Sexual activity: Not on file  Other Topics Concern   Not on file  Social History Narrative   Not on file   Social Determinants of Health   Financial Resource Strain: Not on file  Food Insecurity: Food Insecurity Present (10/29/2021)   Hunger Vital Sign    Worried About Running Out of Food in the Last Year: Sometimes true    Ran Out of Food in the Last Year: Sometimes true  Transportation Needs: Unmet Transportation Needs (06/19/2022)   PRAPARE - Administrator, Civil Service (Medical): Yes    Lack of Transportation (Non-Medical): Yes   Physical Activity: Not on file  Stress: Not on file  Social Connections: Not on file  Intimate Partner Violence: Not on file    *** Family History  Problem Relation Age of Onset   Dementia Mother    Colon cancer Father    Stroke Sister        Sister had brain aneurysm   Diabetes Brother     Current Outpatient Medications  Medication Sig Dispense Refill   amLODipine (NORVASC) 10 MG tablet Take 1 tablet (10 mg total) by mouth daily. 90 tablet 0   atorvastatin (LIPITOR) 40 MG tablet Take 1 tablet (40 mg total) by mouth daily. 90 tablet 0   chlorthalidone (HYGROTON) 25 MG tablet Take 1 tablet (25 mg total) by mouth daily. 90 tablet 0   clopidogrel (PLAVIX) 75 MG tablet Take 1 tablet (75 mg total) by mouth daily. 90 tablet 0   lisinopril (ZESTRIL) 5 MG tablet Take 1 tablet (5 mg total) by mouth daily. 90 tablet 0   No current facility-administered medications for this visit.    Allergies  Allergen Reactions   Aspirin     When takes a lot of aspirin-- reaction unknown    REVIEW OF SYSTEMS:  *** [X]  denotes positive finding, [ ]  denotes negative finding Cardiac  Comments:  Chest pain or chest pressure:    Shortness of breath upon exertion:    Short of breath when lying flat:    Irregular heart rhythm:        Vascular    Pain in calf, thigh, or hip brought on by ambulation:    Pain in feet at night that wakes you up from your sleep:     Blood clot in your veins:    Leg swelling:  x       Pulmonary    Oxygen at home:    Productive cough:     Wheezing:         Neurologic    Sudden weakness in arms or legs:     Sudden numbness in arms or legs:     Sudden onset of difficulty speaking or slurred speech:    Temporary loss of vision in one eye:     Problems with dizziness:         Gastrointestinal    Blood in stool:     Vomited blood:         Genitourinary    Burning when urinating:     Blood in urine:        Psychiatric    Major depression:          Hematologic    Bleeding problems:    Problems with blood clotting too easily:        Skin    Rashes or ulcers:        Constitutional    Fever or chills:      PHYSICAL EXAMINATION:  ***  General:  WDWN in NAD; vital signs documented above Gait: Not observed HENT: WNL, normocephalic Pulmonary: normal non-labored breathing without wheezing Cardiac: {Desc; regular/irreg:14544} HR; {With/Without:20273} carotid bruit*** Abdomen: soft, NT, aortic pulse is *** palpable Skin: {With/Without:20273} rashes Vascular Exam/Pulses:  Right Left  Radial {Exam; arterial pulse strength 0-4:30167} {Exam; arterial pulse strength 0-4:30167}  DP {Exam; arterial pulse strength 0-4:30167} {Exam; arterial pulse strength 0-4:30167}  PT {Exam; arterial pulse strength 0-4:30167} {Exam; arterial pulse strength 0-4:30167}   Extremities: ***  Neurologic: A&O X 3;  moving all extremities equally Psychiatric:  The pt has {Desc; normal/abnormal:11317::"Normal"} affect.   Non-Invasive Vascular Imaging:   Venous duplex on 07/16/2022: ***    Brandon Perkins is a 71 y.o. male who presents with: ***    -pt has *** pedal pulses -pt does *** have evidence of DVT.  Pt does ***have venous reflux *** -discussed with pt about wearing *** high *** mmHg compression stockings and pt was measured for these today.   *** -discussed the importance of leg elevation and how to elevate properly - pt is advised to elevate their legs and a diagram is given to them to demonstrate for pt to lay flat on their back with knees elevated and slightly bent with their feet higher than their knees, which puts their feet higher than their heart for 15 minutes per day.  If pt cannot lay flat, advised to lay as flat as possible.  -pt is advised to continue as much walking as possible and avoid sitting or standing for long periods of time.  -discussed importance of weight loss and exercise and that water aerobics would also be beneficial.   -  handout with recommendations given -pt will f/u ***   Doreatha Massed, [facility] [phone]  Clinic MD:  Edilia Bo

## 2022-07-16 ENCOUNTER — Ambulatory Visit (HOSPITAL_COMMUNITY)
Admission: RE | Admit: 2022-07-16 | Discharge: 2022-07-16 | Disposition: A | Discharge status: Home / Self Care | Payer: Medicare Other | Source: Ambulatory Visit | Attending: Vascular Surgery | Admitting: Vascular Surgery

## 2022-07-16 ENCOUNTER — Ambulatory Visit (INDEPENDENT_AMBULATORY_CARE_PROVIDER_SITE_OTHER): Payer: Medicare Other | Admitting: Physician Assistant

## 2022-07-16 VITALS — BP 152/72 | HR 59 | Temp 97.6°F | Ht 73.0 in | Wt 176.0 lb

## 2022-07-16 DIAGNOSIS — M79604 Pain in right leg: Secondary | ICD-10-CM | POA: Diagnosis present

## 2022-07-16 DIAGNOSIS — M7989 Other specified soft tissue disorders: Secondary | ICD-10-CM

## 2022-07-17 ENCOUNTER — Other Ambulatory Visit: Payer: Self-pay | Admitting: *Deleted

## 2022-07-17 DIAGNOSIS — M79604 Pain in right leg: Secondary | ICD-10-CM

## 2022-07-17 DIAGNOSIS — M7989 Other specified soft tissue disorders: Secondary | ICD-10-CM

## 2022-07-28 NOTE — Progress Notes (Signed)
HISTORY AND PHYSICAL     CC:  follow up. Requesting Provider:  Olive Bass,*  HPI: This is a 71 y.o. male who is here today for follow up after evaluation for his leg feeling cold and swelling.  He was in the hospital earlier this year and needed blankets to cover his right leg as it was cold.  He has injury to his right ankle probably back in the 70's when he fell out of a deer stand and had swelling ever since then.  He had worn compression in the past when it happened but not since then.  He walks with a cane.    Pt was last seen 07/16/2022 and at that time, he was not having any claudication, rest pain or non healing wounds, but did not have palpable pulses and was brought back to get ABI to get baseline.  He was compliant with his asa/stain/plavix.   The pt returns today for follow up.  He states he has been doing well.  He denies any pain in his feet at night.  He does state that he lives alone and sometimes it feels like someone walks by and hits his foot but denies frank pain in his foot.  He does not have non healing wounds.  He denies any claudication.  He tells me that when he injured his leg in the 70's he had what he describes as an external fixator on his leg.  He states that the swelling in his leg is somewhat better.    The pt is on a statin for cholesterol management.  The pt is not on a daily aspirin.   Other AC:  Plavix The pt is on CCB, ACEI for hypertension.   The pt is not diabetic.   Tobacco hx:  never   Pt does not have family hx of AAA.  Past Medical History:  Diagnosis Date   Blood clot in vein    left arm vein blood clot, not know the detail, treated with ASA per pt   Cataract    OU   Hypertension    Hypertensive retinopathy    OU    No past surgical history on file.  Allergies  Allergen Reactions   Aspirin     When takes a lot of aspirin-- reaction unknown    Current Outpatient Medications  Medication Sig Dispense Refill   amLODipine  (NORVASC) 10 MG tablet Take 1 tablet (10 mg total) by mouth daily. 90 tablet 0   atorvastatin (LIPITOR) 40 MG tablet Take 1 tablet (40 mg total) by mouth daily. 90 tablet 0   chlorthalidone (HYGROTON) 25 MG tablet Take 1 tablet (25 mg total) by mouth daily. 90 tablet 0   clopidogrel (PLAVIX) 75 MG tablet Take 1 tablet (75 mg total) by mouth daily. 90 tablet 0   lisinopril (ZESTRIL) 5 MG tablet Take 1 tablet (5 mg total) by mouth daily. 90 tablet 0   No current facility-administered medications for this visit.    Family History  Problem Relation Age of Onset   Dementia Mother    Colon cancer Father    Stroke Sister        Sister had brain aneurysm   Diabetes Brother     Social History   Socioeconomic History   Marital status: Married    Spouse name: Not on file   Number of children: Not on file   Years of education: Not on file   Highest education level: Not on  file  Occupational History   Not on file  Tobacco Use   Smoking status: Never   Smokeless tobacco: Never  Vaping Use   Vaping Use: Never used  Substance and Sexual Activity   Alcohol use: No   Drug use: No   Sexual activity: Not on file  Other Topics Concern   Not on file  Social History Narrative   Not on file   Social Determinants of Health   Financial Resource Strain: Not on file  Food Insecurity: Food Insecurity Present (10/29/2021)   Hunger Vital Sign    Worried About Running Out of Food in the Last Year: Sometimes true    Ran Out of Food in the Last Year: Sometimes true  Transportation Needs: Unmet Transportation Needs (06/19/2022)   PRAPARE - Hydrologist (Medical): Yes    Lack of Transportation (Non-Medical): Yes  Physical Activity: Not on file  Stress: Not on file  Social Connections: Not on file  Intimate Partner Violence: Not on file     REVIEW OF SYSTEMS:   [X]  denotes positive finding, [ ]  denotes negative finding Cardiac  Comments:  Chest pain or chest  pressure:    Shortness of breath upon exertion:    Short of breath when lying flat:    Irregular heart rhythm:        Vascular    Pain in calf, thigh, or hip brought on by ambulation:    Pain in feet at night that wakes you up from your sleep:     Blood clot in your veins:    Leg swelling:         Pulmonary    Oxygen at home:    Productive cough:     Wheezing:         Neurologic    Sudden weakness in arms or legs:     Sudden numbness in arms or legs:     Sudden onset of difficulty speaking or slurred speech:    Temporary loss of vision in one eye:     Problems with dizziness:         Gastrointestinal    Blood in stool:     Vomited blood:         Genitourinary    Burning when urinating:     Blood in urine:        Psychiatric    Major depression:         Hematologic    Bleeding problems:    Problems with blood clotting too easily:        Skin    Rashes or ulcers:        Constitutional    Fever or chills:      PHYSICAL EXAMINATION:  Today's Vitals   07/31/22 0847  BP: (!) 142/73  Pulse: (!) 58  Resp: 20  Temp: (!) 97.4 F (36.3 C)  TempSrc: Temporal  SpO2: 97%  Weight: 175 lb 11.2 oz (79.7 kg)  Height: 6\' 1"  (1.854 m)   Body mass index is 23.18 kg/m.   General:  WDWN in NAD; vital signs documented above Gait: Not observed HENT: WNL, normocephalic Pulmonary: normal non-labored breathing , without wheezing Cardiac: regular HR, without carotid bruits Skin: without rashes Vascular Exam/Pulses: Bilateral radial pulses are palpable His pedal pulses are not palpable Extremities: still with some RLE swelling; well healed incision medial ankle.  Bilateral feet cool to touch.  Musculoskeletal: no muscle wasting or atrophy  Neurologic: A&O  X 3 Psychiatric:  The pt has Normal affect.   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      ASSESSMENT/PLAN:: 71 y.o. male here for follow up after appt earlier this month to check ABI   -pt does have ABI of 0.7 on the right with dampened monophasic flow.  He does not have any rest pain, non healing wounds or claudication and therefore, no intervention.   Discussed with him the importance of protecting his feet to prevent wounds on his feet.  Discussed that he will come back in one year to recheck his ABI but stressed to him that if he develops non healing or slow healing wounds on his feet or rest pain, he needs to call us sooner as we may need to do angiogram if those things were to occur.  He expressed understanding.   -continue statin and plavix    Leontine Locket, [facility] [phone]  Clinic MD:   Virl Cagey

## 2022-07-31 ENCOUNTER — Ambulatory Visit (INDEPENDENT_AMBULATORY_CARE_PROVIDER_SITE_OTHER): Payer: Medicare Other | Admitting: Physician Assistant

## 2022-07-31 ENCOUNTER — Ambulatory Visit (HOSPITAL_COMMUNITY)
Admission: RE | Admit: 2022-07-31 | Discharge: 2022-07-31 | Disposition: A | Discharge status: Home / Self Care | Payer: Medicare Other | Source: Ambulatory Visit | Attending: Vascular Surgery | Admitting: Vascular Surgery

## 2022-07-31 VITALS — BP 142/73 | HR 58 | Temp 97.4°F | Resp 20 | Ht 73.0 in | Wt 175.7 lb

## 2022-07-31 DIAGNOSIS — M7989 Other specified soft tissue disorders: Secondary | ICD-10-CM | POA: Diagnosis present

## 2022-07-31 DIAGNOSIS — M79604 Pain in right leg: Secondary | ICD-10-CM | POA: Diagnosis present

## 2022-07-31 DIAGNOSIS — I739 Peripheral vascular disease, unspecified: Secondary | ICD-10-CM

## 2022-08-13 ENCOUNTER — Other Ambulatory Visit: Payer: Self-pay | Admitting: Family

## 2023-01-19 ENCOUNTER — Telehealth: Payer: Self-pay | Admitting: Family

## 2023-01-19 NOTE — Telephone Encounter (Signed)
Patient has called stating that he needs his medication refilled through [facility]. He would like all of his medication refilled and sent through. Normally [facility] calls but they have not called the pt yet and he wanted to let us know.

## 2023-01-20 NOTE — Telephone Encounter (Signed)
Patient will call pharmacy to see what is going on.

## 2023-04-08 ENCOUNTER — Other Ambulatory Visit: Payer: Self-pay | Admitting: Family

## 2023-04-09 ENCOUNTER — Other Ambulatory Visit: Payer: Self-pay | Admitting: Family

## 2023-06-08 ENCOUNTER — Other Ambulatory Visit: Payer: Self-pay | Admitting: Family

## 2023-06-17 ENCOUNTER — Other Ambulatory Visit: Payer: Self-pay | Admitting: Family

## 2023-06-18 ENCOUNTER — Telehealth: Payer: Self-pay | Admitting: Family

## 2023-06-18 NOTE — Telephone Encounter (Signed)
Please mail him a letter that he is overdue for OV. No further refills can be given after the set I just sent in for him. Does not use MyChart and has been hard to reach on the phone in the past.

## 2023-06-18 NOTE — Telephone Encounter (Signed)
Letter has been printed and placed up front to mail out to pt.

## 2023-08-09 ENCOUNTER — Other Ambulatory Visit: Payer: Self-pay | Admitting: Family

## 2023-08-26 ENCOUNTER — Other Ambulatory Visit: Payer: Self-pay | Admitting: Family

## 2023-08-28 ENCOUNTER — Other Ambulatory Visit: Payer: Self-pay | Admitting: Family

## 2023-09-18 ENCOUNTER — Other Ambulatory Visit: Payer: Self-pay | Admitting: Family

## 2023-09-22 ENCOUNTER — Encounter (HOSPITAL_COMMUNITY): Payer: Self-pay

## 2023-09-22 ENCOUNTER — Ambulatory Visit (HOSPITAL_COMMUNITY)
Admission: EM | Admit: 2023-09-22 | Discharge: 2023-09-22 | Disposition: A | Discharge status: Home / Self Care | Payer: Medicare Other | Attending: Sports Medicine | Admitting: Sports Medicine

## 2023-09-22 ENCOUNTER — Encounter (HOSPITAL_COMMUNITY): Payer: Self-pay | Admitting: *Deleted

## 2023-09-22 ENCOUNTER — Emergency Department (HOSPITAL_COMMUNITY): Payer: Medicare Other

## 2023-09-22 ENCOUNTER — Emergency Department (HOSPITAL_COMMUNITY)
Admission: EM | Admit: 2023-09-22 | Discharge: 2023-09-22 | Disposition: A | Discharge status: Home / Self Care | Payer: Medicare Other | Attending: Emergency Medicine | Admitting: Emergency Medicine

## 2023-09-22 ENCOUNTER — Other Ambulatory Visit: Payer: Self-pay

## 2023-09-22 DIAGNOSIS — R079 Chest pain, unspecified: Secondary | ICD-10-CM | POA: Diagnosis present

## 2023-09-22 DIAGNOSIS — R0602 Shortness of breath: Secondary | ICD-10-CM | POA: Diagnosis not present

## 2023-09-22 LAB — BASIC METABOLIC PANEL
Anion gap: 11 (ref 5–15)
BUN: 11 mg/dL (ref 8–23)
CO2: 25 mmol/L (ref 22–32)
Calcium: 8.9 mg/dL (ref 8.9–10.3)
Chloride: 100 mmol/L (ref 98–111)
Creatinine, Ser: 1.15 mg/dL (ref 0.61–1.24)
GFR, Estimated: >60 mL/min (ref >60)
Glucose, Bld: 95 mg/dL (ref 70–99)
Potassium: 3.7 mmol/L (ref 3.5–5.1)
Sodium: 136 mmol/L (ref 135–145)

## 2023-09-22 LAB — CBC
HCT: 40.6 % (ref 39.0–52.0)
Hemoglobin: 12.8 g/dL — ABNORMAL LOW (ref 13.0–17.0)
MCH: 26.6 pg (ref 26.0–34.0)
MCHC: 31.5 g/dL (ref 30.0–36.0)
MCV: 84.4 fL (ref 80.0–100.0)
Platelets: 215 10*3/uL (ref 150–400)
RBC: 4.81 MIL/uL (ref 4.22–5.81)
RDW: 16.3 % — ABNORMAL HIGH (ref 11.5–15.5)
WBC: 6 10*3/uL (ref 4.0–10.5)
nRBC: 0 % (ref 0.0–0.2)

## 2023-09-22 LAB — TROPONIN I (HIGH SENSITIVITY)
Troponin I (High Sensitivity): 10 ng/L (ref <18)
Troponin I (High Sensitivity): 16 ng/L (ref <18)

## 2023-09-22 MED ORDER — CHLORTHALIDONE 25 MG PO TABS
25.0000 mg | ORAL_TABLET | Freq: Every day | ORAL | 0 refills | Status: DC
Start: 2023-09-22 — End: 2023-10-21

## 2023-09-22 MED ORDER — CLOPIDOGREL BISULFATE 75 MG PO TABS: 75.0000 mg | ORAL_TABLET | Freq: Every day | ORAL | 0 refills | Status: DC

## 2023-09-22 MED ORDER — ASPIRIN 81 MG PO CHEW
CHEWABLE_TABLET | ORAL | Status: AC
  Filled 2023-09-22: qty 1

## 2023-09-22 MED ORDER — ASPIRIN 81 MG PO CHEW
324.0000 mg | CHEWABLE_TABLET | Freq: Once | ORAL | Status: AC
  Administered 2023-09-22: 324 mg via ORAL

## 2023-09-22 MED ORDER — AMLODIPINE BESYLATE 10 MG PO TABS: 10.0000 mg | ORAL_TABLET | Freq: Every day | ORAL | 0 refills | Status: AC

## 2023-09-22 MED ORDER — LISINOPRIL 5 MG PO TABS: 5.0000 mg | ORAL_TABLET | Freq: Every day | ORAL | 0 refills | Status: AC

## 2023-09-22 MED ORDER — ATORVASTATIN CALCIUM 40 MG PO TABS: 40.0000 mg | ORAL_TABLET | Freq: Every day | ORAL | 0 refills | Status: AC

## 2023-09-22 NOTE — Discharge Instructions (Signed)
Your work up today was normal, with no signs of heart or lung problems. Your pain could be musculoskeletal. Treat with over the counter medications for symptoms control and follow up with primary doctor. If you have worsening symptoms, shortness of breath, coughing up blood, severe back pain please return to an ER for evaluation.

## 2023-09-22 NOTE — ED Triage Notes (Signed)
PT reports CP on Lt that started 3 days ago. Pt reports the pain feels like it startes in his back and moves to Lt chest. Pt denies any chest injury. P thas been displaced form his home after a tree fell on it. Pt's Family would not let him back in their home ,so Pt has been staying at the [facility] fo r3 days.

## 2023-09-22 NOTE — ED Notes (Signed)
 Pt was stuck twice. Wasn't successful.

## 2023-09-22 NOTE — ED Notes (Signed)
Pt d/c home per EDP order. Discharge summary reviewed, pt verbalizes understanding. NAD.

## 2023-09-22 NOTE — ED Triage Notes (Signed)
PT has not had his meds for 3 weeks.

## 2023-09-22 NOTE — ED Triage Notes (Signed)
Pt. Stated, Brandon Perkins had some chest pain with some now and then SOB. Denies any other symptoms.

## 2023-09-22 NOTE — ED Provider Triage Note (Signed)
Emergency Medicine Provider Triage Evaluation Note  Brandon Perkins , a 73 y.o. male  was evaluated in triage.  Pt complains of left-sided chest pain, associated with shortness of breath, intermittent ongoing for the past couple days.  Review of Systems  Positive: Chest pain, shortness of breath Negative: Back pain, abdominal pain, hemoptysis, fever  Physical Exam  BP (!) 184/91   Pulse 62   Temp (!) 97.4 F (36.3 C) (Oral)   Resp 18   Ht 6\' 2"  (1.88 m)   SpO2 96%   BMI 22.56 kg/m  Gen:   Awake, no distress   Resp:  Normal effort  MSK:   Moves extremities without difficulty    Medical Decision Making  Medically screening exam initiated at 11:34 AM.  Appropriate orders placed.  Brandon Perkins was informed that the remainder of the evaluation will be completed by another provider, this initial triage assessment does not replace that evaluation, and the importance of remaining in the ED until their evaluation is complete.  73 year old male here with intermittent chest pain for the past couple days.  Currently resolved after getting a dose of aspirin in urgent care.  EKG is unchanged for the patient, currently chest pain-free, will plan for further evaluation and monitoring.  Vitals normal.  Patient was counseled that they need to remain in the ED until the completion of their work-up including a full H&P and results of any tests.  It was explained to them that the risk of leaving the emergency department prior to completion of this treatment could be very dangerous to their health.  The patient appears stable and the remainder of the encounter may be completed by another provider.   Brandon Logan, DO 09/22/23 1135

## 2023-09-22 NOTE — ED Provider Notes (Signed)
[facility]    CSN: [id number] Arrival date & time: 09/22/23  8299      History   Chief Complaint Chief Complaint  Patient presents with   Chest Pain    HPI Brandon Perkins is a 73 y.o. male.   He has history of HTN, PAD, RBBB, and CVA presenting 3 days of left sided chest pain. He reports it is short lasting, ranging from 5-10 minutes and resolves. It radiates from the front to the back of his chest. Feels some shortness of breath with it. Improves when laying supine. Doesn't notice if it is worse with exertion or not. He has swelling in his right ankle that he states is chronic and related to a previous injury. He has been without his medications for the past 3 weeks. He denies history of heart attack. Is a former smoker, though quit many years ago.     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 No pertinent surgical history.     Home Medications    Prior to Admission medications   Medication Sig Start Date End Date Taking? Authorizing Provider  amLODipine (NORVASC) 10 MG tablet TAKE 1 TABLET BY MOUTH DAILY 08/27/23  Yes Olive Bass,  FNP  atorvastatin (LIPITOR) 40 MG tablet TAKE 1 TABLET BY MOUTH DAILY 08/27/23  Yes Olive Bass, FNP  chlorthalidone (HYGROTON) 25 MG tablet TAKE 1 TABLET BY MOUTH DAILY 06/08/23  Yes Olive Bass, FNP  clopidogrel (PLAVIX) 75 MG tablet TAKE 1 TABLET BY MOUTH DAILY 08/30/23  Yes Olive Bass, FNP  lisinopril (ZESTRIL) 5 MG tablet TAKE 1 TABLET BY MOUTH DAILY 08/27/23  Yes Olive Bass, FNP    Family History Family History  Problem Relation Age of Onset   Dementia Mother    Colon cancer Father    Stroke Sister        Sister had brain aneurysm   Diabetes Brother     Social History Social History   Tobacco Use   Smoking status: Never    Passive exposure: Never   Smokeless tobacco: Never  Vaping Use   Vaping status: Never Used  Substance Use Topics   Alcohol use: No   Drug use: No     Allergies   Aspirin   Review of Systems Review of Systems  Respiratory:  Positive for shortness of breath. Negative for cough and chest tightness.   Cardiovascular:  Positive for chest pain and leg swelling. Negative for palpitations.     Physical Exam Triage Vital Signs ED Triage Vitals  Encounter  Vitals Group     BP 09/22/23 0921 (!) 165/86     Systolic BP Percentile --      Diastolic BP Percentile --      Pulse Rate 09/22/23 0921 62     Resp 09/22/23 0921 20     Temp 09/22/23 0924 (!) 97.3 F (36.3 C)     Temp src --      SpO2 09/22/23 0921 100 %     Weight --      Height --      Head Circumference --      Peak Flow --      Pain Score 09/22/23 0912 5     Pain Loc --      Pain Education --      Exclude from Growth Chart --    No data found.  Updated Vital Signs BP (!) 165/86   Pulse 62   Temp (!) 97.3 F (36.3 C)   Resp 20   SpO2 100%   Physical Exam Constitutional:      General: He is not in acute distress.    Appearance: He is normal weight. He is not toxic-appearing or diaphoretic.  HENT:     Head: Normocephalic and  atraumatic.  Eyes:     Extraocular Movements: Extraocular movements intact.     Pupils: Pupils are equal, round, and reactive to light.  Neck:     Vascular: No JVD.  Cardiovascular:     Rate and Rhythm: Regular rhythm. Bradycardia present.     Pulses:          Carotid pulses are 2+ on the right side and 2+ on the left side.      Radial pulses are 2+ on the right side and 2+ on the left side.       Dorsalis pedis pulses are 2+ on the right side and 2+ on the left side.       Posterior tibial pulses are 2+ on the right side and 2+ on the left side.     Heart sounds: Murmur heard.     Diastolic murmur is present with a grade of 2/4.     No friction rub. No S3 sounds.  Pulmonary:     Effort: Pulmonary effort is normal.     Breath sounds: Decreased breath sounds present. No wheezing, rhonchi or rales.  Chest:     Chest wall: No tenderness, crepitus or edema.  Abdominal:     General: Bowel sounds are normal.     Palpations: Abdomen is soft. There is no mass.     Tenderness: There is no abdominal tenderness. There is no guarding or rebound.  Musculoskeletal:     Cervical back: Normal range of motion and neck supple.     Right lower leg: Edema present.     Left lower leg: Edema present.     Comments: Right ankle with more swelling compared to left and old surgical scars. Pitting 2+.  Skin:    Capillary Refill: Capillary refill takes less than 2 seconds.  Neurological:     General: No focal deficit present.     Mental Status: He is alert and oriented to person, place, and time.  Psychiatric:        Mood and Affect: Mood normal.        Behavior: Behavior normal.      UC Treatments / Results  Labs (all labs ordered are listed, but only abnormal results are displayed) Labs Reviewed -  No data to display  EKG Reviewed and shows sinus bradycardia HR 51bpm and RBBB (similar to EKG reviewed in the system form 06/04/2021). No ST segment changes concerning for STEMI or  NSTEMI.  Radiology No results found.  Procedures Procedures (including critical care time)  Medications Ordered in UC Medications  aspirin chewable tablet 324 mg (has no administration in time range)    Initial Impression / Assessment and Plan / UC Course  I have reviewed the triage vital signs and the nursing notes.  Pertinent labs & imaging results that were available during my care of the patient were reviewed by me and considered in my medical decision making (see chart for details).    Vitals and triage reviewed. He is vitally stable. With his history I have concern for unstable angina, he has no acute findings of ischemia on EKG today. No reproducible chest wall pain.  I do believe he needs more expedited work up in the form of cardiac enzymes and longer monitoring than we are able to provide in the urgent care setting. I recommended transfer to the ED and the patient and his son were amenable to this. He was given ASA 324mg  prior to discharge from the UC.  Final Clinical Impressions(s) / UC Diagnoses   Final diagnoses:  Chest pain, unspecified type   Discharge Instructions   None    ED Prescriptions   None    PDMP not reviewed this encounter.   Marisa Cyphers, MD 09/22/23 1039

## 2023-09-22 NOTE — ED Notes (Signed)
Patient is being discharged from the Urgent Care and sent to the Emergency Department via POV . Per DR. Currieo, patient is in need of higher level of care due to chest pain. Patient is aware and verbalizes understanding of plan of care.  Vitals:   09/22/23 0921 09/22/23 0924  BP: (!) 165/86   Pulse: 62   Resp: 20   Temp:  (!) 97.3 F (36.3 C)  SpO2: 100%

## 2023-09-22 NOTE — ED Provider Notes (Signed)
[place] [facility]ENCY DEPARTMENT AT [facility] Provider Note   CSN: [id number] Arrival date & time: 09/22/23  1041     History  Chief Complaint  Patient presents with   Chest Pain   Shortness of Breath    Brandon Perkins is a 72 y.o. male.  HPI   73 year old male presents to the emergency department with concern for left-sided chest pain.  Patient states that this has been going on for the past 3 days.  He describes the pain as intermittent, sharp, associated with shortness of breath, lasted couple minutes and self resolves.  On my exam he is currently chest pain-free.  He denies any back pain, fever, cough, hemoptysis, lower extremity swelling.  Home Medications Prior to Admission medications   Medication Sig Start Date End Date Taking? Authorizing Provider  amLODipine (NORVASC) 10 MG tablet TAKE 1 TABLET BY MOUTH DAILY 08/27/23   Olive Bass, FNP  atorvastatin (LIPITOR) 40 MG tablet TAKE 1 TABLET BY MOUTH DAILY 08/27/23   Olive Bass, FNP  chlorthalidone (HYGROTON) 25 MG tablet TAKE 1 TABLET BY MOUTH DAILY 06/08/23   Olive Bass, FNP  clopidogrel (PLAVIX) 75 MG tablet TAKE 1 TABLET BY MOUTH DAILY 08/30/23   Olive Bass, FNP  lisinopril (ZESTRIL) 5 MG tablet TAKE 1 TABLET BY MOUTH DAILY 08/27/23   Olive Bass, FNP      Allergies    Aspirin    Review of Systems   Review of Systems  Constitutional:  Negative for fever.  Respiratory:  Positive for shortness of breath. Negative for cough and chest tightness.   Cardiovascular:  Positive for chest pain. Negative for palpitations and leg swelling.  Gastrointestinal:  Negative for abdominal pain, diarrhea and vomiting.  Genitourinary:  Negative for flank pain.  Musculoskeletal:  Negative for back pain.  Skin:  Negative for rash.  Neurological:  Negative for headaches.    Physical Exam Updated Vital Signs BP (!) 168/85   Pulse (!) 52   Temp (!) 97.4 F (36.3 C)  (Oral)   Resp 12   Ht 6\' 2"  (1.88 m)   SpO2 100%   BMI 22.56 kg/m  Physical Exam Vitals and nursing note reviewed.  Constitutional:      General: He is not in acute distress.    Appearance: Normal appearance.  HENT:     Head: Normocephalic.     Mouth/Throat:     Mouth: Mucous membranes are moist.  Cardiovascular:     Rate and Rhythm: Normal rate.  Pulmonary:     Effort: Pulmonary effort is normal. No respiratory distress.  Abdominal:     Palpations: Abdomen is soft.     Tenderness: There is no abdominal tenderness.  Musculoskeletal:     Right lower leg: No edema.     Left lower leg: No edema.  Skin:    General: Skin is warm.  Neurological:     Mental Status: He is alert and oriented to person, place, and time. Mental status is at baseline.  Psychiatric:        Mood and Affect: Mood normal.     ED Results / Procedures / Treatments   Labs (all labs ordered are listed, but only abnormal results are displayed) Labs Reviewed  CBC - Abnormal; Notable for the following components:      Result Value   Hemoglobin 12.8 (*)    RDW 16.3 (*)    All other components within normal limits  BASIC METABOLIC  PANEL  TROPONIN I (HIGH SENSITIVITY)  TROPONIN I (HIGH SENSITIVITY)    EKG EKG Interpretation Date/Time:  Wednesday September 22 2023 10:51:34 EST Ventricular Rate:  59 PR Interval:  186 QRS Duration:  162 QT Interval:  468 QTC Calculation: 463 R Axis:   -22  Text Interpretation: Sinus bradycardia with occasional Premature ventricular complexes Right bundle branch block Abnormal ECG When compared with ECG of 22-Sep-2023 09:31, PREVIOUS ECG IS PRESENT no change Confirmed by Coralee Pesa [phone]) on 09/22/2023 11:04:04 AM  Radiology DG Chest 2 View Result Date: 09/22/2023 CLINICAL DATA:  Chest pain EXAM: CHEST - 2 VIEW COMPARISON:  Chest radiograph 12/24/2019 FINDINGS: Stable cardiac and mediastinal contours. No large area pulmonary consolidation. No pleural effusion or  pneumothorax. Lucency under the diaphragms bilaterally favored to represent gaseous distended colon. IMPRESSION: 1. No active cardiopulmonary disease. 2. Lucency under the hemidiaphragms bilaterally favored to represent gaseous distended colon. Alternative etiologies not excluded. Recommend dedicated abdominal radiography for further evaluation. Electronically Signed   By: Annia Belt M.D.   On: 09/22/2023 11:59    Procedures Procedures    Medications Ordered in ED Medications - No data to display  ED Course/ Medical Decision Making/ A&P                                 Medical Decision Making Amount and/or Complexity of Data Reviewed Labs: ordered. Radiology: ordered.   73 year old male presents emergency department with intermittent sharp left-sided chest pain associated with shortness of breath, self resolving.  Ongoing for the last couple days, currently chest pain-free.  Vitals are normal on arrival.  He is sitting in the bed, smiling and conversational.  EKG is similar to previous without acute changes.  Initial blood work is reassuring with a negative troponin.  Chest x-ray is unremarkable, does mention some subdiaphragmatic air that appears to be within the bowel, but cannot exclude other pathology.  Abdominal exam is benign and reassuring, I doubt perforation.  However will obtain a KUB just for further identification.  No free air on KUB.  Repeat troponin is negative.  No signs of ACS, doubt PE and dissection, he is currently pain-free with no respiratory symptoms.  Will plan for outpatient follow-up.  Patient at this time appears safe and stable for discharge and close outpatient follow up. Discharge plan and strict return to ED precautions discussed, patient verbalizes understanding and agreement.        Final Clinical Impression(s) / ED Diagnoses Final diagnoses:  None    Rx / DC Orders ED Discharge Orders     None         [name], DO 09/22/23  1643

## 2023-09-22 NOTE — ED Notes (Signed)
Got patient on the monitor patient is resting with call bell in reach  ?

## 2023-09-30 ENCOUNTER — Other Ambulatory Visit: Payer: Self-pay | Admitting: Family

## 2023-10-09 ENCOUNTER — Emergency Department (HOSPITAL_COMMUNITY)
Admission: EM | Admit: 2023-10-09 | Discharge: 2023-10-09 | Disposition: A | Discharge status: Home / Self Care | Attending: Emergency Medicine | Admitting: Emergency Medicine

## 2023-10-09 ENCOUNTER — Other Ambulatory Visit: Payer: Self-pay

## 2023-10-09 ENCOUNTER — Encounter (HOSPITAL_COMMUNITY): Payer: Self-pay

## 2023-10-09 DIAGNOSIS — R5381 Other malaise: Secondary | ICD-10-CM | POA: Diagnosis present

## 2023-10-09 DIAGNOSIS — I252 Old myocardial infarction: Secondary | ICD-10-CM | POA: Insufficient documentation

## 2023-10-09 DIAGNOSIS — Z8673 Personal history of transient ischemic attack (TIA), and cerebral infarction without residual deficits: Secondary | ICD-10-CM | POA: Diagnosis not present

## 2023-10-09 DIAGNOSIS — Z5900 Homelessness unspecified: Secondary | ICD-10-CM | POA: Diagnosis not present

## 2023-10-09 DIAGNOSIS — R45851 Suicidal ideations: Secondary | ICD-10-CM | POA: Diagnosis not present

## 2023-10-09 DIAGNOSIS — F32A Depression, unspecified: Secondary | ICD-10-CM | POA: Diagnosis not present

## 2023-10-09 NOTE — Discharge Instructions (Addendum)
 You were seen for your housing concerns.  You will be contacted by social work about trying to arrange for housing.  Please stay with a family member at a shelter for the next few days while we are arranging this.  Follow-up with your primary doctor in 2-3 days regarding your visit.    Return immediately to the emergency department if you experience any of the following: Thoughts of self-harm, or any other concerning symptoms.    Thank you for visiting our Emergency Department. It was a pleasure taking care of you today.

## 2023-10-09 NOTE — ED Triage Notes (Signed)
 Pt BIB EMS with reports of housing issues. Brandon Perkins [phone].

## 2023-10-09 NOTE — Progress Notes (Signed)
 TOC consulted for shelter resources and case management. Shelter and DSS resources were already attached to discharge paperwork. No additional TOC needs.
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
## 2023-10-21 ENCOUNTER — Telehealth: Payer: Self-pay | Admitting: Family

## 2023-10-21 ENCOUNTER — Other Ambulatory Visit: Payer: Self-pay | Admitting: Family

## 2023-10-21 NOTE — Telephone Encounter (Signed)
 Please call him and let him know due for an OV. We sent a letter in November. Unfortunately cannot continue to refill medications without OV.

## 2023-10-28 ENCOUNTER — Telehealth: Payer: Self-pay | Admitting: Family

## 2023-10-28 ENCOUNTER — Other Ambulatory Visit: Payer: Self-pay | Admitting: Family

## 2023-10-28 NOTE — Telephone Encounter (Signed)
 Called pt was not able to leave a VM sue to VM being full.

## 2023-10-28 NOTE — Telephone Encounter (Signed)
 Copied from CRM [id number]. Topic: General - Transportation >> Oct 28, 2023  1:06 PM Tiffany S wrote: Reason for CRM: Patient has appt with NP Ria Clock on April 4 820am patient needs transportation. Patient stated he is in a hotel right now he said to call him before setting up transportation

## 2023-10-28 NOTE — Telephone Encounter (Signed)
 FYI- I called pt, mailbox was full. Will try to call again to set up transportation.

## 2023-11-05 ENCOUNTER — Telehealth: Payer: Self-pay

## 2023-11-05 ENCOUNTER — Ambulatory Visit: Admitting: Family
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
## 2023-11-09 ENCOUNTER — Ambulatory Visit: Admitting: Family

## 2023-11-09 ENCOUNTER — Telehealth: Payer: Self-pay | Admitting: Family

## 2023-11-09 NOTE — Telephone Encounter (Signed)
 Copied from CRM [id number]. Topic: General - Transportation >> Nov 09, 2023  8:59 AM Elle L wrote: Reason for CRM: The patient missed his appointment due to not having transportation and he is requesting transportation assistance. The patient's call back number is [phone].

## 2023-11-09 NOTE — Telephone Encounter (Signed)
 Called pt back, was not able to leave a VM due to mailbox being full.
